# Patient Record
Sex: Female | Born: 1960
Health system: Southern US, Community
[De-identification: ages and names within clinical notes are randomized; demographics above are authoritative.]

## PROBLEM LIST (undated history)

## (undated) DIAGNOSIS — F419 Anxiety disorder, unspecified: Secondary | ICD-10-CM

## (undated) DIAGNOSIS — K589 Irritable bowel syndrome without diarrhea: Secondary | ICD-10-CM

## (undated) DIAGNOSIS — K449 Diaphragmatic hernia without obstruction or gangrene: Secondary | ICD-10-CM

## (undated) DIAGNOSIS — IMO0002 Reserved for concepts with insufficient information to code with codable children: Secondary | ICD-10-CM

## (undated) DIAGNOSIS — D126 Benign neoplasm of colon, unspecified: Secondary | ICD-10-CM

## (undated) DIAGNOSIS — T7840XA Allergy, unspecified, initial encounter: Secondary | ICD-10-CM

## (undated) DIAGNOSIS — J329 Chronic sinusitis, unspecified: Secondary | ICD-10-CM

## (undated) DIAGNOSIS — I10 Essential (primary) hypertension: Secondary | ICD-10-CM

## (undated) DIAGNOSIS — E669 Obesity, unspecified: Secondary | ICD-10-CM

## (undated) DIAGNOSIS — M1990 Unspecified osteoarthritis, unspecified site: Secondary | ICD-10-CM

## (undated) DIAGNOSIS — E785 Hyperlipidemia, unspecified: Secondary | ICD-10-CM

## (undated) DIAGNOSIS — K219 Gastro-esophageal reflux disease without esophagitis: Secondary | ICD-10-CM

## (undated) HISTORY — DX: Chronic sinusitis, unspecified: J32.9

## (undated) HISTORY — PX: POLYPECTOMY: SHX149

## (undated) HISTORY — DX: Anxiety disorder, unspecified: F41.9

## (undated) HISTORY — PX: COLONOSCOPY: SHX174

## (undated) HISTORY — DX: Gastro-esophageal reflux disease without esophagitis: K21.9

## (undated) HISTORY — DX: Essential (primary) hypertension: I10

## (undated) HISTORY — DX: Allergy, unspecified, initial encounter: T78.40XA

## (undated) HISTORY — DX: Diaphragmatic hernia without obstruction or gangrene: K44.9

## (undated) HISTORY — DX: Irritable bowel syndrome, unspecified: K58.9

## (undated) HISTORY — DX: Hyperlipidemia, unspecified: E78.5

## (undated) HISTORY — DX: Unspecified osteoarthritis, unspecified site: M19.90

## (undated) HISTORY — DX: Reserved for concepts with insufficient information to code with codable children: IMO0002

## (undated) HISTORY — DX: Benign neoplasm of colon, unspecified: D12.6

## (undated) HISTORY — DX: Obesity, unspecified: E66.9

---

## 2000-05-31 ENCOUNTER — Other Ambulatory Visit: Admission: RE | Admit: 2000-05-31 | Discharge: 2000-05-31 | Payer: Self-pay | Admitting: Obstetrics & Gynecology

## 2002-09-05 ENCOUNTER — Other Ambulatory Visit: Admission: RE | Admit: 2002-09-05 | Discharge: 2002-09-05 | Payer: Self-pay | Admitting: Obstetrics & Gynecology

## 2005-03-02 ENCOUNTER — Other Ambulatory Visit: Admission: RE | Admit: 2005-03-02 | Discharge: 2005-03-02 | Payer: Self-pay | Admitting: Family Medicine

## 2006-07-14 ENCOUNTER — Ambulatory Visit: Payer: Self-pay | Admitting: Gastroenterology

## 2006-08-29 ENCOUNTER — Other Ambulatory Visit: Admission: RE | Admit: 2006-08-29 | Discharge: 2006-08-29 | Payer: Self-pay | Admitting: Family Medicine

## 2006-09-13 ENCOUNTER — Ambulatory Visit: Payer: Self-pay | Admitting: Gastroenterology

## 2006-09-14 ENCOUNTER — Ambulatory Visit: Payer: Self-pay | Admitting: Gastroenterology

## 2006-09-14 ENCOUNTER — Encounter (INDEPENDENT_AMBULATORY_CARE_PROVIDER_SITE_OTHER): Payer: Self-pay | Admitting: *Deleted

## 2006-09-14 DIAGNOSIS — D126 Benign neoplasm of colon, unspecified: Secondary | ICD-10-CM | POA: Insufficient documentation

## 2006-10-06 ENCOUNTER — Ambulatory Visit: Payer: Self-pay | Admitting: Gastroenterology

## 2006-10-07 ENCOUNTER — Ambulatory Visit: Payer: Self-pay | Admitting: Gastroenterology

## 2006-10-07 DIAGNOSIS — K219 Gastro-esophageal reflux disease without esophagitis: Secondary | ICD-10-CM

## 2006-10-07 DIAGNOSIS — K449 Diaphragmatic hernia without obstruction or gangrene: Secondary | ICD-10-CM | POA: Insufficient documentation

## 2007-07-05 ENCOUNTER — Ambulatory Visit: Payer: Self-pay | Admitting: Cardiology

## 2007-07-30 ENCOUNTER — Emergency Department (HOSPITAL_COMMUNITY): Admission: EM | Admit: 2007-07-30 | Discharge: 2007-07-31 | Payer: Self-pay | Admitting: Emergency Medicine

## 2007-08-01 ENCOUNTER — Ambulatory Visit: Payer: Self-pay | Admitting: Gastroenterology

## 2007-08-24 ENCOUNTER — Ambulatory Visit: Payer: Self-pay | Admitting: Gastroenterology

## 2007-09-04 ENCOUNTER — Ambulatory Visit (HOSPITAL_COMMUNITY): Admission: RE | Admit: 2007-09-04 | Discharge: 2007-09-04 | Payer: Self-pay | Admitting: Gastroenterology

## 2007-09-05 ENCOUNTER — Ambulatory Visit: Payer: Self-pay | Admitting: Gastroenterology

## 2007-09-06 ENCOUNTER — Ambulatory Visit (HOSPITAL_COMMUNITY): Admission: RE | Admit: 2007-09-06 | Discharge: 2007-09-06 | Payer: Self-pay | Admitting: Gastroenterology

## 2007-10-05 HISTORY — PX: CHOLECYSTECTOMY: SHX55

## 2007-10-23 DIAGNOSIS — R0789 Other chest pain: Secondary | ICD-10-CM | POA: Insufficient documentation

## 2007-10-23 DIAGNOSIS — K589 Irritable bowel syndrome without diarrhea: Secondary | ICD-10-CM

## 2007-10-23 DIAGNOSIS — R1011 Right upper quadrant pain: Secondary | ICD-10-CM

## 2007-10-23 DIAGNOSIS — I10 Essential (primary) hypertension: Secondary | ICD-10-CM

## 2007-10-23 DIAGNOSIS — E669 Obesity, unspecified: Secondary | ICD-10-CM | POA: Insufficient documentation

## 2007-10-23 DIAGNOSIS — E1159 Type 2 diabetes mellitus with other circulatory complications: Secondary | ICD-10-CM

## 2007-10-23 DIAGNOSIS — E785 Hyperlipidemia, unspecified: Secondary | ICD-10-CM | POA: Insufficient documentation

## 2010-02-25 ENCOUNTER — Ambulatory Visit: Admission: RE | Admit: 2010-02-25 | Discharge: 2010-02-25 | Payer: Self-pay | Admitting: Family Medicine

## 2010-02-25 ENCOUNTER — Ambulatory Visit: Payer: Self-pay | Admitting: Vascular Surgery

## 2010-06-29 ENCOUNTER — Ambulatory Visit: Admission: RE | Admit: 2010-06-29 | Discharge: 2010-06-29 | Payer: Self-pay | Admitting: Family Medicine

## 2010-06-29 ENCOUNTER — Ambulatory Visit: Payer: Self-pay | Admitting: Vascular Surgery

## 2010-10-25 ENCOUNTER — Encounter: Payer: Self-pay | Admitting: Gastroenterology

## 2010-10-25 ENCOUNTER — Encounter: Payer: Self-pay | Admitting: Family Medicine

## 2010-12-07 ENCOUNTER — Other Ambulatory Visit: Payer: Self-pay | Admitting: Dermatology

## 2011-02-16 NOTE — Assessment & Plan Note (Signed)
[facility]                            CARDIOLOGY OFFICE NOTE   NAME:[name], [name]                          MRN:          [id number]  DATE:07/05/2007                            DOB:          12-27-1960    PRIMARY CARE PHYSICIAN:  Ernestina Penna, M.D.   REASON FOR PRESENTATION:  Evaluate patient with chest discomfort and  abnormal Cardiolite.   HISTORY OF PRESENT ILLNESS:  The patient presents for evaluation of the  above. She has had some chest discomfort since September. She has been  under a lot of stress. Her husband recently had bypass surgery. She says  she ate a lot of spicy foods on that day and developed some discomfort  under her right breast. She has had this intermittently before, but very  mild. This was somewhat severe. It went away spontaneously. She has had  it very mildly since then. She belches and it goes away. She is not  particularly active though she started walking with her husband in his  recovery from surgery. She also vacuums the house. She is not able to  bring on any of these symptoms with activities. She does not have any  substernal pressure or radiation to her jaw or arms. This discomfort she  gets under her right breast is more burning discomfort. There is no  associated diaphoresis or other symptoms. She did an exercise treadmill  test. She exercised for 7 minutes and 25 seconds. She achieved the  target heart rate and maximum heart rate of 180. She had an appropriate,  but slightly accelerated blood pressure response. There was some minimal  ST segment depression at peak exercise. However, this was not sustained  in concurrent leads. There was significant artifact. For the most part  this looked like an upsloping depression. I do not read this as a  positive result. The most prominent was in V6 and in AVF. It was not in  contiguous leads. There were no symptoms associated with this.   PAST MEDICAL HISTORY:  1.  Hypertension x3 years.  2. Wisdom teeth extracted.   ALLERGIES:  None.   CURRENT MEDICATIONS:  1. Altace 5 mg daily.  2. Aspirin 81 mg daily.  3. Aciphex 20 mg b.i.d.  4. Lovaza.   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  She is married. She has one 41 -year-old son. She smoked briefly, but  quit in 1985.   FAMILY HISTORY:  Is remarkable for her father having myocardial  infarction at age 68. Her mother had problems with diabetes and died at  age 37 of kidney failure.   REVIEW OF SYSTEMS:  As stated in the HPI and positive for reflux,  rhinitis. Negative for other systems.   PHYSICAL EXAMINATION:  The patient is anxious, but in no distress.  Weight 220 pounds. Body Mass Index is 35. Blood pressure 138/94. Heart  rate 97 and regular.  HEENT: Eyelids unremarkable. Pupils equal, round, and reactive to light.  Fundi not visualized. Oral mucosa is unremarkable.  NECK: No jugular  venous distention, waveform within normal limits.  Carotid upstroke brisk and symmetrical. No bruits, no thyromegaly.  LYMPHATICS: No cervical, axillary or inguinal adenopathy.  LUNGS: Clear to auscultation bilaterally.  BACK: No costovertebral angle tenderness.  CHEST: Unremarkable.  HEART: PMI not displaced or sustained. S1, S2 within normal limits. No  S3. No S4. No clicks, rub or murmurs.  ABDOMEN: Obese, positive bowel sounds, normal in frequency and pitch. No  bruits. No rebounds. No guarding. No midline pulsatile mass. No  hepatomegaly, splenomegaly.  SKIN: No rashes, no nodules.  EXTREMITIES: 2+ pulses throughout. No cyanosis or clubbing. No edema.  NEURO: Oriented to person, place and time. Cranial nerves II-XII grossly  intact. Motor grossly intact throughout.   EKG: Sinus rhythm. Rate 97. Axis within normal limits. Intervals within  normal limits. Poor anterior R-wave progression. No acute ST-T wave  changes.   ASSESSMENT/PLAN:  1. Chest discomfort. The  patient's chest discomfort is very atypical.      Her stress does not meet criteria for evidence of high-grade      obstructive coronary artery disease. She is in intermediate risk      for future cardiovascular events because of her accelerated heart      rate response and poor exercise tolerance. However, I do not see      any indication for further cardiovascular testing. She should      continue with aggressive primary risk reduction.  2. Hypertension. Blood pressure is controlled by medications as listed      though there was an accelerated blood pressure response with      exercise. Weight loss and therapeutic lifestyle changes should be a      strong component of her blood pressure regimen.  3. Dyslipidemia. She is having this followed by Dr.  Christell Constant.  4. Obesity. She understands the need to lose weight with diet and      exercise and I applaud her efforts and success to date.  5. Followup will be back in this clinic as needed.     Rollene Rotunda, MD, [facility]  Electronically Signed    JH/MedQ  DD: 07/05/2007  DT: 07/05/2007  Job #: [id number]   cc:   Ernestina Penna, M.D.

## 2011-02-16 NOTE — Assessment & Plan Note (Signed)
[facility]                         GASTROENTEROLOGY OFFICE NOTE   NAME:[name], [name]                          MRN:          [id number]  DATE:08/24/2007                            DOB:          [birth year]    [name] is completely asymptomatic on twice a day PPI therapy and liquid  Carafate.  She now says that her father has achalasia and I have decided  to go ahead and check a barium swallow on her to exclude underlying  esophageal motility disorder for the difficulty we have had in treating  her recurrent chest pain and reflux symptoms.  I have asked her to  continue on her current regimen till this can be completed.  I would  have set her up for manometry because she was somewhat combative during  the endoscopy and I doubt she would be able to tolerate this procedure.  I will see her back for followup once her barium swallow/upper GI series  has been completed.     [name], MD, [name], [name]  Electronically Signed    DRP/MedQ  DD: 08/24/2007  DT: 08/24/2007  Job #: [id number]

## 2011-02-16 NOTE — Assessment & Plan Note (Signed)
[name]                         GASTROENTEROLOGY OFFICE NOTE   [name], [name]                          MRN:          [id number]  DATE:08/01/2007                            DOB:          May 14, 1961    [name] has continued to have atypical chest pain and has had several  emergency room visits.  She has had thorough workup including CT  scanning, CCK HIDA scanning, ultrasound, and cardiac workup per Dr. Angelina Sheriff.  All of these have been normal.   Describes episodes of full sensation in subxiphoid area with burning in  her chest with associated spasm.  No real precipitating or alleviating  elements to her pain.  She has voluntarily lost about 25 pounds in  weight over the last year.  She denies any specific hepatobiliary or  lower GI complaints.  1. She did have rather severe constipation and was taking p.r.n.      Senna.  Previous treatments with Zelnorm were not effective.  2. She takes Altace 10 mg a day for hypertension.  3. Aciphex 20 mg twice a day.  4. Aspirin 81 mg a day.  5. Magnesium 750 mg a day.   The patient specifically denies dysphagia or nocturnal awakening.  Review of recent lab data shows normal CBC and metabolic profile  including thyroid function tests.   PHYSICAL EXAMINATION:  GENERAL:  She is a healthy-appearing white female  in no distress, appearing her stated age.  VITAL SIGNS:  She weighs 221 pounds.  Blood pressure 128/84.  Pulse was  64 and regular.  HEENT:  I could not appreciate stigmata of chronic liver disease.  CHEST:  Entirely clear.  HEART:  Regular rhythm without murmurs, rubs, or gallops.  ABDOMEN:  There was no hepatosplenomegaly, abdominal masses, or  tenderness. Bowel sounds were normal.  EXTREMITIES:  Extremities were unremarkable.  NEUROLOGIC:  Mental status was clear.   ASSESSMENT:  [name] has very atypical chest pain, but I suspect she does  have an esophageal source for her pain.  She most  likely is having acid  reflux with a bilious component.  Other consideration would be primary  esophageal spasm.   RECOMMENDATIONS:  I have decided to continue [name] on a very strict  anti-reflux regimen along with twice-a-day Aciphex.  We will add  Carafate suspension 1 tablespoon (1 gram) 20 minutes after meals and at  bedtime with p.r.n. sublingual Levsin 0.125 mg use.   Will see her back in a few weeks.  If she is still symptomatic, will  proceed with esophageal monometry at 24-hour pH testing off of therapy  for diagnostic purposes.     [name], [name], [name], [name]  Electronically Signed    DRP/MedQ  DD: 08/01/2007  DT: 08/01/2007  Job #: [id number]   cc:   [name], M.D.  [name], [name], [name]

## 2011-02-16 NOTE — Assessment & Plan Note (Signed)
[facility]                         GASTROENTEROLOGY OFFICE NOTE   NAME:[name], [name]                          MRN:          [id number]  DATE:09/05/2007                            DOB:          10/02/1961    The patient has had recurrent right upper quadrant pain with nausea,  refractory to all medical therapy.  I did an upper GI series for a  family history of achalasia and this was essentially normal except for a  small hiatal hernia.   PHYSICAL EXAMINATION:  VITAL SIGNS:  Today all normal.  ABDOMEN:  Tenderness over her right costochondral margin area without  definite organomegaly, other masses, or tenderness.   ASSESSMENT:  I think the patient most likely has musculoskeletal right  upper quadrant pain and chronic Tietze's syndrome.  Evidence of GI  pathology has been minimal.   RECOMMENDATIONS:  1. Continue current medications.  2. Stat CCK-Hida scan repeat.  3. Tylox one every 6-8 hours as needed for pain.  4. Zofran 4 mg every 12 hours as needed for nausea.  5. Follow-up depending on clinical course and results.     [name], MD, [name], [name]  Electronically Signed    DRP/MedQ  DD: 09/05/2007  DT: 09/05/2007  Job #: [id number]   cc:   Ernestina Penna, M.D.  Rollene Rotunda, MD, [facility]

## 2011-02-19 NOTE — Letter (Signed)
October 06, 2006    Ernestina Penna, M.D.  [address] [place]  [place] [address]   RE:  [name], SHERIDAN  MRN:  [id number]  /  DOB:  [birth year]   Dear Roe Coombs:   Bjorn Loser continues to have some vague discomfort which now is located in  the epigastric area radiating to her back. It does not occur every day.  Is really not associated with any specific precipitating or alleviating  elements.  She has had a rather thorough workup including an ultrasound  and CCK, HIDA scanning, both of which were normal. Her colonoscopy  showed an adenomatous polyp that was excised, but otherwise was  unremarkable except for a long and redundant and very tortuous colon.  It has been my feeling that she has a floppy cecum and this has  probably accounted for her abdominal pain.  I have given her a script to  take to the emergency room for a flat and upright KUB, should she  develop acute abdominal pain.  She currently is taking Senokot daily,  has had improvement in her bowel movements.   Physical examination is entirely unremarkable except for some tenderness  in the epigastric area, and I have scheduled her for endoscopy to  complete her workup.  We have decided to try a repeat trial of Amitiza  24 mcg twice a day with meals in the interim.   I think we will be able to get Talaysha back on an even keel.  Hopefully a  lot of her problems will resolve with normal bowel movement patterns in  the future.  Thank you for allowing me to see this most interesting and  pleasant patient.    Sincerely,      [name], MD, [name], [name]  Electronically Signed    DRP/MedQ  DD: 10/06/2006  DT: 10/06/2006  Job #: [id number]

## 2011-02-19 NOTE — Assessment & Plan Note (Signed)
[facility]                           GASTROENTEROLOGY OFFICE NOTE   NAME:Schupp, Johnasia                          MRN:          [id number]  DATE:07/14/2006                            DOB:          1961-07-20    HISTORY:  Mrs. Cai is a 50 year old white female referred for evaluation of  right upper quadrant pain which has been a problem over the last several  months.   She apparently has had right upper quadrant pain radiating to her right  shoulder into her back with a negative ultrasound and CT scan of her abdomen  and these were reviewed today.  She has chronic functional constipation, had  previous colonoscopy in November 2004 which showed a very long, redundant,  tortuous colon and with a very difficult exam.  Recent KUB and CAT scans  confirmed a floppy cecum with her cecum at times being in her left lower  quadrant area.  She does have mild obesity but has had normal liver function  tests and ultrasound showed no evidence of cholelithiasis but does confirm  mild fatty infiltration of her liver.  She also has now a little bit of  musculoskeletal pain and her right back pain seems to be associated with  typing at her work.  She has had no real reflux symptoms, dysphagia, or  specific hepatobiliary complaints such as clay-colored stools, dark urine,  icterus, fever or chills, anorexia or weight loss.   Only medications at this time are Altace 5 mg a day, Senokot daily, p.r.n.  trimethoprim.  SHE DENIES ALLERGIES EXCEPT TO MYCIN DRUGS.  She does have  essential hypertension.  Family history is noncontributory except for a half  sister with leukemia.  There are no known gastrointestinal problems.   PHYSICAL EXAMINATION:  GENERAL:  She is a healthy-appearing white female  appearing her stated age.  VITAL SIGNS:  She weighs 232 pounds, her blood pressure is 130/66.  Pulse  was 76 and regular.  CHEST:  Her chest was clear and I could not appreciate  murmurs, gallops or  rubs.  She appeared to be in a regular rhythm.  ABDOMEN:  I could not appreciate hepatosplenomegaly, abdominal masses or  significant tenderness.  There was no CVA tenderness.  Bowel sounds were  normal.   ASSESSMENT:  1. Right upper quadrant pain probably from a combination of sources      including fatty infiltration of the liver, musculoskeletal components,      and gas pattern associated with her constipation predominant irritable      bowel syndrome.  2. Chronic irritable bowel syndrome with previous good response to      Zelnorm.  She said she tried Amitiza for a week without improvement.  3. Mobile cecum with a floppy-cecum syndrome.  I suspect she may be a      candidate for cecal volvulus at some point in the future.  Her bowels      are doing well now daily, on daily Senokot.  4. Well-controlled essential hypertension.   RECOMMENDATIONS:  1. Continue Senokot and liberal p.o. fluids.  2. Try Amitiza 24 mcg twice a day for [redacted] weeks along with Senna to see how      she does symptomatically.  3. Consider CCK-HIDA scan of her gallbladder depending on clinical course.  4. Other medications as per Dr. Christell Constant.  5. Followup colonoscopy in 2 years' time or sooner should her problems      worsen.       [name], MD, [facility], [place]      DRP/MedQ  DD:  07/14/2006  DT:  07/15/2006  Job #:  [id number]   cc:   Ernestina Penna, M.D.

## 2011-02-19 NOTE — Assessment & Plan Note (Signed)
[facility]                         GASTROENTEROLOGY OFFICE NOTE   NAME:[name], [name]                          MRN:          [id number]  DATE:09/13/2006                            DOB:          [birth year]    [name] had an episode of rather acute lower abdominal pain this past  weekend, mostly right lower quadrant, with nausea but no emesis or  change in her bowel habits.  She saw Dr. Rudi Heap, had an elevated  white count of 16,500 with a slight left shift, and was treated with  Cipro 500 mg three times a day for 10 days, and is feeling better  symptomatically.  She had CCK HIDA scan today at [facility] that  was normal.  She still had some vague right lower quadrant discomfort,  but is generally feeling better.  She is having no nausea and vomiting  or change in bowel habits, or any other genitourinary or other  gastrointestinal symptoms.   I previously saw [name] in October of this year, and at that time she  gave a history of having had a floppy-cecum that was seen on CT scan  of the abdomen that was done on May 24, 2006.  At that time she was  having right flank pain and hematuria, and the CT scan was entirely  normal except for a redundant cecum reported in her report.  The CT scan  also showed no inflammatory changes in the pelvis or abdomen, or bowel  wall thickening or any pelvic process.   I had previously seen this patient in September of 2004 with right upper  quadrant pain, and at that time she had a negative ultrasound of the  upper abdomen, and also a negative colonoscopy exam.  She had a very  long and redundant and tortuous colon report at that time, and a small 4  mm polyp that was excised.  When I saw her in October of this year she  was markedly constipated.  She tried Amitiza without improvement, but  she did get good relief with Senokot.  She recently discontinued Senokot  because she felt that it was not  working.   I cannot get any definite upper GI or hepatobiliary complaints at this  time.  She denies any cardiopulmonary complaints.  Her appetite is good  and her weight is stable.  She has not really eaten solid foods in the  last several days.   MEDICATIONS:  Her only medications at this time are:  1. Altace 5 mg a day.  2. Cipro.   ALLERGIES:  She in the past has had REACTION TO MYCIN DRUGS.   She denies recent abdominal trauma, gynecologic problems, melena or  hematochezia.  She does have essential hypertension.   PHYSICAL EXAMINATION:  Shows her to be a healthy white female in no  acute distress, appearing her stated age.  She weighs 229 pounds, blood pressure 120/80 and pulse was 76 and  regular.  She was afebrile at this time.  CHEST:  Clear.  CARDIAC:  Exam was unremarkable.  She was in a  regular rhythm.  I could not appreciate hepatosplenomegaly or significant tenderness at  this time, although she had some subjective tenderness over her entire  colonic area.  Bowel sounds were normal.  PERIPHERAL EXTREMITIES:  Unremarkable.  MENTAL STATUS:  Clear.  RECTAL:  Exam was deferred at this time.   ASSESSMENT:  It is certainly possible that [name] has had intermittent  cecal volvulus from lack of mesenteric attachment of her cecum in the  right lower quadrant area.  Other considerations would be recurrent  ischemic bowel difficulties.  It has really been over 3 years since we  evaluated her colon.  She definitely has chronic functional  constipation.   RECOMMENDATIONS:  1. Colonoscopy tomorrow with GoLYTELY colon prep tonight.  2. Check further screening laboratory parameters.  3. Consider surgical referral for their opinion and perhaps repair of      her problem.  4. Continue other medications as per Dr. Christell Constant.     [name]. Jarold Motto, MD, [name], [name]  Electronically Signed    DRP/MedQ  DD: 09/13/2006  DT: 09/14/2006  Job #: [id number]   cc:   Ernestina Penna,  M.D.

## 2011-02-19 NOTE — Letter (Signed)
September 07, 2007    Lorne Skeens. Hoxworth, M.D.  [address], [address]  [place], [place] [address]   RE:  [name], [name]  MRN:  [id number]  /  DOB:  [birth year]   Dear Romeo Apple:   I would appreciate it if you could see Heylee Monterrosa for consideration of  laparoscopic cholecystectomy.   She is an extremely nice, middle-aged white female who has had rather  severe, recurrent, right upper quadrant pain of unexplained etiology.  I  have been seeing her for several years and it certainly sounds like she  has been having recurrent episodes of biliary colic.  She has had a  negative ultrasound and recent CCK-HIDA scan on September 06, 2007.  It  showed 53% ejection fraction, normal greater than 50.  This is certainly  a borderline result to say the least.  Because of her continued  complaints with a negative, otherwise, GI workup, I really think we  should consider laparoscopic cholecystectomy in her [place].  She has been  treated for acid reflux with PPI therapy and Carafate.  She does have  chronic irritable bowel syndrome which seems to be doing well at this  time.  Her primary care doctor is Dr. Rudi Heap in [place].    Sincerely,      [name], MD, [name], [name]  Electronically Signed    DRP/MedQ  DD: 09/08/2007  DT: 09/08/2007  Job #: [id number]   CC:    Ernestina Penna, M.D.

## 2011-03-24 ENCOUNTER — Other Ambulatory Visit: Payer: Self-pay | Admitting: Family Medicine

## 2011-03-24 ENCOUNTER — Ambulatory Visit (HOSPITAL_COMMUNITY)
Admission: RE | Admit: 2011-03-24 | Discharge: 2011-03-24 | Disposition: A | Discharge status: Home / Self Care | Payer: 59 | Source: Ambulatory Visit | Attending: Family Medicine | Admitting: Family Medicine

## 2011-03-24 DIAGNOSIS — R1011 Right upper quadrant pain: Secondary | ICD-10-CM | POA: Insufficient documentation

## 2011-03-24 DIAGNOSIS — K7689 Other specified diseases of liver: Secondary | ICD-10-CM | POA: Insufficient documentation

## 2011-03-24 DIAGNOSIS — N289 Disorder of kidney and ureter, unspecified: Secondary | ICD-10-CM | POA: Insufficient documentation

## 2011-03-24 DIAGNOSIS — Z9089 Acquired absence of other organs: Secondary | ICD-10-CM | POA: Insufficient documentation

## 2011-03-24 MED ORDER — IOHEXOL 300 MG/ML  SOLN
100.0000 mL | Freq: Once | INTRAMUSCULAR | Status: AC | PRN
  Administered 2011-03-24: 100 mL via INTRAVENOUS

## 2011-07-14 LAB — COMPREHENSIVE METABOLIC PANEL
ALT: 14
AST: 15
Alkaline Phosphatase: 41
CO2: 25
Chloride: 105
Creatinine, Ser: 0.79
GFR calc Af Amer: >60
GFR calc non Af Amer: >60
Potassium: 3.4 — ABNORMAL LOW
Total Bilirubin: 0.7

## 2011-12-02 ENCOUNTER — Encounter: Payer: Self-pay | Admitting: Gastroenterology

## 2012-05-12 ENCOUNTER — Ambulatory Visit (AMBULATORY_SURGERY_CENTER): Payer: 59 | Admitting: *Deleted

## 2012-05-12 VITALS — Ht 67.5 in | Wt 240.0 lb

## 2012-05-12 DIAGNOSIS — Z1211 Encounter for screening for malignant neoplasm of colon: Secondary | ICD-10-CM

## 2012-05-12 MED ORDER — MOVIPREP 100 G PO SOLR: ORAL | Status: DC

## 2012-05-26 ENCOUNTER — Ambulatory Visit (AMBULATORY_SURGERY_CENTER): Payer: 59 | Admitting: Gastroenterology

## 2012-05-26 ENCOUNTER — Encounter: Payer: Self-pay | Admitting: Gastroenterology

## 2012-05-26 VITALS — BP 146/112 | HR 96 | Temp 99.0°F | Resp 20 | Ht 67.0 in | Wt 240.0 lb

## 2012-05-26 DIAGNOSIS — Z1211 Encounter for screening for malignant neoplasm of colon: Secondary | ICD-10-CM

## 2012-05-26 DIAGNOSIS — D126 Benign neoplasm of colon, unspecified: Secondary | ICD-10-CM

## 2012-05-26 DIAGNOSIS — R1011 Right upper quadrant pain: Secondary | ICD-10-CM

## 2012-05-26 DIAGNOSIS — K5730 Diverticulosis of large intestine without perforation or abscess without bleeding: Secondary | ICD-10-CM

## 2012-05-26 MED ORDER — SODIUM CHLORIDE 0.9 % IV SOLN: 500.0000 mL | INTRAVENOUS | Status: DC

## 2012-05-26 NOTE — Patient Instructions (Addendum)
YOU HAD AN ENDOSCOPIC PROCEDURE TODAY AT THE [facility]: Refer to the procedure report that was given to you for any specific questions about what was found during the examination.  If the procedure report does not answer your questions, please call your gastroenterologist to clarify.  If you requested that your care partner not be given the details of your procedure findings, then the procedure report has been included in a sealed envelope for you to review at your convenience later.  YOU SHOULD EXPECT: Some feelings of bloating in the abdomen. Passage of more gas than usual.  Walking can help get rid of the air that was put into your GI tract during the procedure and reduce the bloating. If you had a lower endoscopy (such as a colonoscopy or flexible sigmoidoscopy) you may notice spotting of blood in your stool or on the toilet paper. If you underwent a bowel prep for your procedure, then you may not have a normal bowel movement for a few days.  DIET: Your first meal following the procedure should be a light meal and then it is ok to progress to your normal diet.  A half-sandwich or bowl of soup is an example of a good first meal.  Heavy or fried foods are harder to digest and may make you feel nauseous or bloated.  Likewise meals heavy in dairy and vegetables can cause extra gas to form and this can also increase the bloating.  Drink plenty of fluids but you should avoid alcoholic beverages for 24 hours.  ACTIVITY: Your care partner should take you home directly after the procedure.  You should plan to take it easy, moving slowly for the rest of the day.  You can resume normal activity the day after the procedure however you should NOT DRIVE or use heavy machinery for 24 hours (because of the sedation medicines used during the test).    SYMPTOMS TO REPORT IMMEDIATELY: A gastroenterologist can be reached at any hour.  During normal business hours, 8:30 AM to 5:00 PM Monday through Friday,  call [phone].  After hours and on weekends, please call the GI answering service at [phone] who will take a message and have the physician on call contact you.   Following lower endoscopy (colonoscopy or flexible sigmoidoscopy):  Excessive amounts of blood in the stool  Significant tenderness or worsening of abdominal pains  Swelling of the abdomen that is new, acute  Fever of 100F or higher    FOLLOW UP: If any biopsies were taken you will be contacted by phone or by letter within the next 1-3 weeks.  Call your gastroenterologist if you have not heard about the biopsies in 3 weeks.  Our staff will call the home number listed on your records the next business day following your procedure to check on you and address any questions or concerns that you may have at that time regarding the information given to you following your procedure. This is a courtesy call and so if there is no answer at the home number and we have not heard from you through the emergency physician on call, we will assume that you have returned to your regular daily activities without incident.  SIGNATURES/CONFIDENTIALITY: You and/or your care partner have signed paperwork which will be entered into your electronic medical record.  These signatures attest to the fact that that the information above on your After Visit Summary has been reviewed and is understood.  Full responsibility of the confidentiality   of this discharge information lies with you and/or your care-partner.     

## 2012-05-26 NOTE — Op Note (Signed)
[facility] [address] [place], [address]   COLONOSCOPY PROCEDURE REPORT  PATIENT: Badger, Saharah  MR#: [id number] BIRTHDATE: [birth year] , 51  yrs. old GENDER: Female ENDOSCOPIST: Mardella Layman, MD, [facility] REFERRED BY: PROCEDURE DATE:  05/26/2012 PROCEDURE:   Colonoscopy, surveillance and Colonoscopy with snare polypectomy ASA CLASS:   Class II INDICATIONS:abdominal pain in the lower right quadrant and follow up of colonic polyps. MEDICATIONS: Propofol (Diprivan) and Propofol (Diprivan) 600 mg IV   DESCRIPTION OF PROCEDURE:   After the risks and benefits and of the procedure were explained, informed consent was obtained.  A digital rectal exam revealed no abnormalities of the rectum.    The LB [id number]  endoscope was introduced through the anus and advanced to the cecum, which was identified by both the appendix and ileocecal valve .  The quality of the prep was excellent, using MoviPrep .  The instrument was then slowly withdrawn as the colon was fully examined.     COLON FINDINGS: The colon was redundant.   Moderate diverticulosis was noted in the descending colon and sigmoid colon.   A smooth sessile polyp ranging between 5-7mm in size with a mucous cap was found in the ascending colon.  A polypectomy was performed using snare cautery.  The resection was complete and the polyp tissue was completely retrieved.   A smooth and firm flat polyp ranging between 3-63mm in size was found in the sigmoid colon.  Polypectomy was performed using hot snare.  All resections were complete and all polyp tissue was completely retrieved.            The scope was then withdrawn from the patient and the procedure completed.  COMPLICATIONS: There were no complications. ENDOSCOPIC IMPRESSION: 1.   The colon was redundant 2.   Moderate diverticulosis was noted in the descending colon and sigmoid colon 3.   Sessile polyp ranging between 5-43mm in size was found  in the ascending colon; polypectomy was performed using snare cautery 4.   Flat polyp ranging between 3-57mm in size was found in the sigmoid colon; Polypectomy was performed using hot snare  RECOMMENDATIONS: 1.  Repeat colonoscopy in 5 years if polyp adenomatous; otherwise 10 years 2.  High fiber diet   REPEAT EXAM:  XB:JYNWGN Christell Constant, MD  _______________________________ eSigned:  Mardella Layman, MD, [facility] 05/26/2012 2:58 PM     PATIENT NAME:  Runyan, Avigail MR#: [id number]

## 2012-05-26 NOTE — Progress Notes (Signed)
Patient did not have preoperative order for IV antibiotic SSI prophylaxis. (G8918)  Patient did not experience any of the following events: a burn prior to discharge; a fall within the facility; wrong site/side/patient/procedure/implant event; or a hospital transfer or hospital admission upon discharge from the facility. (G8907)  

## 2012-05-29 ENCOUNTER — Telehealth: Payer: Self-pay | Admitting: *Deleted

## 2012-05-29 NOTE — Telephone Encounter (Signed)
  Follow up Call-  Call back number 05/26/2012  Post procedure Call Back phone  # [phone]  Permission to leave phone message Yes     [facility]

## 2012-06-02 ENCOUNTER — Encounter: Payer: Self-pay | Admitting: Gastroenterology

## 2012-12-02 ENCOUNTER — Encounter (HOSPITAL_COMMUNITY): Payer: Self-pay | Admitting: *Deleted

## 2012-12-02 ENCOUNTER — Emergency Department (HOSPITAL_COMMUNITY)
Admission: EM | Admit: 2012-12-02 | Discharge: 2012-12-03 | Disposition: A | Discharge status: Home / Self Care | Payer: 59 | Attending: Emergency Medicine | Admitting: Emergency Medicine

## 2012-12-02 DIAGNOSIS — R350 Frequency of micturition: Secondary | ICD-10-CM | POA: Insufficient documentation

## 2012-12-02 DIAGNOSIS — Z79899 Other long term (current) drug therapy: Secondary | ICD-10-CM | POA: Insufficient documentation

## 2012-12-02 DIAGNOSIS — R3915 Urgency of urination: Secondary | ICD-10-CM | POA: Insufficient documentation

## 2012-12-02 DIAGNOSIS — I10 Essential (primary) hypertension: Secondary | ICD-10-CM | POA: Insufficient documentation

## 2012-12-02 DIAGNOSIS — Z872 Personal history of diseases of the skin and subcutaneous tissue: Secondary | ICD-10-CM | POA: Insufficient documentation

## 2012-12-02 DIAGNOSIS — N12 Tubulo-interstitial nephritis, not specified as acute or chronic: Secondary | ICD-10-CM

## 2012-12-02 DIAGNOSIS — R300 Dysuria: Secondary | ICD-10-CM | POA: Insufficient documentation

## 2012-12-02 DIAGNOSIS — E119 Type 2 diabetes mellitus without complications: Secondary | ICD-10-CM | POA: Insufficient documentation

## 2012-12-02 DIAGNOSIS — Z7982 Long term (current) use of aspirin: Secondary | ICD-10-CM | POA: Insufficient documentation

## 2012-12-02 NOTE — ED Notes (Signed)
Patient is alert and oriented x3.  She is complaining of flank pain that started at 5pm today that is radiating to her right groin area. Currently she rates her pain 8 of 10 with the maximum area of pain in the right groin area.   She states that she does have a history of pyelonephritis.

## 2012-12-03 LAB — CBC WITH DIFFERENTIAL/PLATELET
Basophils Absolute: 0.1 10*3/uL (ref 0.0–0.1)
Eosinophils Relative: 2 % (ref 0–5)
Lymphocytes Relative: 17 % (ref 12–46)
Lymphs Abs: 2.5 10*3/uL (ref 0.7–4.0)
Neutro Abs: 10.8 10*3/uL — ABNORMAL HIGH (ref 1.7–7.7)
Neutrophils Relative %: 75 % (ref 43–77)
Platelets: 305 10*3/uL (ref 150–400)
RBC: 4.41 MIL/uL (ref 3.87–5.11)
RDW: 13.6 % (ref 11.5–15.5)
WBC: 14.5 10*3/uL — ABNORMAL HIGH (ref 4.0–10.5)

## 2012-12-03 LAB — COMPREHENSIVE METABOLIC PANEL
AST: 13 U/L (ref 0–37)
Albumin: 3.9 g/dL (ref 3.5–5.2)
BUN: 7 mg/dL (ref 6–23)
Chloride: 96 mEq/L (ref 96–112)
Creatinine, Ser: 0.62 mg/dL (ref 0.50–1.10)
Potassium: 3.5 mEq/L (ref 3.5–5.1)
Total Protein: 7.5 g/dL (ref 6.0–8.3)

## 2012-12-03 LAB — URINE MICROSCOPIC-ADD ON

## 2012-12-03 LAB — URINALYSIS, ROUTINE W REFLEX MICROSCOPIC
Nitrite: NEGATIVE
Protein, ur: 30 mg/dL — AB
Specific Gravity, Urine: 1.004 — ABNORMAL LOW (ref 1.005–1.030)
Urobilinogen, UA: 0.2 mg/dL (ref 0.0–1.0)

## 2012-12-03 MED ORDER — DEXTROSE 5 % IV SOLN
1.0000 g | INTRAVENOUS | Status: DC
  Administered 2012-12-03: 1 g via INTRAVENOUS
  Filled 2012-12-03: qty 10

## 2012-12-03 MED ORDER — FENTANYL CITRATE 0.05 MG/ML IJ SOLN
50.0000 ug | Freq: Once | INTRAMUSCULAR | Status: AC
  Administered 2012-12-03: 50 ug via INTRAVENOUS
  Filled 2012-12-03: qty 2

## 2012-12-03 MED ORDER — SODIUM CHLORIDE 0.9 % IV BOLUS (SEPSIS)
1000.0000 mL | Freq: Once | INTRAVENOUS | Status: AC
  Administered 2012-12-03: 1000 mL via INTRAVENOUS

## 2012-12-03 MED ORDER — HYDROCODONE-ACETAMINOPHEN 5-325 MG PO TABS: 1.0000 | ORAL_TABLET | ORAL | Status: DC | PRN

## 2012-12-03 MED ORDER — CEFIXIME 400 MG PO TABS: 400.0000 mg | ORAL_TABLET | Freq: Every day | ORAL | Status: DC

## 2012-12-03 MED ORDER — MORPHINE SULFATE 4 MG/ML IJ SOLN
4.0000 mg | Freq: Once | INTRAMUSCULAR | Status: AC
  Administered 2012-12-03: 4 mg via INTRAVENOUS
  Filled 2012-12-03: qty 1

## 2012-12-03 MED ORDER — ONDANSETRON HCL 4 MG/2ML IJ SOLN
4.0000 mg | Freq: Once | INTRAMUSCULAR | Status: AC
Start: 2012-12-03 — End: 2012-12-03
  Administered 2012-12-03: 4 mg via INTRAVENOUS
  Filled 2012-12-03: qty 2

## 2012-12-03 NOTE — ED Provider Notes (Signed)
Medical screening examination/treatment/procedure(s) were performed by non-physician practitioner and as supervising physician I was immediately available for consultation/collaboration. Devoria Albe, MD, FACEP   Ward Givens, MD 12/03/12 [phone]

## 2012-12-03 NOTE — ED Provider Notes (Signed)
History     CSN: [id number]  Arrival date & time 12/02/12  2307   First MD Initiated Contact with Patient 12/03/12 0106      Chief Complaint  Patient presents with  . Flank Pain    right side   HPI  History provided by the patient. Patient is a 52 year old female with history of hypertension, diabetes who presents with complaints of suprapubic and right-sided abdominal and back pain. Symptoms have been associated with some dysuria, urinary frequency and urgency. They began 2 days ago and have progressively worsened. Patient also reports past history of pyelonephritis and symptoms are similar. She denies any associated fever but reports some occasional subjective chills. Denies any nausea vomiting. No other aggravating or alleviating factors. No other associated symptoms.    Past Medical History  Diagnosis Date  . Allergy     seasonal  . Diabetes mellitus     type ii  . GERD (gastroesophageal reflux disease)   . Hypertension   . Ulcer     Past Surgical History  Procedure Laterality Date  . Cholecystectomy  2009    Family History  Problem Relation Age of Onset  . Diabetes Mother   . Kidney disease Mother   . Colitis Father   . Colon polyps Father   . Heart disease Father   . Inflammatory bowel disease Father   . Irritable bowel syndrome Father   . Crohn's disease Sister   . Colitis Sister   . Colon polyps Sister   . Inflammatory bowel disease Sister   . Irritable bowel syndrome Sister   . Diabetes Maternal Aunt   . Diabetes Paternal Aunt   . Diabetes Maternal Grandmother   . Diabetes Paternal Grandmother   . Heart disease Paternal Grandmother   . Colitis Paternal Grandfather     History  Substance Use Topics  . Smoking status: Never Smoker   . Smokeless tobacco: Never Used  . Alcohol Use: Yes     Comment: 1-2 glasses of wine a month    OB History   Grav Para Term Preterm Abortions TAB SAB Ect Mult Living                  Review of Systems   Constitutional: Positive for chills. Negative for fever.  Respiratory: Negative for cough.   Cardiovascular: Negative for chest pain.  Gastrointestinal: Positive for nausea and abdominal pain. Negative for vomiting, diarrhea and constipation.  Genitourinary: Positive for dysuria, urgency, frequency and flank pain. Negative for hematuria.  Musculoskeletal: Positive for back pain.  All other systems reviewed and are negative.    Allergies  Erythromycin; Codeine; and Sulfa antibiotics  Home Medications   Current Outpatient Rx  Name  Route  Sig  Dispense  Refill  . aspirin EC 81 MG tablet   Oral   Take 81 mg by mouth daily.         . furosemide (LASIX) 40 MG tablet   Oral   Take 40 mg by mouth daily.         . metFORMIN (GLUCOPHAGE) 500 MG tablet   Oral   Take 500 mg by mouth 2 (two) times daily with a meal.         . omeprazole (PRILOSEC) 20 MG capsule   Oral   Take 20 mg by mouth daily.         . ramipril (ALTACE) 10 MG capsule   Oral   Take 20 mg by mouth daily.  BP 133/73  Pulse 101  Temp(Src) 97.9 F (36.6 C) (Oral)  Resp 18  SpO2 100%  LMP 10/04/2012  Physical Exam  Nursing note and vitals reviewed. Constitutional: She is oriented to person, place, and time. She appears well-developed and well-nourished. No distress.  HENT:  Head: Normocephalic.  Cardiovascular: Normal rate and regular rhythm.   Pulmonary/Chest: Effort normal and breath sounds normal. No respiratory distress. She has no wheezes.  Abdominal: Soft. There is tenderness in the suprapubic area. There is CVA tenderness. There is no rebound, no tenderness at McBurney's point and negative Murphy's sign.  Musculoskeletal: Normal range of motion.  Neurological: She is alert and oriented to person, place, and time.  Skin: Skin is warm and dry. No rash noted.  Psychiatric: She has a normal mood and affect. Her behavior is normal.    ED Course  Procedures   Results for orders  placed during the hospital encounter of 12/02/12  URINALYSIS, ROUTINE W REFLEX MICROSCOPIC      Result Value Range   Color, Urine YELLOW  YELLOW   APPearance CLEAR  CLEAR   Specific Gravity, Urine 1.004 (*) 1.005 - 1.030   pH 7.0  5.0 - 8.0   Glucose, UA NEGATIVE  NEGATIVE mg/dL   Hgb urine dipstick TRACE (*) NEGATIVE   Bilirubin Urine NEGATIVE  NEGATIVE   Ketones, ur NEGATIVE  NEGATIVE mg/dL   Protein, ur 30 (*) NEGATIVE mg/dL   Urobilinogen, UA 0.2  0.0 - 1.0 mg/dL   Nitrite NEGATIVE  NEGATIVE   Leukocytes, UA LARGE (*) NEGATIVE  CBC WITH DIFFERENTIAL      Result Value Range   WBC 14.5 (*) 4.0 - 10.5 K/uL   RBC 4.41  3.87 - 5.11 MIL/uL   Hemoglobin 12.0  12.0 - 15.0 g/dL   HCT 16.1  09.6 - 04.5 %   MCV 82.8  78.0 - 100.0 fL   MCH 27.2  26.0 - 34.0 pg   MCHC 32.9  30.0 - 36.0 g/dL   RDW 40.9  81.1 - 91.4 %   Platelets 305  150 - 400 K/uL   Neutrophils Relative 75  43 - 77 %   Neutro Abs 10.8 (*) 1.7 - 7.7 K/uL   Lymphocytes Relative 17  12 - 46 %   Lymphs Abs 2.5  0.7 - 4.0 K/uL   Monocytes Relative 6  3 - 12 %   Monocytes Absolute 0.9  0.1 - 1.0 K/uL   Eosinophils Relative 2  0 - 5 %   Eosinophils Absolute 0.2  0.0 - 0.7 K/uL   Basophils Relative 0  0 - 1 %   Basophils Absolute 0.1  0.0 - 0.1 K/uL  COMPREHENSIVE METABOLIC PANEL      Result Value Range   Sodium 135  135 - 145 mEq/L   Potassium 3.5  3.5 - 5.1 mEq/L   Chloride 96  96 - 112 mEq/L   CO2 28  19 - 32 mEq/L   Glucose, Bld 130 (*) 70 - 99 mg/dL   BUN 7  6 - 23 mg/dL   Creatinine, Ser 7.82  0.50 - 1.10 mg/dL   Calcium 9.3  8.4 - 95.6 mg/dL   Total Protein 7.5  6.0 - 8.3 g/dL   Albumin 3.9  3.5 - 5.2 g/dL   AST 13  0 - 37 U/L   ALT 16  0 - 35 U/L   Alkaline Phosphatase 61  39 - 117 U/L   Total Bilirubin 0.3  0.3 - 1.2 mg/dL   GFR calc non Af Amer >90  >90 mL/min   GFR calc Af Amer >90  >90 mL/min  URINE MICROSCOPIC-ADD ON      Result Value Range   Squamous Epithelial / LPF FEW (*) RARE   WBC, UA 7-10   <3 WBC/hpf   RBC / HPF 0-2  <3 RBC/hpf       1. Pyelonephritis       MDM  Patient seen and evaluated. Patient does not appear in any acute distress or significant discomforts at this time. She does report improvement after receiving fentanyl earlier.  No significant RBCs on UA. Discussed with patient options for imaging to evaluate for potential kidney stone. At this time we'll plan to treat for infection with close followup.    Angus Seller, [place] 12/03/12 [phone]

## 2013-01-22 ENCOUNTER — Other Ambulatory Visit (INDEPENDENT_AMBULATORY_CARE_PROVIDER_SITE_OTHER): Payer: 59

## 2013-01-22 ENCOUNTER — Other Ambulatory Visit: Payer: Self-pay | Admitting: *Deleted

## 2013-01-22 DIAGNOSIS — N390 Urinary tract infection, site not specified: Secondary | ICD-10-CM

## 2013-01-22 LAB — POCT URINALYSIS DIPSTICK
Bilirubin, UA: NEGATIVE
Glucose, UA: NEGATIVE
Ketones, UA: NEGATIVE
Leukocytes, UA: NEGATIVE
Protein, UA: NEGATIVE
Spec Grav, UA: 1.015

## 2013-01-22 LAB — POCT UA - MICROSCOPIC ONLY

## 2013-01-22 NOTE — Addendum Note (Signed)
Addended by: Monica Becton on: 01/22/2013 06:26 PM   Modules accepted: Orders

## 2013-01-23 NOTE — Progress Notes (Signed)
Pt notified of results

## 2013-02-07 ENCOUNTER — Telehealth: Payer: Self-pay | Admitting: *Deleted

## 2013-02-07 MED ORDER — FUROSEMIDE 40 MG PO TABS: 40.0000 mg | ORAL_TABLET | Freq: Every day | ORAL | Status: DC

## 2013-02-07 MED ORDER — RAMIPRIL 10 MG PO CAPS: 20.0000 mg | ORAL_CAPSULE | Freq: Every day | ORAL | Status: DC

## 2013-02-13 NOTE — Telephone Encounter (Signed)
done

## 2013-05-02 ENCOUNTER — Other Ambulatory Visit: Payer: Self-pay | Admitting: Family Medicine

## 2013-05-03 ENCOUNTER — Other Ambulatory Visit: Payer: Self-pay | Admitting: Nurse Practitioner

## 2013-05-03 MED ORDER — ALPRAZOLAM 0.5 MG PO TABS: 0.5000 mg | ORAL_TABLET | Freq: Every evening | ORAL | Status: DC | PRN

## 2013-05-03 MED ORDER — RAMIPRIL 10 MG PO CAPS: 20.0000 mg | ORAL_CAPSULE | Freq: Every day | ORAL | Status: DC

## 2013-05-03 MED ORDER — FUROSEMIDE 40 MG PO TABS: 40.0000 mg | ORAL_TABLET | Freq: Every day | ORAL | Status: DC

## 2013-07-03 ENCOUNTER — Other Ambulatory Visit: Payer: Self-pay | Admitting: Nurse Practitioner

## 2013-07-03 ENCOUNTER — Other Ambulatory Visit (INDEPENDENT_AMBULATORY_CARE_PROVIDER_SITE_OTHER): Payer: 59

## 2013-07-03 DIAGNOSIS — N390 Urinary tract infection, site not specified: Secondary | ICD-10-CM

## 2013-07-03 DIAGNOSIS — R300 Dysuria: Secondary | ICD-10-CM

## 2013-07-03 LAB — POCT URINALYSIS DIPSTICK
Bilirubin, UA: NEGATIVE
Glucose, UA: NEGATIVE
Ketones, UA: NEGATIVE
Nitrite, UA: NEGATIVE

## 2013-07-03 LAB — POCT UA - MICROSCOPIC ONLY
Bacteria, U Microscopic: NEGATIVE
Yeast, UA: NEGATIVE

## 2013-07-03 MED ORDER — LEVOFLOXACIN 500 MG PO TABS: 500.0000 mg | ORAL_TABLET | Freq: Every day | ORAL | Status: DC

## 2013-07-03 NOTE — Progress Notes (Signed)
Pt came in for labs only 

## 2013-07-30 ENCOUNTER — Other Ambulatory Visit: Payer: Self-pay | Admitting: Nurse Practitioner

## 2013-07-31 NOTE — Telephone Encounter (Signed)
i dont see an A1C in epic

## 2013-07-31 NOTE — Telephone Encounter (Signed)
NTBS for HgbA1c

## 2013-08-24 ENCOUNTER — Other Ambulatory Visit: Payer: Self-pay | Admitting: Nurse Practitioner

## 2013-08-24 ENCOUNTER — Other Ambulatory Visit (INDEPENDENT_AMBULATORY_CARE_PROVIDER_SITE_OTHER): Payer: 59

## 2013-08-24 DIAGNOSIS — E119 Type 2 diabetes mellitus without complications: Secondary | ICD-10-CM

## 2013-08-24 DIAGNOSIS — Z0000 Encounter for general adult medical examination without abnormal findings: Secondary | ICD-10-CM

## 2013-08-24 LAB — POCT URINALYSIS DIPSTICK
Bilirubin, UA: NEGATIVE
Ketones, UA: NEGATIVE
Leukocytes, UA: NEGATIVE
Spec Grav, UA: 1.01
pH, UA: 7

## 2013-08-24 LAB — POCT CBC
MCHC: 32.9 g/dL (ref 31.8–35.4)
MPV: 9.8 fL (ref 0–99.8)
POC Granulocyte: 6.3 (ref 2–6.9)
POC LYMPH PERCENT: 24.7 %L (ref 10–50)
RDW, POC: 14.2 %
WBC: 8.9 10*3/uL (ref 4.6–10.2)

## 2013-08-24 LAB — POCT UA - MICROSCOPIC ONLY: WBC, Ur, HPF, POC: NEGATIVE

## 2013-08-24 NOTE — Progress Notes (Unsigned)
Patient came in for labs only.

## 2013-08-28 ENCOUNTER — Ambulatory Visit (INDEPENDENT_AMBULATORY_CARE_PROVIDER_SITE_OTHER): Payer: 59 | Admitting: Nurse Practitioner

## 2013-08-28 ENCOUNTER — Encounter: Payer: Self-pay | Admitting: Nurse Practitioner

## 2013-08-28 VITALS — BP 138/77 | HR 84 | Temp 97.6°F | Ht 67.0 in | Wt 242.0 lb

## 2013-08-28 DIAGNOSIS — Z0000 Encounter for general adult medical examination without abnormal findings: Secondary | ICD-10-CM

## 2013-08-28 DIAGNOSIS — Z01419 Encounter for gynecological examination (general) (routine) without abnormal findings: Secondary | ICD-10-CM

## 2013-08-28 LAB — THYROID PANEL WITH TSH
Free Thyroxine Index: 2.2 (ref 1.2–4.9)
T3 Uptake Ratio: 27 % (ref 24–39)

## 2013-08-28 LAB — CMP14+EGFR
Albumin: 4.4 g/dL (ref 3.5–5.5)
Alkaline Phosphatase: 52 IU/L (ref 39–117)
BUN/Creatinine Ratio: 13 (ref 9–23)
BUN: 9 mg/dL (ref 6–24)
CO2: 26 mmol/L (ref 18–29)
Chloride: 98 mmol/L (ref 97–108)
Glucose: 127 mg/dL — ABNORMAL HIGH (ref 65–99)
Total Protein: 6.6 g/dL (ref 6.0–8.5)

## 2013-08-28 LAB — NMR, LIPOPROFILE
Cholesterol: 181 mg/dL (ref <200)
LDL Particle Number: 1722 nmol/L — ABNORMAL HIGH (ref <1000)
LDLC SERPL CALC-MCNC: 102 mg/dL — ABNORMAL HIGH (ref <100)
LP-IR Score: 81 — ABNORMAL HIGH (ref <=45)

## 2013-08-28 NOTE — Patient Instructions (Signed)

## 2013-08-28 NOTE — Progress Notes (Signed)
Subjective:    Patient ID: [name], female    DOB: January 18, 1961, 52 y.o.   MRN: [id number]  HPI  Patient here today for complete physical exam and PAP- she is doing quite well- noi complaints today. Patient Active Problem List   Diagnosis Date Noted  . DYSLIPIDEMIA 10/23/2007  . OBESITY 10/23/2007  . HYPERTENSION 10/23/2007  . CONSTIPATION 10/23/2007  . CHEST DISCOMFORT 10/23/2007  . RUQ PAIN 10/23/2007  . ESOPHAGITIS, REFLUX 10/07/2006  . HIATAL HERNIA 10/07/2006  . POLYP, COLON 09/14/2006   Outpatient Encounter Prescriptions as of 08/28/2013  Medication Sig  . ALPRAZolam (XANAX) 0.5 MG tablet Take 1 tablet (0.5 mg total) by mouth at bedtime as needed for sleep.  Marland Kitchen aspirin EC 81 MG tablet Take 81 mg by mouth daily.  . furosemide (LASIX) 40 MG tablet Take 1 tablet (40 mg total) by mouth daily.  . metFORMIN (GLUCOPHAGE) 500 MG tablet TAKE 1 TABLET BY MOUTH TWICE DAILY  . omeprazole (PRILOSEC) 20 MG capsule Take 20 mg by mouth daily.  . ramipril (ALTACE) 10 MG capsule Take 2 capsules (20 mg total) by mouth daily.  . [DISCONTINUED] cefixime (SUPRAX) 400 MG tablet Take 1 tablet (400 mg total) by mouth daily.  . [DISCONTINUED] HYDROcodone-acetaminophen (NORCO) 5-325 MG per tablet Take 1-2 tablets by mouth every 4 (four) hours as needed for pain.  . [DISCONTINUED] levofloxacin (LEVAQUIN) 500 MG tablet Take 1 tablet (500 mg total) by mouth daily.       Review of Systems  Constitutional: Negative.   HENT: Negative.   Eyes: Negative.   Respiratory: Negative.   Cardiovascular: Negative.   Gastrointestinal: Negative.   Endocrine: Negative.   Genitourinary: Negative.   Musculoskeletal: Negative.   Allergic/Immunologic: Negative.   Neurological: Negative.   Hematological: Negative.   Psychiatric/Behavioral: Negative.   All other systems reviewed and are negative.       Objective:   Physical Exam  Constitutional: She is oriented to person, place, and time. She appears  well-developed and well-nourished.  HENT:  Head: Normocephalic.  Right Ear: Hearing, tympanic membrane, external ear and ear canal normal.  Left Ear: Hearing, tympanic membrane, external ear and ear canal normal.  Nose: Nose normal.  Mouth/Throat: Uvula is midline and oropharynx is clear and moist.  Eyes: Conjunctivae and EOM are normal. Pupils are equal, round, and reactive to light.  Neck: Normal range of motion and full passive range of motion without pain. Neck supple. No JVD present. Carotid bruit is not present. No mass and no thyromegaly present.  Cardiovascular: Normal rate, normal heart sounds and intact distal pulses.   No murmur heard. Pulmonary/Chest: Effort normal and breath sounds normal. Right breast exhibits no inverted nipple, no mass, no nipple discharge, no skin change and no tenderness. Left breast exhibits no inverted nipple, no mass, no nipple discharge, no skin change and no tenderness.  Abdominal: Soft. Bowel sounds are normal. She exhibits no mass. There is no tenderness.  Genitourinary: Vagina normal and uterus normal. No breast swelling, tenderness, discharge or bleeding.  bimanual exam-No adnexal masses or tenderness. Cervical os is parous and pink- no discharge  Musculoskeletal: Normal range of motion.  Lymphadenopathy:    She has no cervical adenopathy.  Neurological: She is alert and oriented to person, place, and time.  Skin: Skin is warm and dry.  Psychiatric: She has a normal mood and affect. Her behavior is normal. Judgment and thought content normal.    BP 138/77  Pulse 84  Temp(Src) 97.6  F (36.4 C) (Oral)  Ht 5\' 7"  (1.702 m)  Wt 242 lb (109.77 kg)  BMI 37.89 kg/m2  LMP 08/07/2013       Assessment & Plan:   1. Annual physical exam   2. Encounter for routine gynecological examination     PAP ordered Continue all meds Labs pending Diet and exercise encouraged Health maintenance reviewed Follow up in 3 months  [name]-Margaret Daphine Deutscher,  FNP

## 2013-09-06 LAB — PAP IG W/ RFLX HPV ASCU

## 2013-09-07 ENCOUNTER — Ambulatory Visit (INDEPENDENT_AMBULATORY_CARE_PROVIDER_SITE_OTHER): Payer: 59 | Admitting: Physician Assistant

## 2013-09-07 ENCOUNTER — Encounter: Payer: Self-pay | Admitting: Physician Assistant

## 2013-09-07 ENCOUNTER — Encounter (INDEPENDENT_AMBULATORY_CARE_PROVIDER_SITE_OTHER): Payer: Self-pay

## 2013-09-07 VITALS — BP 119/69 | HR 88 | Temp 97.4°F | Wt 240.2 lb

## 2013-09-07 DIAGNOSIS — L738 Other specified follicular disorders: Secondary | ICD-10-CM

## 2013-09-07 DIAGNOSIS — L821 Other seborrheic keratosis: Secondary | ICD-10-CM

## 2013-09-07 NOTE — Progress Notes (Signed)
   Subjective:    Patient ID: [name], female    DOB: [birth year], 52 y.o.   MRN: [id number]  HPI 52 y/o female presents for lesion on back that occasionally itches. No change in size or appearance that she's noticed.    Review of Systems  Constitutional: Negative.   Skin: Negative.        Objective:   Physical Exam  Constitutional: She is oriented to person, place, and time. She appears well-developed and well-nourished. No distress.  Neurological: She is alert and oriented to person, place, and time.  Skin: Skin is warm. No rash noted. She is not diaphoretic. There is erythema. No pallor.  Patient has numerous, 100+ nevi that are benign appearing at this time. Lesion of concern is flesh colored, approximately 4mm raised papule that appears to be a seborrheic keratosis. Patient also has raised, umbilicated lesions of sebaceous hyperplasia and milia on face.           Assessment & Plan:  1. Seborrheic Keratosis: To the naked eye, patient has what appears to be a seborrheic keratosis on posterior trunk. I will reexamine under dermatoscope at a future visit to confirm. Patient deferred treatment with cryo at this time.  2. Sebaceous hyperplasia and milia of face:No treatment required at this time. Benign 3. Multiple nevi to monitor: Patient has multiple benign appearing nevi, 100+, on waist up exam. However, there were 2 atypical appearing nevi of concern. These will be reexaminied under dermatoscope in a future visit.   Patient will reschedule for 1 week.

## 2013-09-21 ENCOUNTER — Ambulatory Visit (INDEPENDENT_AMBULATORY_CARE_PROVIDER_SITE_OTHER): Payer: 59 | Admitting: Physician Assistant

## 2013-09-21 ENCOUNTER — Encounter: Payer: Self-pay | Admitting: Physician Assistant

## 2013-09-21 VITALS — BP 119/68 | HR 81 | Temp 97.4°F | Wt 238.4 lb

## 2013-09-21 DIAGNOSIS — D239 Other benign neoplasm of skin, unspecified: Secondary | ICD-10-CM

## 2013-09-21 DIAGNOSIS — D229 Melanocytic nevi, unspecified: Secondary | ICD-10-CM

## 2013-09-21 NOTE — Progress Notes (Signed)
   Subjective:    Patient ID: [name], female    DOB: [birth year], 52 y.o.   MRN: [id number]  HPI 52 y/o female presents for recheck of pigmented lesion on back    Review of Systems  Skin:       Several moles on back and BUE  All other systems reviewed and are negative.       Objective:   Physical Exam  Skin:  Atypical appearing nevi on center back and new nevi on dorsal surface of L hand          Assessment & Plan:  1. Atypical nevi: Advised patient to seek dermatological care for biopsy of atypical nevi on back and new atypical appearing nevi on dorsal surface of L hand.

## 2013-10-05 ENCOUNTER — Other Ambulatory Visit: Payer: Self-pay | Admitting: General Practice

## 2013-10-05 DIAGNOSIS — Z20828 Contact with and (suspected) exposure to other viral communicable diseases: Secondary | ICD-10-CM

## 2013-10-05 MED ORDER — OSELTAMIVIR PHOSPHATE 75 MG PO CAPS: 75.0000 mg | ORAL_CAPSULE | Freq: Every day | ORAL | Status: DC

## 2013-10-08 DIAGNOSIS — J029 Acute pharyngitis, unspecified: Secondary | ICD-10-CM

## 2013-10-08 DIAGNOSIS — R05 Cough: Secondary | ICD-10-CM

## 2013-10-08 DIAGNOSIS — R059 Cough, unspecified: Secondary | ICD-10-CM

## 2013-11-02 ENCOUNTER — Other Ambulatory Visit: Payer: Self-pay | Admitting: Nurse Practitioner

## 2013-11-10 ENCOUNTER — Encounter: Payer: Self-pay | Admitting: Dietician

## 2013-11-10 ENCOUNTER — Encounter: Payer: 59 | Attending: Family Medicine | Admitting: Dietician

## 2013-11-10 VITALS — Wt 243.1 lb

## 2013-11-10 DIAGNOSIS — E119 Type 2 diabetes mellitus without complications: Secondary | ICD-10-CM | POA: Insufficient documentation

## 2013-11-10 DIAGNOSIS — E669 Obesity, unspecified: Secondary | ICD-10-CM

## 2013-11-10 NOTE — Patient Instructions (Signed)
Patient to set specific, measurable, attainable, realistic, and time-oriented goals.  

## 2013-11-10 NOTE — Progress Notes (Signed)
Weight Management Class:  Appt start time: 1000   End time:  1130.  Patient was seen on 11/10/2013 for Weight Management Education at [facility].   Weight today: 243.1 lbs   The following the learning objectives were met by the patient during this course:  Identify healthy eating/lifestyle behaviors for weight loss  Be familiar with the different food groups and appropriate serving sizes  Learn healthy meal planning  State the appropriate amount of weight to lose weekly  Identify 1-2 goals to begin to lose weight   Follow-Up Plan: Patient to set specific, measurable, attainable, realistic, and time-oriented goals for weight loss.

## 2013-12-27 ENCOUNTER — Other Ambulatory Visit (INDEPENDENT_AMBULATORY_CARE_PROVIDER_SITE_OTHER): Payer: 59

## 2013-12-27 ENCOUNTER — Other Ambulatory Visit: Payer: Self-pay | Admitting: Nurse Practitioner

## 2013-12-27 DIAGNOSIS — N898 Other specified noninflammatory disorders of vagina: Secondary | ICD-10-CM

## 2013-12-27 DIAGNOSIS — R300 Dysuria: Secondary | ICD-10-CM

## 2013-12-27 LAB — POCT URINALYSIS DIPSTICK
Bilirubin, UA: NEGATIVE
GLUCOSE UA: NEGATIVE
KETONES UA: NEGATIVE
Leukocytes, UA: NEGATIVE
Nitrite, UA: NEGATIVE
Protein, UA: NEGATIVE
RBC UA: NEGATIVE
SPEC GRAV UA: 1.02
Urobilinogen, UA: NEGATIVE
pH, UA: 6

## 2013-12-27 LAB — POCT UA - MICROSCOPIC ONLY
Casts, Ur, LPF, POC: NEGATIVE
Crystals, Ur, HPF, POC: NEGATIVE
MUCUS UA: NEGATIVE
RBC, URINE, MICROSCOPIC: NEGATIVE
Yeast, UA: NEGATIVE

## 2013-12-27 LAB — POCT WET PREP (WET MOUNT)

## 2013-12-27 MED ORDER — FLUCONAZOLE 150 MG PO TABS: 150.0000 mg | ORAL_TABLET | Freq: Once | ORAL | Status: DC

## 2014-01-07 ENCOUNTER — Other Ambulatory Visit (INDEPENDENT_AMBULATORY_CARE_PROVIDER_SITE_OTHER): Payer: 59 | Admitting: *Deleted

## 2014-01-07 DIAGNOSIS — R05 Cough: Secondary | ICD-10-CM

## 2014-01-07 DIAGNOSIS — R059 Cough, unspecified: Secondary | ICD-10-CM

## 2014-01-07 MED ORDER — METHYLPREDNISOLONE ACETATE 80 MG/ML IJ SUSP
80.0000 mg | Freq: Once | INTRAMUSCULAR | Status: AC
  Administered 2014-01-07: 80 mg via INTRAMUSCULAR

## 2014-01-10 ENCOUNTER — Encounter: Payer: Self-pay | Admitting: Nurse Practitioner

## 2014-01-10 ENCOUNTER — Ambulatory Visit (INDEPENDENT_AMBULATORY_CARE_PROVIDER_SITE_OTHER): Payer: 59 | Admitting: Nurse Practitioner

## 2014-01-10 VITALS — BP 128/65 | HR 80 | Temp 97.7°F | Ht 67.0 in | Wt 235.6 lb

## 2014-01-10 DIAGNOSIS — J208 Acute bronchitis due to other specified organisms: Secondary | ICD-10-CM

## 2014-01-10 DIAGNOSIS — J209 Acute bronchitis, unspecified: Secondary | ICD-10-CM

## 2014-01-10 MED ORDER — BENZONATATE 200 MG PO CAPS: 200.0000 mg | ORAL_CAPSULE | Freq: Three times a day (TID) | ORAL | Status: DC | PRN

## 2014-01-10 NOTE — Progress Notes (Signed)
   Subjective:    Patient ID: [name], female    DOB: 1961/05/10, 53 y.o.   MRN: [id number]  HPI Patient presents today complaining of cough and congestion that began about 5 days ago. Symptoms include sore throat, rhinnorhea, intermittent headache, productive cough, chills, SOB with exertion, and wheezing. Treatment tried includes 80 mg of Depomedrol, Tylenol, and anti-tussive. Denies fever.   Review of Systems  Constitutional: Positive for chills and fatigue. Negative for fever.  HENT: Positive for congestion and rhinorrhea. Negative for sinus pressure.   Respiratory: Positive for cough. Negative for shortness of breath.   Cardiovascular: Negative for chest pain.  Neurological: Positive for headaches.  All other systems reviewed and are negative.      Objective:   Physical Exam  Constitutional: She is oriented to person, place, and time. She appears well-developed and well-nourished.  HENT:  Head: Normocephalic.  Right Ear: External ear normal.  Left Ear: External ear normal.  Mouth/Throat: Oropharynx is clear and moist.  Neck: Normal range of motion. Neck supple.  Cardiovascular: Normal rate, regular rhythm and normal heart sounds.   Pulmonary/Chest: Effort normal and breath sounds normal. She has no wheezes.  Lymphadenopathy:    She has no cervical adenopathy.  Neurological: She is alert and oriented to person, place, and time.  Skin: Skin is warm and dry.  Psychiatric: She has a normal mood and affect. Her behavior is normal. Judgment and thought content normal.   BP 128/65  Pulse 80  Temp(Src) 97.7 F (36.5 C) (Oral)  Ht 5\' 7"  (1.702 m)  Wt 235 lb 9.6 oz (106.867 kg)  BMI 36.89 kg/m2        Assessment & Plan:   1. Viral bronchitis    Meds ordered this encounter  Medications  . benzonatate (TESSALON) 200 MG capsule    Sig: Take 1 capsule (200 mg total) by mouth 3 (three) times daily as needed for cough.    Dispense:  20 capsule    Refill:  0    Order  Specific Question:  Supervising Provider    Answer:  Chipper Herb [[id number]]   1. Take meds as prescribed 2. Use a cool mist humidifier especially during the winter months and when heat has been humid. 3. Use saline nose sprays frequently 4. Saline irrigations of the nose can be very helpful if done frequently.  * 4X daily for 1 week*  * Use of a nettie pot can be helpful with this. Follow directions with this* 5. Drink plenty of fluids 6. Keep thermostat turn down low 7.For any cough or congestion  Use plain Mucinex- regular strength or max strength is fine   * Children- consult with Pharmacist for dosing 8. For fever or aces or pains- take tylenol or ibuprofen appropriate for age and weight.  * for fevers greater than 101 orally you may alternate ibuprofen and tylenol every  3 hours.   [name]-Margaret Hassell Done, FNP

## 2014-01-10 NOTE — Patient Instructions (Signed)

## 2014-01-11 ENCOUNTER — Other Ambulatory Visit: Payer: Self-pay | Admitting: Nurse Practitioner

## 2014-01-11 MED ORDER — LEVOFLOXACIN 500 MG PO TABS: 500.0000 mg | ORAL_TABLET | Freq: Every day | ORAL | Status: DC

## 2014-04-16 ENCOUNTER — Other Ambulatory Visit: Payer: Self-pay | Admitting: Nurse Practitioner

## 2014-04-16 NOTE — Telephone Encounter (Signed)
Patient NTBS for follow up and lab work  

## 2014-04-16 NOTE — Telephone Encounter (Signed)
Last ov 4/15. Last AIC 5.9 on 11/14.

## 2014-04-17 ENCOUNTER — Other Ambulatory Visit: Payer: Self-pay

## 2014-04-18 LAB — CYTOLOGY - PAP

## 2014-04-18 NOTE — Telephone Encounter (Signed)
Pt aware needs appt. 

## 2014-04-24 ENCOUNTER — Other Ambulatory Visit: Payer: Self-pay | Admitting: *Deleted

## 2014-04-24 ENCOUNTER — Other Ambulatory Visit: Payer: Self-pay | Admitting: Nurse Practitioner

## 2014-04-24 ENCOUNTER — Other Ambulatory Visit (INDEPENDENT_AMBULATORY_CARE_PROVIDER_SITE_OTHER): Payer: 59

## 2014-04-24 DIAGNOSIS — R300 Dysuria: Secondary | ICD-10-CM

## 2014-04-24 DIAGNOSIS — N390 Urinary tract infection, site not specified: Secondary | ICD-10-CM

## 2014-04-24 LAB — POCT URINALYSIS DIPSTICK
BILIRUBIN UA: NEGATIVE
GLUCOSE UA: NEGATIVE
Ketones, UA: NEGATIVE
NITRITE UA: NEGATIVE
Protein, UA: NEGATIVE
Spec Grav, UA: <=1.005
UROBILINOGEN UA: NEGATIVE
pH, UA: 6.5

## 2014-04-24 LAB — POCT UA - MICROSCOPIC ONLY
Casts, Ur, LPF, POC: NEGATIVE
Crystals, Ur, HPF, POC: NEGATIVE
Mucus, UA: NEGATIVE
Yeast, UA: NEGATIVE

## 2014-04-24 MED ORDER — LEVOFLOXACIN 500 MG PO TABS: 500.0000 mg | ORAL_TABLET | Freq: Every day | ORAL | Status: DC

## 2014-04-29 ENCOUNTER — Ambulatory Visit (INDEPENDENT_AMBULATORY_CARE_PROVIDER_SITE_OTHER): Payer: 59 | Admitting: Nurse Practitioner

## 2014-04-29 ENCOUNTER — Encounter: Payer: Self-pay | Admitting: Nurse Practitioner

## 2014-04-29 VITALS — BP 142/79 | HR 72 | Temp 97.1°F | Ht 67.0 in | Wt 244.6 lb

## 2014-04-29 DIAGNOSIS — K2100 Gastro-esophageal reflux disease with esophagitis, without bleeding: Secondary | ICD-10-CM

## 2014-04-29 DIAGNOSIS — E119 Type 2 diabetes mellitus without complications: Secondary | ICD-10-CM | POA: Insufficient documentation

## 2014-04-29 DIAGNOSIS — M545 Low back pain, unspecified: Secondary | ICD-10-CM

## 2014-04-29 DIAGNOSIS — E785 Hyperlipidemia, unspecified: Secondary | ICD-10-CM

## 2014-04-29 DIAGNOSIS — I10 Essential (primary) hypertension: Secondary | ICD-10-CM

## 2014-04-29 DIAGNOSIS — K5900 Constipation, unspecified: Secondary | ICD-10-CM

## 2014-04-29 DIAGNOSIS — N390 Urinary tract infection, site not specified: Secondary | ICD-10-CM

## 2014-04-29 DIAGNOSIS — K449 Diaphragmatic hernia without obstruction or gangrene: Secondary | ICD-10-CM

## 2014-04-29 DIAGNOSIS — E669 Obesity, unspecified: Secondary | ICD-10-CM

## 2014-04-29 LAB — POCT URINALYSIS DIPSTICK
Bilirubin, UA: NEGATIVE
GLUCOSE UA: NEGATIVE
KETONES UA: NEGATIVE
Leukocytes, UA: NEGATIVE
Nitrite, UA: NEGATIVE
PROTEIN UA: NEGATIVE
Spec Grav, UA: <=1.005
Urobilinogen, UA: NEGATIVE
pH, UA: 5

## 2014-04-29 LAB — POCT UA - MICROSCOPIC ONLY
Casts, Ur, LPF, POC: NEGATIVE
Crystals, Ur, HPF, POC: NEGATIVE
Mucus, UA: NEGATIVE
WBC, Ur, HPF, POC: NEGATIVE
Yeast, UA: NEGATIVE

## 2014-04-29 LAB — POCT GLYCOSYLATED HEMOGLOBIN (HGB A1C): Hemoglobin A1C: 6

## 2014-04-29 LAB — POCT UA - MICROALBUMIN: Microalbumin Ur, POC: NEGATIVE mg/L

## 2014-04-29 MED ORDER — CEFTRIAXONE SODIUM 1 G IJ SOLR
500.0000 mg | INTRAMUSCULAR | Status: AC
  Administered 2014-04-29: 500 mg via INTRAMUSCULAR

## 2014-04-29 NOTE — Patient Instructions (Signed)

## 2014-04-29 NOTE — Progress Notes (Signed)
Subjective:    Patient ID: [name], female    DOB: 04/27/1961, 53 y.o.   MRN: [id number]  Patient here today for follow up of chronic medical problems. Was diagnosed with a UTI last week- was given levaquin. Still feels like she has symptoms.   Back Pain This is a new problem. The current episode started in the past 7 days. The problem occurs intermittently. The problem is unchanged. The pain is present in the lumbar spine. The quality of the pain is described as aching and stabbing. The pain does not radiate. The pain is at a severity of 4/10. The pain is mild. The pain is worse during the day. Exacerbated by: needing to void. Stiffness is present all day. Pertinent negatives include no headaches or weakness. Risk factors include obesity. She has tried nothing for the symptoms. The treatment provided no relief.  Hyperlipidemia This is a chronic problem. The current episode started more than 1 year ago. The problem is uncontrolled. Recent lipid tests were reviewed and are high. Exacerbating diseases include diabetes and obesity. She has no history of hypothyroidism. Pertinent negatives include no focal sensory loss or shortness of breath. She is currently on no antihyperlipidemic treatment. The current treatment provides mild improvement of lipids. Compliance problems include adherence to diet and adherence to exercise.  Risk factors for coronary artery disease include dyslipidemia, family history, diabetes mellitus, hypertension and obesity.  Hypertension This is a chronic problem. The current episode started more than 1 year ago. The problem is unchanged. The problem is controlled. Pertinent negatives include no anxiety, headaches, palpitations, peripheral edema or shortness of breath. There are no associated agents to hypertension. Risk factors for coronary artery disease include dyslipidemia, diabetes mellitus, family history, obesity and post-menopausal state. Past treatments include diuretics  and ACE inhibitors. The current treatment provides significant improvement. Compliance problems include diet and exercise.   Diabetes She presents for her follow-up diabetic visit. She has type 2 diabetes mellitus. The initial diagnosis of diabetes was made 1 year ago. Her disease course has been stable. Pertinent negatives for hypoglycemia include no headaches. Pertinent negatives for diabetes include no fatigue, no polydipsia, no polyphagia, no polyuria and no weakness. There are no hypoglycemic complications. Symptoms are stable. There are no diabetic complications. Risk factors for coronary artery disease include diabetes mellitus, dyslipidemia, family history, hypertension and obesity. Current diabetic treatment includes oral agent (monotherapy) and diet. She is compliant with treatment most of the time. Her weight is stable. She is following a generally healthy diet. When asked about meal planning, she reported none. She has not had a previous visit with a dietician. She participates in exercise weekly. Her breakfast blood glucose is taken between 7-8 am. Her breakfast blood glucose range is generally 130-140 mg/dl. Her highest blood glucose is 180-200 mg/dl. Her overall blood glucose range is 130-140 mg/dl. An ACE inhibitor/angiotensin II receptor blocker is being taken. She does not see a podiatrist.Eye exam is current.  GERD/ Hiatal hernia Omeprazole working to keep symptoms under control GAD Xanax 0.5 on prn basis- hasn't taken in over a year.  Review of Systems  Constitutional: Negative for fatigue.  Respiratory: Negative for shortness of breath.   Cardiovascular: Negative for palpitations.  Endocrine: Negative for polydipsia, polyphagia and polyuria.  Musculoskeletal: Positive for back pain.  Neurological: Negative for weakness and headaches.  All other systems reviewed and are negative.      Objective:   Physical Exam  Constitutional: She is oriented to person, place,  and time. She  appears well-developed and well-nourished.  HENT:  Nose: Nose normal.  Mouth/Throat: Oropharynx is clear and moist.  Eyes: EOM are normal.  Neck: Trachea normal, normal range of motion and full passive range of motion without pain. Neck supple. No JVD present. Carotid bruit is not present. No thyromegaly present.  Cardiovascular: Normal rate, regular rhythm, normal heart sounds and intact distal pulses.  Exam reveals no gallop and no friction rub.   No murmur heard. Pulmonary/Chest: Effort normal and breath sounds normal.  Abdominal: Soft. Bowel sounds are normal. She exhibits no distension and no mass. There is no tenderness.  Genitourinary:  Left CVA tenderness  Musculoskeletal: Normal range of motion.  Lymphadenopathy:    She has no cervical adenopathy.  Neurological: She is alert and oriented to person, place, and time. She has normal reflexes.  Skin: Skin is warm and dry.  Psychiatric: She has a normal mood and affect. Her behavior is normal. Judgment and thought content normal.   BP 142/79  Pulse 72  Temp(Src) 97.1 F (36.2 C) (Oral)  Ht _0  (1.702 m)  Wt 244 lb 9.6 oz (110.95 kg)  BMI 38.30 kg/m2  Results for orders placed in visit on 04/24/14  POCT URINALYSIS DIPSTICK      Result Value Ref Range   Color, UA yellow     Clarity, UA clear     Glucose, UA neg     Bilirubin, UA neg     Ketones, UA neg     Spec Grav, UA <=1.005     Blood, UA large     pH, UA 6.5     Protein, UA neg     Urobilinogen, UA negative     Nitrite, UA neg     Leukocytes, UA large (3+)    POCT UA - MICROSCOPIC ONLY      Result Value Ref Range   WBC, Ur, HPF, POC 10-20     RBC, urine, microscopic 10-20     Bacteria, U Microscopic few     Mucus, UA negative     Epithelial cells, urine per micros occ     Crystals, Ur, HPF, POC negative     Casts, Ur, LPF, POC negative     Yeast, UA negative            Assessment & Plan:   1. Low back pain without sciatica, unspecified back pain  laterality   2. HYPERTENSION   3. Hyperlipidemia with target LDL less than 100   4. OBESITY   5. Type II or unspecified type diabetes mellitus without mention of complication, not stated as uncontrolled   6. ESOPHAGITIS, REFLUX   7. HIATAL HERNIA   8. CONSTIPATION   9. Complicated UTI (urinary tract infection)    Orders Placed This Encounter  Procedures  . CMP14+EGFR  . NMR, lipoprofile  . POCT urinalysis dipstick  . POCT UA - Microscopic Only  . POCT UA - Microalbumin  . POCT glycosylated hemoglobin (Hb A1C)   Meds ordered this encounter  Medications  . cefTRIAXone (ROCEPHIN) injection 500 mg    Sig:     Order Specific Question:  Antibiotic Indication:    Answer:  UTI   Force fluids Labs pending Health maintenance reviewed Diet and exercise encouraged Continue all meds Follow up  In 3 months and prn  [name]-Margaret Hassell Done, FNP

## 2014-04-30 ENCOUNTER — Telehealth: Payer: Self-pay | Admitting: Family Medicine

## 2014-04-30 LAB — NMR, LIPOPROFILE
Cholesterol: 209 mg/dL — ABNORMAL HIGH (ref 100–199)
HDL Cholesterol by NMR: 49 mg/dL (ref >39)
HDL Particle Number: 31.1 umol/L (ref >=30.5)
LDL Particle Number: 1271 nmol/L — ABNORMAL HIGH (ref <1000)
LDL Size: 21 nm (ref >20.5)
LDLC SERPL CALC-MCNC: 130 mg/dL — ABNORMAL HIGH (ref 0–99)
LP-IR Score: 64 — ABNORMAL HIGH (ref <=45)
SMALL LDL PARTICLE NUMBER: 464 nmol/L (ref <=527)
TRIGLYCERIDES BY NMR: 150 mg/dL — AB (ref 0–149)

## 2014-04-30 LAB — CMP14+EGFR
A/G RATIO: 1.9 (ref 1.1–2.5)
ALT: 16 IU/L (ref 0–32)
AST: 15 IU/L (ref 0–40)
Albumin: 4.4 g/dL (ref 3.5–5.5)
Alkaline Phosphatase: 53 IU/L (ref 39–117)
BUN/Creatinine Ratio: 12 (ref 9–23)
BUN: 8 mg/dL (ref 6–24)
CO2: 29 mmol/L (ref 18–29)
Calcium: 9.8 mg/dL (ref 8.7–10.2)
Chloride: 97 mmol/L (ref 97–108)
Creatinine, Ser: 0.66 mg/dL (ref 0.57–1.00)
GFR calc Af Amer: 117 mL/min/{1.73_m2} (ref >59)
GFR, EST NON AFRICAN AMERICAN: 102 mL/min/{1.73_m2} (ref >59)
Globulin, Total: 2.3 g/dL (ref 1.5–4.5)
Glucose: 94 mg/dL (ref 65–99)
Potassium: 4 mmol/L (ref 3.5–5.2)
Sodium: 140 mmol/L (ref 134–144)
Total Bilirubin: 0.2 mg/dL (ref 0.0–1.2)
Total Protein: 6.7 g/dL (ref 6.0–8.5)

## 2014-04-30 NOTE — Telephone Encounter (Signed)
Message copied by Waverly Ferrari on Tue Apr 30, 2014  8:47 AM ------      Message from: Chevis Pretty      Created: Tue Apr 30, 2014  8:28 AM       hgba1c excellent      Urine clear      microalbumin negative      LDL particle numbers have really improved- LD are worse-       Continue current meds- low fat diet and exercise and recheck in 3 months       ------

## 2014-05-31 ENCOUNTER — Other Ambulatory Visit: Payer: Self-pay | Admitting: Nurse Practitioner

## 2014-05-31 MED ORDER — GLUCOSE BLOOD VI STRP: ORAL_STRIP | Status: DC

## 2014-06-13 ENCOUNTER — Encounter: Payer: Self-pay | Admitting: Gastroenterology

## 2014-07-29 ENCOUNTER — Other Ambulatory Visit: Payer: Self-pay | Admitting: Nurse Practitioner

## 2014-07-29 ENCOUNTER — Other Ambulatory Visit: Payer: Self-pay | Admitting: *Deleted

## 2014-07-29 MED ORDER — OMEPRAZOLE 20 MG PO CPDR: 20.0000 mg | DELAYED_RELEASE_CAPSULE | Freq: Every day | ORAL | Status: DC

## 2014-08-02 ENCOUNTER — Other Ambulatory Visit (INDEPENDENT_AMBULATORY_CARE_PROVIDER_SITE_OTHER): Payer: 59

## 2014-08-02 ENCOUNTER — Other Ambulatory Visit: Payer: Self-pay | Admitting: Nurse Practitioner

## 2014-08-02 DIAGNOSIS — R3915 Urgency of urination: Secondary | ICD-10-CM

## 2014-08-02 LAB — POCT UA - MICROSCOPIC ONLY
Casts, Ur, LPF, POC: NEGATIVE
Crystals, Ur, HPF, POC: NEGATIVE
Mucus, UA: NEGATIVE
YEAST UA: NEGATIVE

## 2014-08-02 LAB — POCT URINALYSIS DIPSTICK
BILIRUBIN UA: NEGATIVE
Glucose, UA: NEGATIVE
Ketones, UA: NEGATIVE
Leukocytes, UA: NEGATIVE
NITRITE UA: NEGATIVE
PH UA: 7
Protein, UA: NEGATIVE
Spec Grav, UA: <=1.005
Urobilinogen, UA: NEGATIVE

## 2014-08-02 NOTE — Progress Notes (Signed)
Lab only 

## 2014-08-06 ENCOUNTER — Telehealth: Payer: Self-pay | Admitting: *Deleted

## 2014-08-06 NOTE — Telephone Encounter (Signed)
-----   Message from Chevis Pretty, Cucumber sent at 08/02/2014  3:20 PM EDT ----- Urine clear

## 2014-08-06 NOTE — Telephone Encounter (Signed)
Aware. 

## 2014-09-04 ENCOUNTER — Ambulatory Visit (INDEPENDENT_AMBULATORY_CARE_PROVIDER_SITE_OTHER): Payer: 59 | Admitting: Family Medicine

## 2014-09-04 ENCOUNTER — Encounter: Payer: Self-pay | Admitting: Family Medicine

## 2014-09-04 VITALS — BP 154/80 | HR 75 | Temp 97.9°F | Ht 67.0 in | Wt 232.4 lb

## 2014-09-04 DIAGNOSIS — M25511 Pain in right shoulder: Secondary | ICD-10-CM

## 2014-09-04 NOTE — Progress Notes (Signed)
   Subjective:    Patient ID: [name], female    DOB: Apr 11, 1961, 53 y.o.   MRN: [id number]  HPI 53 year old female with onset last week of pain in her upper back medial to the right scapula. She felt onset of this pain when she was reaching to her extreme left side. There is been no further injury. There is no neck pain. There is no radicular pain in her right arm. She is taking ibuprofen 600 mg 3 times a day and using massage with minimal relief    Review of Systems  Constitutional: Negative.   HENT: Negative.   Eyes: Negative.   Respiratory: Negative.   Cardiovascular: Negative.   Gastrointestinal: Negative.   Endocrine: Negative.   Genitourinary: Negative.   Musculoskeletal: Positive for myalgias.  Hematological: Negative.   Psychiatric/Behavioral: Negative.        Objective:   Physical Exam  Musculoskeletal:  Exam confined to area of maximal tenderness. There is a trigger point medial to the right scapula. I can feel no definite lump such as tight muscle and in my experience this typically is related to strain or sprain of tendon with resultant inflammation in the area. There may be a muscle component as well.  The point of maximal tenderness was palpated and identified with patient's help in this area was injected with 1 mL of Marcaine 1 mL of Aristocort    BP 154/80 mmHg  Pulse 75  Temp(Src) 97.9 F (36.6 C) (Oral)  Ht 5\' 7"  (1.702 m)  Wt 232 lb 6.4 oz (105.416 kg)  BMI 36.39 kg/m2      Assessment & Plan:  1. Pain in joint, shoulder region, right Injected as noted above. Continue with ibuprofen 6 800 mg 3 times a day. Massage with tennis ball or similar object. May apply heat or ice or combination depending on response  Wardell Honour MD

## 2014-09-13 ENCOUNTER — Encounter: Payer: 59 | Admitting: Nurse Practitioner

## 2014-09-16 ENCOUNTER — Other Ambulatory Visit: Payer: Self-pay | Admitting: *Deleted

## 2014-09-16 ENCOUNTER — Other Ambulatory Visit (INDEPENDENT_AMBULATORY_CARE_PROVIDER_SITE_OTHER): Payer: 59

## 2014-09-16 DIAGNOSIS — R300 Dysuria: Secondary | ICD-10-CM

## 2014-09-16 LAB — POCT URINALYSIS DIPSTICK
Bilirubin, UA: NEGATIVE
Blood, UA: NEGATIVE
GLUCOSE UA: NEGATIVE
Ketones, UA: NEGATIVE
Leukocytes, UA: NEGATIVE
NITRITE UA: NEGATIVE
Protein, UA: NEGATIVE
Spec Grav, UA: 1.01
UROBILINOGEN UA: NEGATIVE
pH, UA: 7

## 2014-09-16 LAB — POCT UA - MICROSCOPIC ONLY
Bacteria, U Microscopic: NEGATIVE
CASTS, UR, LPF, POC: NEGATIVE
Crystals, Ur, HPF, POC: NEGATIVE
Mucus, UA: NEGATIVE
YEAST UA: NEGATIVE

## 2014-09-16 NOTE — Progress Notes (Signed)
Lab only 

## 2014-11-28 ENCOUNTER — Ambulatory Visit (INDEPENDENT_AMBULATORY_CARE_PROVIDER_SITE_OTHER): Payer: 59

## 2014-11-28 ENCOUNTER — Other Ambulatory Visit: Payer: Self-pay | Admitting: Orthopedic Surgery

## 2014-11-28 ENCOUNTER — Other Ambulatory Visit: Payer: 59

## 2014-11-28 DIAGNOSIS — R52 Pain, unspecified: Secondary | ICD-10-CM

## 2014-11-28 DIAGNOSIS — M25562 Pain in left knee: Secondary | ICD-10-CM

## 2014-12-03 ENCOUNTER — Telehealth: Payer: Self-pay | Admitting: *Deleted

## 2014-12-03 MED ORDER — MELOXICAM 15 MG PO TABS: 15.0000 mg | ORAL_TABLET | Freq: Every day | ORAL | Status: DC

## 2014-12-03 NOTE — Telephone Encounter (Signed)
Patient would like a rx for mobic. Please advise

## 2015-01-09 ENCOUNTER — Other Ambulatory Visit: Payer: Self-pay | Admitting: *Deleted

## 2015-01-09 ENCOUNTER — Other Ambulatory Visit (INDEPENDENT_AMBULATORY_CARE_PROVIDER_SITE_OTHER): Payer: 59

## 2015-01-09 DIAGNOSIS — R300 Dysuria: Secondary | ICD-10-CM

## 2015-01-09 LAB — POCT UA - MICROSCOPIC ONLY
CASTS, UR, LPF, POC: NEGATIVE
CRYSTALS, UR, HPF, POC: NEGATIVE
Mucus, UA: NEGATIVE
YEAST UA: NEGATIVE

## 2015-01-09 LAB — POCT URINALYSIS DIPSTICK
BILIRUBIN UA: NEGATIVE
Glucose, UA: NEGATIVE
KETONES UA: NEGATIVE
Nitrite, UA: NEGATIVE
Protein, UA: NEGATIVE
Urobilinogen, UA: NEGATIVE
pH, UA: 7

## 2015-01-09 NOTE — Progress Notes (Signed)
Lab only 

## 2015-01-13 ENCOUNTER — Ambulatory Visit: Payer: 59 | Admitting: Nurse Practitioner

## 2015-01-16 ENCOUNTER — Encounter (INDEPENDENT_AMBULATORY_CARE_PROVIDER_SITE_OTHER): Payer: Self-pay

## 2015-01-16 ENCOUNTER — Ambulatory Visit (INDEPENDENT_AMBULATORY_CARE_PROVIDER_SITE_OTHER): Payer: 59 | Admitting: Physician Assistant

## 2015-01-16 ENCOUNTER — Encounter: Payer: Self-pay | Admitting: Physician Assistant

## 2015-01-16 VITALS — BP 146/80 | HR 105 | Temp 96.8°F | Ht 67.0 in | Wt 244.0 lb

## 2015-01-16 DIAGNOSIS — D239 Other benign neoplasm of skin, unspecified: Secondary | ICD-10-CM

## 2015-01-16 DIAGNOSIS — L821 Other seborrheic keratosis: Secondary | ICD-10-CM | POA: Diagnosis not present

## 2015-01-16 DIAGNOSIS — D229 Melanocytic nevi, unspecified: Secondary | ICD-10-CM

## 2015-01-16 NOTE — Progress Notes (Signed)
   Subjective:    Patient ID: [name], female    DOB: [birth year], 54 y.o.   MRN: [id number]  HPI 54 y/o female presents for new appearing mole on left hand and wart on right arm   Review of Systems  Skin:       New mole on left hand Wart on right arm Skin tag on right cheek        Objective:   Physical Exam  Skin:  Atypical appearing nevi on dorsal surface of left hand, possible blue nevus Hyperkeratotic lesion on posterior right arm and right temple          Assessment & Plan:  1. Atypical nevi on left hand biopsied to r/o dysplasia. Hyfrecation applied. Await path and notify patient accordingly   2. Hyperkeratotic lesion on posterior right arm removed via wider shave.   3. Hyperkeratotic skin tag on right cheek/temple area tx'd with cryosurgery x 1

## 2015-01-21 LAB — PATHOLOGY

## 2015-01-22 ENCOUNTER — Other Ambulatory Visit: Payer: Self-pay | Admitting: Family Medicine

## 2015-01-22 ENCOUNTER — Encounter: Payer: Self-pay | Admitting: Nurse Practitioner

## 2015-01-22 ENCOUNTER — Ambulatory Visit (INDEPENDENT_AMBULATORY_CARE_PROVIDER_SITE_OTHER): Payer: 59 | Admitting: Nurse Practitioner

## 2015-01-22 VITALS — BP 121/73 | HR 96 | Temp 98.6°F | Ht 67.0 in | Wt 244.0 lb

## 2015-01-22 DIAGNOSIS — L989 Disorder of the skin and subcutaneous tissue, unspecified: Secondary | ICD-10-CM

## 2015-01-22 DIAGNOSIS — Q828 Other specified congenital malformations of skin: Secondary | ICD-10-CM

## 2015-01-22 NOTE — Progress Notes (Addendum)
   Subjective:    Patient ID: [name], female    DOB: [birth year], 54 y.o.   MRN: [id number]  HPI Patient in today for removal of multiple skin tags. Axilla bil, mammory fold and pubic area    Review of Systems  All other systems reviewed and are negative.      Objective:   Physical Exam  Constitutional: She appears well-developed and well-nourished.  Cardiovascular: Normal rate and normal heart sounds.   Pulmonary/Chest: Effort normal and breath sounds normal.  Skin:  Multiple skin tags of varying sizes bil axillary, bil mammory fold and pubic area    Procedure: Betadine to clean- snip with stevens scissors- monsel's for cautery      Assessment & Plan:   1. Accessory skin tags    Watch for signs of infection RTO prn  [name]-Margaret Hassell Done, FNP

## 2015-02-28 ENCOUNTER — Other Ambulatory Visit: Payer: 59

## 2015-02-28 ENCOUNTER — Telehealth: Payer: Self-pay | Admitting: *Deleted

## 2015-02-28 ENCOUNTER — Other Ambulatory Visit (INDEPENDENT_AMBULATORY_CARE_PROVIDER_SITE_OTHER): Payer: 59 | Admitting: *Deleted

## 2015-02-28 DIAGNOSIS — R300 Dysuria: Secondary | ICD-10-CM

## 2015-02-28 LAB — POCT URINALYSIS DIPSTICK
Bilirubin, UA: NEGATIVE
Glucose, UA: NEGATIVE
Ketones, UA: NEGATIVE
Nitrite, UA: NEGATIVE
PH UA: 6.5
Protein, UA: NEGATIVE
Spec Grav, UA: <=1.005
UROBILINOGEN UA: NEGATIVE

## 2015-02-28 LAB — POCT UA - MICROSCOPIC ONLY
BACTERIA, U MICROSCOPIC: NEGATIVE
CASTS, UR, LPF, POC: NEGATIVE
CRYSTALS, UR, HPF, POC: NEGATIVE
Mucus, UA: NEGATIVE
YEAST UA: NEGATIVE

## 2015-02-28 NOTE — Progress Notes (Signed)
Lab only 

## 2015-02-28 NOTE — Telephone Encounter (Signed)
FYI- Pt feels like she has a UTI. She has had these in the past. Currently complains of dysuria and pressure for a few days.  She had Bactrim at home and started taking that yesterday and has been taking cranberry juice.  U/A done today which showed 5-10 WBC and 5-10 RBC. Culture ordered since she has started an antibiotic already to see if it is the appropriate medication. She advised that Levaquin usually works well for her.

## 2015-03-02 LAB — URINE CULTURE

## 2015-03-03 ENCOUNTER — Other Ambulatory Visit: Payer: Self-pay | Admitting: Family

## 2015-03-03 MED ORDER — NITROFURANTOIN MONOHYD MACRO 100 MG PO CAPS: 100.0000 mg | ORAL_CAPSULE | Freq: Two times a day (BID) | ORAL | Status: DC

## 2015-03-05 MED ORDER — AMOXICILLIN-POT CLAVULANATE 875-125 MG PO TABS: 1.0000 | ORAL_TABLET | Freq: Two times a day (BID) | ORAL | Status: DC

## 2015-03-05 MED ORDER — FLUCONAZOLE 150 MG PO TABS: 150.0000 mg | ORAL_TABLET | Freq: Once | ORAL | Status: DC

## 2015-03-05 NOTE — Telephone Encounter (Signed)
Levaquin doesn't usually work for her. Would you consider Augmentin which it is also sensitive to? She will also need Diflucan.

## 2015-03-17 ENCOUNTER — Encounter: Payer: Self-pay | Admitting: Internal Medicine

## 2015-04-02 ENCOUNTER — Encounter: Payer: Self-pay | Admitting: Physician Assistant

## 2015-04-02 ENCOUNTER — Ambulatory Visit (INDEPENDENT_AMBULATORY_CARE_PROVIDER_SITE_OTHER): Payer: 59 | Admitting: Physician Assistant

## 2015-04-02 VITALS — BP 129/76 | HR 101 | Temp 97.3°F | Ht 67.0 in | Wt 239.8 lb

## 2015-04-02 DIAGNOSIS — Z0000 Encounter for general adult medical examination without abnormal findings: Secondary | ICD-10-CM

## 2015-04-02 DIAGNOSIS — K2100 Gastro-esophageal reflux disease with esophagitis, without bleeding: Secondary | ICD-10-CM

## 2015-04-02 DIAGNOSIS — E785 Hyperlipidemia, unspecified: Secondary | ICD-10-CM

## 2015-04-02 DIAGNOSIS — R739 Hyperglycemia, unspecified: Secondary | ICD-10-CM

## 2015-04-02 NOTE — Patient Instructions (Signed)

## 2015-04-02 NOTE — Progress Notes (Signed)
Subjective:     Patient ID: [name], female   DOB: [birth year], 54 y.o.   MRN: [id number]  HPI Pt here for PE She has had recent pap and mammogram done No real concerns complaints  Review of Systems  Constitutional: Negative.   HENT: Negative.   Eyes: Negative.   Respiratory: Negative.   Cardiovascular: Negative.   Gastrointestinal: Negative.   Endocrine: Negative.   Genitourinary: Negative.   Musculoskeletal: Negative.   Neurological: Negative.   Psychiatric/Behavioral: Negative.        Objective:   Physical Exam  Constitutional: She is oriented to person, place, and time. She appears well-developed and well-nourished.  HENT:  Head: Normocephalic and atraumatic.  Right Ear: External ear normal.  Left Ear: External ear normal.  Mouth/Throat: Oropharynx is clear and moist. No oropharyngeal exudate.  Eyes: Conjunctivae and EOM are normal. Pupils are equal, round, and reactive to light.  Neck: Normal range of motion. Neck supple. No JVD present. No thyromegaly present.  Cardiovascular: Normal rate, regular rhythm, normal heart sounds and intact distal pulses.   Pulmonary/Chest: Effort normal and breath sounds normal.  Abdominal: Soft. Bowel sounds are normal. She exhibits no distension and no mass. There is no tenderness. There is no rebound and no guarding.  Musculoskeletal: Normal range of motion. She exhibits no edema.  Crepitus to the R knee with ROM  Lymphadenopathy:    She has no cervical adenopathy.  Neurological: She is alert and oriented to person, place, and time. She has normal reflexes.  Skin: Skin is warm and dry.  Psychiatric: She has a normal mood and affect. Her behavior is normal. Judgment and thought content normal.  Nursing note and vitals reviewed.      Assessment:     1. Physical exam   2. ESOPHAGITIS, REFLUX   3. Hyperlipidemia with target LDL less than 100   4. Elevated blood sugar        Plan:     Pt not fasting so pt will have labs  done tomm She is up to date on eye and dental exams Pt having f/u with GI and colonoscopy later this year Will inform of lab results Continue with diet and exercise F/U pending labs

## 2015-04-17 ENCOUNTER — Other Ambulatory Visit (INDEPENDENT_AMBULATORY_CARE_PROVIDER_SITE_OTHER): Payer: 59

## 2015-04-17 DIAGNOSIS — R739 Hyperglycemia, unspecified: Secondary | ICD-10-CM

## 2015-04-17 DIAGNOSIS — E785 Hyperlipidemia, unspecified: Secondary | ICD-10-CM | POA: Diagnosis not present

## 2015-04-17 DIAGNOSIS — R7309 Other abnormal glucose: Secondary | ICD-10-CM | POA: Diagnosis not present

## 2015-04-17 LAB — POCT GLYCOSYLATED HEMOGLOBIN (HGB A1C): Hemoglobin A1C: 6.7

## 2015-04-18 LAB — LIPID PANEL
CHOL/HDL RATIO: 5 ratio — AB (ref 0.0–4.4)
CHOLESTEROL TOTAL: 208 mg/dL — AB (ref 100–199)
HDL: 42 mg/dL (ref >39)
LDL CALC: 117 mg/dL — AB (ref 0–99)
Triglycerides: 244 mg/dL — ABNORMAL HIGH (ref 0–149)
VLDL Cholesterol Cal: 49 mg/dL — ABNORMAL HIGH (ref 5–40)

## 2015-04-18 LAB — CMP14+EGFR
A/G RATIO: 1.5 (ref 1.1–2.5)
ALT: 20 IU/L (ref 0–32)
AST: 15 IU/L (ref 0–40)
Albumin: 4.3 g/dL (ref 3.5–5.5)
Alkaline Phosphatase: 54 IU/L (ref 39–117)
BILIRUBIN TOTAL: 0.3 mg/dL (ref 0.0–1.2)
BUN / CREAT RATIO: 12 (ref 9–23)
BUN: 9 mg/dL (ref 6–24)
CHLORIDE: 97 mmol/L (ref 97–108)
CO2: 28 mmol/L (ref 18–29)
Calcium: 9.8 mg/dL (ref 8.7–10.2)
Creatinine, Ser: 0.73 mg/dL (ref 0.57–1.00)
GFR calc non Af Amer: 94 mL/min/{1.73_m2} (ref >59)
GFR, EST AFRICAN AMERICAN: 109 mL/min/{1.73_m2} (ref >59)
Globulin, Total: 2.8 g/dL (ref 1.5–4.5)
Glucose: 139 mg/dL — ABNORMAL HIGH (ref 65–99)
Potassium: 4.3 mmol/L (ref 3.5–5.2)
Sodium: 141 mmol/L (ref 134–144)
TOTAL PROTEIN: 7.1 g/dL (ref 6.0–8.5)

## 2015-04-23 ENCOUNTER — Encounter: Payer: Self-pay | Admitting: Physician Assistant

## 2015-04-23 ENCOUNTER — Other Ambulatory Visit: Payer: Self-pay | Admitting: *Deleted

## 2015-04-23 ENCOUNTER — Ambulatory Visit (INDEPENDENT_AMBULATORY_CARE_PROVIDER_SITE_OTHER): Payer: 59 | Admitting: Physician Assistant

## 2015-04-23 VITALS — BP 138/76 | HR 89 | Temp 97.6°F | Ht 67.0 in | Wt 242.4 lb

## 2015-04-23 DIAGNOSIS — R040 Epistaxis: Secondary | ICD-10-CM | POA: Diagnosis not present

## 2015-04-23 MED ORDER — METFORMIN HCL 500 MG PO TABS: 500.0000 mg | ORAL_TABLET | Freq: Two times a day (BID) | ORAL | Status: DC

## 2015-04-23 MED ORDER — RAMIPRIL 10 MG PO CAPS: ORAL_CAPSULE | ORAL | Status: DC

## 2015-04-23 MED ORDER — FUROSEMIDE 40 MG PO TABS: 40.0000 mg | ORAL_TABLET | Freq: Every day | ORAL | Status: DC

## 2015-04-23 NOTE — Patient Instructions (Signed)

## 2015-04-23 NOTE — Progress Notes (Signed)
Subjective:     Patient ID: [name], female   DOB: Dec 10, 1960, 54 y.o.   MRN: [id number]  HPI Pt with intermit nosebleeds over the last week These are quickly controlled with pressure No pain Sl sinus fullness  Review of Systems  Constitutional: Negative.   HENT: Positive for congestion, nosebleeds, postnasal drip and sinus pressure. Negative for rhinorrhea.        Objective:   Physical Exam  Nursing note and vitals reviewed. Both nares patent No active bleeding seen Septum with erythema but no lesions Turb erythem/ edem     Assessment:     epistaxis    Plan:     Saline nasal spray OTC antihist Hold blowing nose F/U prn

## 2015-05-09 ENCOUNTER — Encounter: Payer: Self-pay | Admitting: *Deleted

## 2015-05-22 ENCOUNTER — Ambulatory Visit: Payer: 59 | Admitting: Internal Medicine

## 2015-05-23 ENCOUNTER — Other Ambulatory Visit: Payer: Self-pay | Admitting: Nurse Practitioner

## 2015-05-23 MED ORDER — CIPROFLOXACIN-DEXAMETHASONE 0.3-0.1 % OT SUSP: 4.0000 [drp] | Freq: Two times a day (BID) | OTIC | Status: DC

## 2015-05-27 DIAGNOSIS — D126 Benign neoplasm of colon, unspecified: Secondary | ICD-10-CM

## 2015-05-27 HISTORY — DX: Benign neoplasm of colon, unspecified: D12.6

## 2015-06-10 ENCOUNTER — Other Ambulatory Visit (INDEPENDENT_AMBULATORY_CARE_PROVIDER_SITE_OTHER): Payer: 59

## 2015-06-10 ENCOUNTER — Other Ambulatory Visit: Payer: Self-pay | Admitting: *Deleted

## 2015-06-10 DIAGNOSIS — R300 Dysuria: Secondary | ICD-10-CM | POA: Diagnosis not present

## 2015-06-10 LAB — POCT URINALYSIS DIPSTICK
Bilirubin, UA: NEGATIVE
Glucose, UA: NEGATIVE
Ketones, UA: NEGATIVE
Leukocytes, UA: NEGATIVE
NITRITE UA: NEGATIVE
PROTEIN UA: NEGATIVE
RBC UA: NEGATIVE
SPEC GRAV UA: 1.01
UROBILINOGEN UA: NEGATIVE
pH, UA: 5

## 2015-06-10 LAB — POCT UA - MICROSCOPIC ONLY
Bacteria, U Microscopic: NEGATIVE
Casts, Ur, LPF, POC: NEGATIVE
Crystals, Ur, HPF, POC: NEGATIVE
Mucus, UA: NEGATIVE
RBC, urine, microscopic: NEGATIVE
WBC, UR, HPF, POC: NEGATIVE
YEAST UA: NEGATIVE

## 2015-06-10 NOTE — Progress Notes (Signed)
Lab only 

## 2015-06-12 ENCOUNTER — Other Ambulatory Visit (INDEPENDENT_AMBULATORY_CARE_PROVIDER_SITE_OTHER): Payer: 59 | Admitting: Nurse Practitioner

## 2015-06-12 ENCOUNTER — Other Ambulatory Visit: Payer: 59

## 2015-06-12 DIAGNOSIS — R300 Dysuria: Secondary | ICD-10-CM | POA: Diagnosis not present

## 2015-06-12 DIAGNOSIS — R399 Unspecified symptoms and signs involving the genitourinary system: Secondary | ICD-10-CM

## 2015-06-12 LAB — POCT UA - MICROSCOPIC ONLY
CASTS, UR, LPF, POC: NEGATIVE
CRYSTALS, UR, HPF, POC: NEGATIVE
Mucus, UA: NEGATIVE
Yeast, UA: NEGATIVE

## 2015-06-12 LAB — POCT URINALYSIS DIPSTICK
BILIRUBIN UA: NEGATIVE
Glucose, UA: NEGATIVE
KETONES UA: NEGATIVE
NITRITE UA: NEGATIVE
PH UA: 5
Protein, UA: NEGATIVE
RBC UA: NEGATIVE
Spec Grav, UA: <=1.005
Urobilinogen, UA: NEGATIVE

## 2015-07-29 ENCOUNTER — Ambulatory Visit: Payer: 59 | Admitting: Internal Medicine

## 2015-08-04 ENCOUNTER — Ambulatory Visit (INDEPENDENT_AMBULATORY_CARE_PROVIDER_SITE_OTHER): Payer: 59 | Admitting: Family Medicine

## 2015-08-04 ENCOUNTER — Encounter: Payer: Self-pay | Admitting: Family Medicine

## 2015-08-04 VITALS — BP 129/83 | HR 89 | Temp 98.1°F | Ht 67.0 in | Wt 242.6 lb

## 2015-08-04 DIAGNOSIS — M2550 Pain in unspecified joint: Secondary | ICD-10-CM

## 2015-08-04 DIAGNOSIS — G4730 Sleep apnea, unspecified: Secondary | ICD-10-CM

## 2015-08-04 DIAGNOSIS — F458 Other somatoform disorders: Secondary | ICD-10-CM

## 2015-08-04 DIAGNOSIS — R0989 Other specified symptoms and signs involving the circulatory and respiratory systems: Secondary | ICD-10-CM

## 2015-08-04 NOTE — Progress Notes (Signed)
BP 129/83 mmHg  Pulse 89  Temp(Src) 98.1 F (36.7 C) (Oral)  Ht 5\' 7"  (1.702 m)  Wt 242 lb 9.6 oz (110.043 kg)  BMI 37.99 kg/m2   Subjective:    Patient ID: [name], female    DOB: November 04, 1960, 54 y.o.   MRN: [id number]  HPI: [name] is a 54 y.o. female presenting on 08/04/2015 for Bilateral feet and leg pain and Dysphagia   HPI Joint and muscle pain Patient has been having pain and achy sensation in bilateral lower extremities and knees legs and ankles calves and feet. This is been increasing over the past few months. Patient comes in today because her husband encouraged her to come in and get checked out. The pain is especially worse at night that she feels like she has to get up and move around. She has noticed that it is better on the days when she works out.  Difficulty swallowing Patient noticed that when she has a dry her mouth that sometimes she feels like her saliva gets stuck in the back of her throat and comes back up. She has not necessarily noticed any difficulty swallowing foods or liquids specifically, just more happens with the dry mouth and saliva in the back of her mouth. She does have a history of GERD and has been using some medications occasionally for that.  Sleep issues Patient admits to snoring at night and possibly having some apneic symptoms of awakening gasping for air. Her husband told her this on multiple occasions.  Relevant past medical, surgical, family and social history reviewed and updated as indicated. Interim medical history since our last visit reviewed. Allergies and medications reviewed and updated.  Review of Systems  Constitutional: Negative for fever and chills.  HENT: Negative for congestion, ear discharge and ear pain.   Eyes: Negative for redness and visual disturbance.  Respiratory: Negative for chest tightness and shortness of breath.   Cardiovascular: Negative for chest pain and leg swelling.  Gastrointestinal: Positive  for nausea (heartburn and saliva getting stuck in throat). Negative for vomiting.  Genitourinary: Negative for dysuria and difficulty urinating.  Musculoskeletal: Positive for myalgias. Negative for back pain and gait problem.  Skin: Negative for rash.  Neurological: Negative for light-headedness and headaches.  Psychiatric/Behavioral: Positive for sleep disturbance. Negative for behavioral problems and agitation.  All other systems reviewed and are negative.   Per HPI unless specifically indicated above     Medication List       This list is accurate as of: 08/04/15  2:14 PM.  Always use your most recent med list.               ALPRAZolam 0.5 MG tablet  Commonly known as:  XANAX  Take 1 tablet (0.5 mg total) by mouth at bedtime as needed for sleep.     aspirin EC 81 MG tablet  Take 81 mg by mouth daily.     ciprofloxacin-dexamethasone otic suspension  Commonly known as:  CIPRODEX  Place 4 drops into both ears 2 (two) times daily.     fluconazole 150 MG tablet  Commonly known as:  DIFLUCAN  Take 1 tablet (150 mg total) by mouth once.     furosemide 40 MG tablet  Commonly known as:  LASIX  Take 1 tablet (40 mg total) by mouth daily.     glucose blood test strip  Commonly known as:  ONETOUCH VERIO  Check fasting blood sugar 1X a day and prn  Dx 250.01     metFORMIN 500 MG tablet  Commonly known as:  GLUCOPHAGE  Take 1 tablet (500 mg total) by mouth 2 (two) times daily.     omeprazole 20 MG capsule  Commonly known as:  PRILOSEC  Take 1 capsule (20 mg total) by mouth daily.     ramipril 10 MG capsule  Commonly known as:  ALTACE  TAKE 2 CAPSULES BY MOUTH ONCE DAILY.           Objective:    BP 129/83 mmHg  Pulse 89  Temp(Src) 98.1 F (36.7 C) (Oral)  Ht 5\' 7"  (1.702 m)  Wt 242 lb 9.6 oz (110.043 kg)  BMI 37.99 kg/m2  Wt Readings from Last 3 Encounters:  08/04/15 242 lb 9.6 oz (110.043 kg)  04/23/15 242 lb 6.4 oz (109.952 kg)  04/02/15 239 lb 12.8  oz (108.773 kg)    Physical Exam  Constitutional: She is oriented to person, place, and time. She appears well-developed and well-nourished. No distress.  HENT:  Nose: Nose normal.  Mouth/Throat: Uvula is midline, oropharynx is clear and moist and mucous membranes are normal.  Mallampati of 3  Eyes: Conjunctivae and EOM are normal. Pupils are equal, round, and reactive to light.  Cardiovascular: Normal rate, regular rhythm, normal heart sounds and intact distal pulses.   No murmur heard. Pulmonary/Chest: Effort normal and breath sounds normal. No respiratory distress. She has no wheezes.  Abdominal: Soft. Bowel sounds are normal. She exhibits no distension. There is no tenderness. There is no rebound.  Musculoskeletal: Normal range of motion. She exhibits tenderness (bilateral lower extremity tenderness to touch over her muscles and under arches of feet especially in the calf as well.). She exhibits no edema.  Neurological: She is alert and oriented to person, place, and time. Coordination normal.  Skin: Skin is warm and dry. No rash noted. She is not diaphoretic.  Psychiatric: She has a normal mood and affect. Her behavior is normal.  Nursing note and vitals reviewed.   Results for orders placed or performed in visit on 06/12/15  POCT urinalysis dipstick  Result Value Ref Range   Color, UA gold    Clarity, UA clear    Glucose, UA neg    Bilirubin, UA neg    Ketones, UA neg    Spec Grav, UA <=1.005    Blood, UA neg    pH, UA 5.0    Protein, UA neg    Urobilinogen, UA negative    Nitrite, UA neg    Leukocytes, UA small (1+) (A) Negative  POCT UA - Microscopic Only  Result Value Ref Range   WBC, Ur, HPF, POC 1-3    RBC, urine, microscopic occ    Bacteria, U Microscopic occ    Mucus, UA neg    Epithelial cells, urine per micros few    Crystals, Ur, HPF, POC neg    Casts, Ur, LPF, POC neg    Yeast, UA neg       Assessment & Plan:       Problem List Items Addressed This  Visit    None    Visit Diagnoses    Joint pain    -  Primary    Diffuse joint pain in legs and feet and occasionally in both, worse in the morning and after prolonged periods of resting, improves throughout the day    Relevant Orders    Rocky mtn spotted fvr abs pnl(IgG+IgM)    Lyme Ab/Western Blot  Reflex    Vit D  25 hydroxy (rtn osteoporosis monitoring)    Vitamin B12    TSH    Rheumatoid factor    Sedimentation rate    C-reactive protein    Globus sensation        Has diagnosis of GERD and hiatal hernia, already has an appointment to see GI in December and will discuss this with them at that time    Observed sleep apnea        Relevant Orders    Ambulatory referral to Sleep Studies        Follow up plan: No Follow-up on file.  Caryl Pina, MD [facility] 08/04/2015, 2:14 PM

## 2015-08-06 LAB — TSH: TSH: 1.77 u[IU]/mL (ref 0.450–4.500)

## 2015-08-06 LAB — C-REACTIVE PROTEIN: CRP: 5.5 mg/L — AB (ref 0.0–4.9)

## 2015-08-06 LAB — VITAMIN B12: Vitamin B-12: 280 pg/mL (ref 211–946)

## 2015-08-06 LAB — LYME AB/WESTERN BLOT REFLEX: LYME DISEASE AB, QUANT, IGM: <0.8 index (ref 0.00–0.79)

## 2015-08-06 LAB — ROCKY MTN SPOTTED FVR ABS PNL(IGG+IGM)
RMSF IGG: NEGATIVE
RMSF IGM: 0.73 {index} (ref 0.00–0.89)

## 2015-08-06 LAB — SEDIMENTATION RATE: Sed Rate: 2 mm/hr (ref 0–40)

## 2015-08-06 LAB — RHEUMATOID FACTOR: Rhuematoid fact SerPl-aCnc: <10 IU/mL (ref 0.0–13.9)

## 2015-08-06 LAB — VITAMIN D 25 HYDROXY (VIT D DEFICIENCY, FRACTURES): Vit D, 25-Hydroxy: 30 ng/mL (ref 30.0–100.0)

## 2015-08-11 ENCOUNTER — Ambulatory Visit (INDEPENDENT_AMBULATORY_CARE_PROVIDER_SITE_OTHER): Payer: 59 | Admitting: Family Medicine

## 2015-08-11 ENCOUNTER — Encounter: Payer: Self-pay | Admitting: Family Medicine

## 2015-08-11 VITALS — BP 132/78 | HR 91 | Temp 97.1°F | Ht 67.0 in | Wt 242.6 lb

## 2015-08-11 DIAGNOSIS — K2100 Gastro-esophageal reflux disease with esophagitis, without bleeding: Secondary | ICD-10-CM

## 2015-08-11 MED ORDER — RANITIDINE HCL 150 MG PO TABS: 150.0000 mg | ORAL_TABLET | Freq: Two times a day (BID) | ORAL | Status: DC

## 2015-08-11 NOTE — Assessment & Plan Note (Signed)
Globus sensation in her throat, recommend to get EGD, will add Zantac to her omeprazole and give GI cocktail

## 2015-08-11 NOTE — Progress Notes (Signed)
BP 132/78 mmHg  Pulse 91  Temp(Src) 97.1 F (36.2 C) (Oral)  Ht 5\' 7"  (1.702 m)  Wt 242 lb 9.6 oz (110.043 kg)  BMI 37.99 kg/m2   Subjective:    Patient ID: [name], female    DOB: 1961/07/04, 54 y.o.   MRN: [id number]  HPI: [name] is a 54 y.o. female presenting on 08/11/2015 for Throat issues   HPI Throat issues She has been having a feeling of what she describes as swelling or a globus sensation in her throat. She feels like something is stuck in her throat. She has been having acid come up into her mouth over the night and experiencing increased heartburn. She is currently taking omeprazole 20 mg. She denies any fevers or chills or sinus pressure or nasal congestion.  Relevant past medical, surgical, family and social history reviewed and updated as indicated. Interim medical history since our last visit reviewed. Allergies and medications reviewed and updated.  Review of Systems  Constitutional: Negative for fever and chills.  HENT: Negative for congestion, ear discharge and ear pain.   Eyes: Negative for redness and visual disturbance.  Respiratory: Negative for chest tightness and shortness of breath.   Cardiovascular: Negative for chest pain and leg swelling.  Gastrointestinal: Positive for nausea (heartburn) and abdominal pain.  Genitourinary: Negative for dysuria and difficulty urinating.  Musculoskeletal: Negative for back pain and gait problem.  Skin: Negative for rash.  Neurological: Negative for dizziness, light-headedness and headaches.  Psychiatric/Behavioral: Negative for behavioral problems and agitation.  All other systems reviewed and are negative.   Per HPI unless specifically indicated above     Medication List       This list is accurate as of: 08/11/15 10:59 AM.  Always use your most recent med list.               ALPRAZolam 0.5 MG tablet  Commonly known as:  XANAX  Take 1 tablet (0.5 mg total) by mouth at bedtime as needed for  sleep.     aspirin EC 81 MG tablet  Take 81 mg by mouth daily.     ciprofloxacin-dexamethasone otic suspension  Commonly known as:  CIPRODEX  Place 4 drops into both ears 2 (two) times daily.     fluconazole 150 MG tablet  Commonly known as:  DIFLUCAN  Take 1 tablet (150 mg total) by mouth once.     furosemide 40 MG tablet  Commonly known as:  LASIX  Take 1 tablet (40 mg total) by mouth daily.     glucose blood test strip  Commonly known as:  ONETOUCH VERIO  Check fasting blood sugar 1X a day and prn    Dx 250.01     metFORMIN 500 MG tablet  Commonly known as:  GLUCOPHAGE  Take 1 tablet (500 mg total) by mouth 2 (two) times daily.     omeprazole 20 MG capsule  Commonly known as:  PRILOSEC  Take 1 capsule (20 mg total) by mouth daily.     ramipril 10 MG capsule  Commonly known as:  ALTACE  TAKE 2 CAPSULES BY MOUTH ONCE DAILY.     ranitidine 150 MG tablet  Commonly known as:  ZANTAC  Take 1 tablet (150 mg total) by mouth 2 (two) times daily.           Objective:    BP 132/78 mmHg  Pulse 91  Temp(Src) 97.1 F (36.2 C) (Oral)  Ht 5\' 7"  (1.702  m)  Wt 242 lb 9.6 oz (110.043 kg)  BMI 37.99 kg/m2  Wt Readings from Last 3 Encounters:  08/11/15 242 lb 9.6 oz (110.043 kg)  08/04/15 242 lb 9.6 oz (110.043 kg)  04/23/15 242 lb 6.4 oz (109.952 kg)    Physical Exam  Constitutional: She is oriented to person, place, and time. She appears well-developed and well-nourished. No distress.  HENT:  Right Ear: Tympanic membrane, external ear and ear canal normal.  Left Ear: Tympanic membrane, external ear and ear canal normal.  Nose: Nose normal. Right sinus exhibits no maxillary sinus tenderness and no frontal sinus tenderness. Left sinus exhibits no maxillary sinus tenderness and no frontal sinus tenderness.  Mouth/Throat: Uvula is midline, oropharynx is clear and moist and mucous membranes are normal. No oropharyngeal exudate, posterior oropharyngeal edema, posterior  oropharyngeal erythema or tonsillar abscesses (Tonsillar crypts).  Eyes: Conjunctivae and EOM are normal. Pupils are equal, round, and reactive to light.  Cardiovascular: Normal rate, regular rhythm, normal heart sounds and intact distal pulses.   No murmur heard. Pulmonary/Chest: Effort normal and breath sounds normal. No respiratory distress. She has no wheezes.  Abdominal: Soft. Bowel sounds are normal. She exhibits no distension. There is tenderness (mild epigastric).  Musculoskeletal: Normal range of motion. She exhibits no edema or tenderness.  Neurological: She is alert and oriented to person, place, and time. Coordination normal.  Skin: Skin is warm and dry. No rash noted. She is not diaphoretic.  Psychiatric: She has a normal mood and affect. Her behavior is normal.  Nursing note and vitals reviewed.   Results for orders placed or performed in visit on 08/04/15  Rocky mtn spotted fvr abs pnl(IgG+IgM)  Result Value Ref Range   RMSF IgG Negative Negative   RMSF IgM 0.73 0.00 - 0.89 index  Lyme Ab/Western Blot Reflex  Result Value Ref Range   Lyme IgG/IgM Ab <0.91 0.00 - 0.90 ISR   LYME DISEASE AB, QUANT, IGM <0.80 0.00 - 0.79 index  Vit D  25 hydroxy (rtn osteoporosis monitoring)  Result Value Ref Range   Vit D, 25-Hydroxy 30.0 30.0 - 100.0 ng/mL  Vitamin B12  Result Value Ref Range   Vitamin B-12 280 211 - 946 pg/mL  TSH  Result Value Ref Range   TSH 1.770 0.450 - 4.500 uIU/mL  Rheumatoid factor  Result Value Ref Range   Rhuematoid fact SerPl-aCnc <10.0 0.0 - 13.9 IU/mL  Sedimentation rate  Result Value Ref Range   Sed Rate 2 0 - 40 mm/hr  C-reactive protein  Result Value Ref Range   CRP 5.5 (H) 0.0 - 4.9 mg/L      Assessment & Plan:   Problem List Items Addressed This Visit      Digestive   GERD (gastroesophageal reflux disease) - Primary    Globus sensation in her throat, recommend to get EGD, will add Zantac to her omeprazole and give GI cocktail       Relevant Medications   ranitidine (ZANTAC) 150 MG tablet   Other Relevant Orders   GI COCKTAIL UP TO 45 CC       Follow up plan: Return if symptoms worsen or fail to improve.  Caryl Pina, MD [facility] 08/11/2015, 10:59 AM

## 2015-08-13 ENCOUNTER — Telehealth: Payer: Self-pay

## 2015-08-13 DIAGNOSIS — J358 Other chronic diseases of tonsils and adenoids: Secondary | ICD-10-CM

## 2015-08-13 NOTE — Telephone Encounter (Signed)
Patient aware referral has been placed.

## 2015-08-13 NOTE — Telephone Encounter (Signed)
Given that is fine, put it under the diagnosis of tonsils stones

## 2015-08-13 NOTE — Telephone Encounter (Signed)
Patient would like to have a referral to ENT along with the GI you had previously spoken about.

## 2015-08-15 ENCOUNTER — Other Ambulatory Visit: Payer: Self-pay | Admitting: Otolaryngology

## 2015-08-15 ENCOUNTER — Ambulatory Visit: Payer: 59 | Admitting: Gastroenterology

## 2015-08-15 DIAGNOSIS — R1310 Dysphagia, unspecified: Secondary | ICD-10-CM

## 2015-08-18 ENCOUNTER — Encounter: Payer: Self-pay | Admitting: *Deleted

## 2015-08-19 ENCOUNTER — Other Ambulatory Visit (INDEPENDENT_AMBULATORY_CARE_PROVIDER_SITE_OTHER): Payer: 59

## 2015-08-19 DIAGNOSIS — R350 Frequency of micturition: Secondary | ICD-10-CM

## 2015-08-19 LAB — POCT URINALYSIS DIPSTICK
BILIRUBIN UA: NEGATIVE
GLUCOSE UA: NEGATIVE
KETONES UA: NEGATIVE
LEUKOCYTES UA: NEGATIVE
NITRITE UA: NEGATIVE
PH UA: 6
Protein, UA: NEGATIVE
RBC UA: NEGATIVE
Spec Grav, UA: 1.01
UROBILINOGEN UA: NEGATIVE

## 2015-08-19 LAB — POCT UA - MICROSCOPIC ONLY
BACTERIA, U MICROSCOPIC: NEGATIVE
CASTS, UR, LPF, POC: NEGATIVE
CRYSTALS, UR, HPF, POC: NEGATIVE
MUCUS UA: NEGATIVE
RBC, URINE, MICROSCOPIC: NEGATIVE
WBC, Ur, HPF, POC: NEGATIVE
YEAST UA: NEGATIVE

## 2015-08-20 ENCOUNTER — Other Ambulatory Visit (INDEPENDENT_AMBULATORY_CARE_PROVIDER_SITE_OTHER): Payer: 59

## 2015-08-20 ENCOUNTER — Other Ambulatory Visit: Payer: Self-pay | Admitting: *Deleted

## 2015-08-20 DIAGNOSIS — N898 Other specified noninflammatory disorders of vagina: Secondary | ICD-10-CM

## 2015-08-20 LAB — POCT WET PREP WITH KOH
Clue Cells Wet Prep HPF POC: NEGATIVE
TRICHOMONAS UA: NEGATIVE
YEAST WET PREP PER HPF POC: NEGATIVE

## 2015-08-21 ENCOUNTER — Ambulatory Visit (HOSPITAL_COMMUNITY)
Admission: RE | Admit: 2015-08-21 | Discharge: 2015-08-21 | Disposition: A | Discharge status: Home / Self Care | Payer: 59 | Source: Ambulatory Visit | Attending: Otolaryngology | Admitting: Otolaryngology

## 2015-08-21 DIAGNOSIS — K224 Dyskinesia of esophagus: Secondary | ICD-10-CM | POA: Insufficient documentation

## 2015-08-21 DIAGNOSIS — R1310 Dysphagia, unspecified: Secondary | ICD-10-CM | POA: Insufficient documentation

## 2015-08-22 ENCOUNTER — Encounter: Payer: Self-pay | Admitting: Gastroenterology

## 2015-08-22 ENCOUNTER — Ambulatory Visit (INDEPENDENT_AMBULATORY_CARE_PROVIDER_SITE_OTHER): Payer: 59 | Admitting: Gastroenterology

## 2015-08-22 VITALS — BP 112/76 | HR 80 | Ht 67.0 in | Wt 240.2 lb

## 2015-08-22 DIAGNOSIS — K2100 Gastro-esophageal reflux disease with esophagitis, without bleeding: Secondary | ICD-10-CM

## 2015-08-22 DIAGNOSIS — F458 Other somatoform disorders: Secondary | ICD-10-CM

## 2015-08-22 DIAGNOSIS — R0989 Other specified symptoms and signs involving the circulatory and respiratory systems: Secondary | ICD-10-CM

## 2015-08-22 NOTE — Patient Instructions (Signed)
Continue Zantac. We have given you anti-reflux measures.   Follow up as needed.

## 2015-09-06 ENCOUNTER — Encounter: Payer: Self-pay | Admitting: Gastroenterology

## 2015-09-06 DIAGNOSIS — R0989 Other specified symptoms and signs involving the circulatory and respiratory systems: Secondary | ICD-10-CM | POA: Insufficient documentation

## 2015-09-06 DIAGNOSIS — K2100 Gastro-esophageal reflux disease with esophagitis, without bleeding: Secondary | ICD-10-CM | POA: Insufficient documentation

## 2015-09-06 NOTE — Progress Notes (Addendum)
08/22/2015 [name] [id number] June 27, 1961   HISTORY OF PRESENT ILLNESS:  This is a 54 year old female who is previous known to Dr. Sharlett Iles.  Going to follow with Dr. Hilarie Fredrickson in Dr. Buel Ream absence.  Her last colonoscopy was in 05/2012 at which time she was found to have a redundant colon, moderate diverticulosis, and two polyps that were hyperplastic and one with part tubular adenoma; recall entered for 5 years.    She had an EGD in 10/2006 where she had reflux esophagitis and a hiatal hernia.  She presents to our office today at the request of her PCP, Dr. Warrick Parisian, for evaluation regarding recent issues with sensation that there is something in her throat/esophagus and other reflux related issues.  Says that she had 3 instances where she woke up and was choking on acid.  Also was having what sounds like globus sensation.  She usually takes omeprazole 20 mg daily.  Zantac 150 mg BID was added to her regimen and she says that she's had great improvement in her symptoms.  Really not having any further symptoms at all.  She has also been trying to follow reflux dietary measures as well since she is a big soda drinker; she has not been drinking soda at all and is drinking mostly water.  She had an esophagram just yesterday, 11/17 that showed mild esophageal dysmotility, but was otherwise negative.  Past Medical History  Diagnosis Date  . Allergy     seasonal  . Diabetes mellitus     type ii  . GERD (gastroesophageal reflux disease)   . Hypertension   . Ulcer   . Obesity   . Hiatal hernia   . Tubular adenoma of colon 05/27/2015  . Anxiety   . Irritable bowel syndrome    Past Surgical History  Procedure Laterality Date  . Cholecystectomy  2009    reports that she has never smoked. She has never used smokeless tobacco. She reports that she drinks alcohol. She reports that she does not use illicit drugs. family history includes Colitis in her father, paternal grandfather, and  sister; Colon polyps in her father and sister; Crohn's disease in her sister; Diabetes in her maternal aunt, maternal grandmother, mother, paternal aunt, and paternal grandmother; Heart disease in her father and paternal grandmother; Irritable bowel syndrome in her father; Kidney disease in her mother; Other in an other family member. Allergies  Allergen Reactions  . Erythromycin Other (See Comments)    Abdominal pain  . Codeine Other (See Comments)    Hyper   . Sulfa Antibiotics Hives    Outpatient Encounter Prescriptions as of 08/22/2015  Medication Sig  . ALPRAZolam (XANAX) 0.5 MG tablet Take 1 tablet (0.5 mg total) by mouth at bedtime as needed for sleep.  Marland Kitchen aspirin EC 81 MG tablet Take 81 mg by mouth daily.  . furosemide (LASIX) 40 MG tablet Take 1 tablet (40 mg total) by mouth daily.  Marland Kitchen glucose blood (ONETOUCH VERIO) test strip Check fasting blood sugar 1X a day and prn    Dx 250.01  . metFORMIN (GLUCOPHAGE) 500 MG tablet Take 1 tablet (500 mg total) by mouth 2 (two) times daily.  Marland Kitchen omeprazole (PRILOSEC) 20 MG capsule Take 1 capsule (20 mg total) by mouth daily.  . ramipril (ALTACE) 10 MG capsule TAKE 2 CAPSULES BY MOUTH ONCE DAILY.  . ranitidine (ZANTAC) 150 MG tablet Take 1 tablet (150 mg total) by mouth 2 (two) times daily.  . [  DISCONTINUED] ciprofloxacin-dexamethasone (CIPRODEX) otic suspension Place 4 drops into both ears 2 (two) times daily.  . [DISCONTINUED] fluconazole (DIFLUCAN) 150 MG tablet Take 1 tablet (150 mg total) by mouth once.   No facility-administered encounter medications on file as of 08/22/2015.     REVIEW OF SYSTEMS  : All other systems reviewed and negative except where noted in the History of Present Illness.   PHYSICAL EXAM: BP 112/76 mmHg  Pulse 80  Ht 5\' 7"  (1.702 m)  Wt 240 lb 3.2 oz (108.954 kg)  BMI 37.61 kg/m2 General: Well developed female in no acute distress Head: Normocephalic and atraumatic Eyes:  Sclerae anicteric, conjunctiva  pink. Ears: Normal auditory acuity Lungs: Clear throughout to auscultation Heart: Regular rate and rhythm Abdomen: Soft, non-distended. Normal bowel sounds.  Non-tender. Musculoskeletal: Symmetrical with no gross deformities  Skin: No lesions on visible extremities Extremities: No edema  Neurological: Alert oriented x 4, grossly non-focal Psychological:  Alert and cooperative. Normal mood and affect  ASSESSMENT AND PLAN: -Globus sensation with intermittent nausea and vomiting related to reflux.  Negative esophagram other than esophageal dysmotility.  Symptoms much improved with addition of Zantac 150 mg BID to her usual omeprazole 20 mg daily as well as reflux dietary measures.  Will continue these for now.  Do not think that there is any need to perform EGD with negative esophagram.  Discussed anti-reflux measures again and she will continue to try to follow those.  Follow-up prn for return of or worsening symptoms.  CC:  Hassell Done, [name]-Margaret, *   Addendum: Reviewed and agree with management. Jerene Bears, MD

## 2015-09-09 ENCOUNTER — Other Ambulatory Visit: Payer: Self-pay

## 2015-09-09 DIAGNOSIS — K2100 Gastro-esophageal reflux disease with esophagitis, without bleeding: Secondary | ICD-10-CM

## 2015-09-09 MED ORDER — RANITIDINE HCL 150 MG PO TABS: 150.0000 mg | ORAL_TABLET | Freq: Two times a day (BID) | ORAL | Status: DC

## 2015-10-01 ENCOUNTER — Ambulatory Visit: Payer: 59 | Admitting: Internal Medicine

## 2015-10-23 ENCOUNTER — Other Ambulatory Visit: Payer: Self-pay | Admitting: *Deleted

## 2015-10-23 MED ORDER — FUROSEMIDE 40 MG PO TABS: 40.0000 mg | ORAL_TABLET | Freq: Every day | ORAL | Status: DC

## 2015-10-23 MED ORDER — RAMIPRIL 10 MG PO CAPS: ORAL_CAPSULE | ORAL | Status: DC

## 2015-10-23 MED ORDER — METFORMIN HCL 500 MG PO TABS: 500.0000 mg | ORAL_TABLET | Freq: Two times a day (BID) | ORAL | Status: DC

## 2015-10-23 MED FILL — RAMIPRIL 10 MG CAPSULE: 10 | 90 days supply | Qty: 180 | Fill #0

## 2015-10-23 MED FILL — FUROSEMIDE 40 MG TABLET: 40 | 90 days supply | Qty: 90 | Fill #0

## 2015-10-23 MED FILL — metFORMIN HCL 500 MG TABS: 500 | 90 days supply | Qty: 180 | Fill #0

## 2015-10-29 ENCOUNTER — Other Ambulatory Visit: Payer: Self-pay

## 2015-10-29 DIAGNOSIS — E119 Type 2 diabetes mellitus without complications: Secondary | ICD-10-CM

## 2015-10-29 DIAGNOSIS — I10 Essential (primary) hypertension: Secondary | ICD-10-CM

## 2015-10-29 DIAGNOSIS — E785 Hyperlipidemia, unspecified: Secondary | ICD-10-CM

## 2015-11-03 ENCOUNTER — Encounter: Payer: Self-pay | Admitting: Pediatrics

## 2015-11-03 ENCOUNTER — Ambulatory Visit (INDEPENDENT_AMBULATORY_CARE_PROVIDER_SITE_OTHER): Payer: 59 | Admitting: Pediatrics

## 2015-11-03 VITALS — BP 145/92 | HR 106 | Temp 97.7°F | Ht 67.0 in | Wt 239.2 lb

## 2015-11-03 DIAGNOSIS — R6889 Other general symptoms and signs: Secondary | ICD-10-CM | POA: Diagnosis not present

## 2015-11-03 DIAGNOSIS — J029 Acute pharyngitis, unspecified: Secondary | ICD-10-CM | POA: Diagnosis not present

## 2015-11-03 LAB — POCT INFLUENZA A/B
Influenza A, POC: NEGATIVE
Influenza B, POC: NEGATIVE

## 2015-11-03 LAB — POCT RAPID STREP A (OFFICE): Rapid Strep A Screen: NEGATIVE

## 2015-11-03 NOTE — Addendum Note (Signed)
Addended by: Eustaquio Maize on: 11/03/2015 08:38 AM   Modules accepted: Miquel Dunn

## 2015-11-03 NOTE — Patient Instructions (Signed)
Netipot with distilled water 2-3 times a day to clear out sinuses Or Normal saline nasal spray Flonase steroid nasal spray Ibuprofen 600-800mg  three times a day Lots of fluids

## 2015-11-03 NOTE — Addendum Note (Signed)
Addended by: Selmer Dominion on: 11/03/2015 11:49 AM   Modules accepted: Orders

## 2015-11-03 NOTE — Progress Notes (Signed)
Subjective:    Patient ID: [name], female    DOB: Apr 19, 1961, 55 y.o.   MRN: [id number]  CC: Sore Throat; Generalized Body Aches; Chest congestion; and Fever   HPI: [name] is a 55 y.o. female presenting for Sore Throat; Generalized Body Aches; Chest congestion; and Fever  Two days ago started having sore throat Hurt all over Subjective fevers, chills, feeling hot and cold Was freezing cold all day yesterday Coughing some Lots of nasal discharge    Depression screen [facility] 2/9 11/03/2015 08/11/2015 08/04/2015 04/02/2015 04/29/2014  Decreased Interest 0 0 0 0 0  Down, Depressed, Hopeless 0 0 0 0 0  PHQ - 2 Score 0 0 0 0 0     Relevant past medical, surgical, family and social history reviewed and updated as indicated. Interim medical history since our last visit reviewed. Allergies and medications reviewed and updated.    ROS: Per HPI unless specifically indicated above  History  Smoking status  . Never Smoker   Smokeless tobacco  . Never Used    Comment: AS A Teenager    Past Medical History Patient Active Problem List   Diagnosis Date Noted  . Globus sensation 09/06/2015  . Gastroesophageal reflux disease with esophagitis 09/06/2015  . Type II or unspecified type diabetes mellitus without mention of complication, not stated as uncontrolled 04/29/2014  . Hyperlipidemia with target LDL less than 100 10/23/2007  . OBESITY 10/23/2007  . HYPERTENSION 10/23/2007  . IBS (irritable bowel syndrome) 10/23/2007  . GERD (gastroesophageal reflux disease) 10/07/2006  . HIATAL HERNIA 10/07/2006  . POLYP, COLON 09/14/2006    Current Outpatient Prescriptions  Medication Sig Dispense Refill  . ALPRAZolam (XANAX) 0.5 MG tablet Take 1 tablet (0.5 mg total) by mouth at bedtime as needed for sleep. 60 tablet 0  . aspirin EC 81 MG tablet Take 81 mg by mouth daily.    . furosemide (LASIX) 40 MG tablet Take 1 tablet (40 mg total) by mouth daily. 90 tablet 1  . glucose  blood (ONETOUCH VERIO) test strip Check fasting blood sugar 1X a day and prn    Dx 250.01 300 each 1  . metFORMIN (GLUCOPHAGE) 500 MG tablet Take 1 tablet (500 mg total) by mouth 2 (two) times daily. Do not send to [facility] 180 tablet 1  . omeprazole (PRILOSEC) 20 MG capsule Take 1 capsule (20 mg total) by mouth daily. 90 capsule 1  . ramipril (ALTACE) 10 MG capsule TAKE 2 CAPSULES BY MOUTH ONCE DAILY. 180 capsule 1  . ranitidine (ZANTAC) 150 MG tablet Take 1 tablet (150 mg total) by mouth 2 (two) times daily. 180 tablet 1   No current facility-administered medications for this visit.       Objective:    BP 145/92 mmHg  Pulse 106  Temp(Src) 97.7 F (36.5 C) (Oral)  Ht 5\' 7"  (1.702 m)  Wt 239 lb 3.2 oz (108.5 kg)  BMI 37.46 kg/m2  Wt Readings from Last 3 Encounters:  11/03/15 239 lb 3.2 oz (108.5 kg)  08/22/15 240 lb 3.2 oz (108.954 kg)  08/11/15 242 lb 9.6 oz (110.043 kg)     Gen: NAD, alert, cooperative with exam, NCAT, congested, hoarse EYES: EOMI, no scleral injection or icterus ENT:  TMs pearly gray with central erythema, retracted b/l, OP with mild erythema, no exudates on tonsils LYMPH: no cervical LAD CV: NRRR, normal S1/S2, no murmur, distal pulses 2+ b/l Resp: CTABL, no wheezes, normal WOB Ext: No  edema, warm Neuro: Alert and oriented, coordination grossly normal MSK: normal muscle bulk     Assessment & Plan:    Murrell was seen today for sore throat, generalized body aches, chest congestion and fever, due to flu-like illness and acute URI. Lung exam normal. No indications for antibiotics at this time. Will let me know if worsening. Discussed symptomatic care. Has had bad reaction to tamiflu in the past.  Diagnoses and all orders for this visit:  Flu-like symptoms -     POCT Influenza A/B  Sore throat -     POCT rapid strep A   Follow up plan: Return if symptoms worsen or fail to improve.  Assunta Found, MD [facility] 11/03/2015,  8:29 AM

## 2015-11-04 ENCOUNTER — Telehealth: Payer: Self-pay | Admitting: *Deleted

## 2015-11-04 ENCOUNTER — Other Ambulatory Visit: Payer: Self-pay | Admitting: Nurse Practitioner

## 2015-11-04 MED ORDER — AZITHROMYCIN 250 MG PO TABS: ORAL_TABLET | ORAL | Status: DC

## 2015-11-04 MED ORDER — LEVOFLOXACIN 500 MG PO TABS: 500.0000 mg | ORAL_TABLET | Freq: Every day | ORAL | Status: DC

## 2015-11-04 NOTE — Telephone Encounter (Signed)
Patient called stating that she is not feeling any better.  Seen by Dr. Evette Doffing yesterday 01/31. Patient would like an antibiotic sent to [facility]

## 2015-11-05 LAB — CULTURE, GROUP A STREP: Strep A Culture: NEGATIVE

## 2015-11-07 ENCOUNTER — Other Ambulatory Visit (INDEPENDENT_AMBULATORY_CARE_PROVIDER_SITE_OTHER): Payer: 59 | Admitting: *Deleted

## 2015-11-07 DIAGNOSIS — J069 Acute upper respiratory infection, unspecified: Secondary | ICD-10-CM | POA: Diagnosis not present

## 2015-11-07 MED ORDER — METHYLPREDNISOLONE ACETATE 80 MG/ML IJ SUSP
80.0000 mg | Freq: Once | INTRAMUSCULAR | Status: AC
  Administered 2015-11-07: 80 mg via INTRAMUSCULAR

## 2015-12-02 ENCOUNTER — Encounter: Payer: 59 | Admitting: *Deleted

## 2015-12-02 DIAGNOSIS — Z1231 Encounter for screening mammogram for malignant neoplasm of breast: Secondary | ICD-10-CM | POA: Diagnosis not present

## 2015-12-02 LAB — HM MAMMOGRAPHY

## 2015-12-22 ENCOUNTER — Encounter: Payer: Self-pay | Admitting: *Deleted

## 2016-01-12 MED FILL — raNITIdine HCL 150 MG TABS: 150 | 90 days supply | Qty: 180 | Fill #1

## 2016-01-21 ENCOUNTER — Ambulatory Visit (INDEPENDENT_AMBULATORY_CARE_PROVIDER_SITE_OTHER): Payer: 59 | Admitting: Family Medicine

## 2016-01-21 ENCOUNTER — Encounter: Payer: Self-pay | Admitting: Family Medicine

## 2016-01-21 VITALS — BP 141/77 | HR 94 | Temp 97.1°F | Ht 67.0 in | Wt 237.4 lb

## 2016-01-21 DIAGNOSIS — M62830 Muscle spasm of back: Secondary | ICD-10-CM

## 2016-01-21 DIAGNOSIS — R300 Dysuria: Secondary | ICD-10-CM

## 2016-01-21 LAB — URINALYSIS, COMPLETE
Bilirubin, UA: NEGATIVE
GLUCOSE, UA: NEGATIVE
KETONES UA: NEGATIVE
LEUKOCYTES UA: NEGATIVE
Nitrite, UA: NEGATIVE
Protein, UA: NEGATIVE
RBC, UA: NEGATIVE
SPEC GRAV UA: 1.01 (ref 1.005–1.030)
Urobilinogen, Ur: 0.2 mg/dL (ref 0.2–1.0)
pH, UA: 7 (ref 5.0–7.5)

## 2016-01-21 LAB — MICROSCOPIC EXAMINATION
Bacteria, UA: NONE SEEN
RBC, UA: NONE SEEN /hpf (ref 0–?)

## 2016-01-21 NOTE — Progress Notes (Signed)
BP 141/77 mmHg  Pulse 94  Temp(Src) 97.1 F (36.2 C) (Oral)  Ht 5\' 7"  (1.702 m)  Wt 237 lb 6.4 oz (107.684 kg)  BMI 37.17 kg/m2   Subjective:    Patient ID: [name], female    DOB: Oct 06, 1960, 55 y.o.   MRN: [id number]  HPI: [name] is a 55 y.o. female presenting on 01/21/2016 for Urinary Tract Infection   HPI Left flank pain Patient comes in today because of left flank pain that she has been having for the past couple days. She has had pyelonephritis previously it was concerned about whether this could be the start of that. She denies any fevers or chills or hematuria or urinary frequency. She denies any actual dysuria or change in color or urine. She has increased her fluid intake over the past few days to try and combat possibility of urinary tract infection. She has used Tylenol is helped some. Sometimes pain radiates around to the left side of her upper abdomen. She has been having intermittent constipation as well.  Relevant past medical, surgical, family and social history reviewed and updated as indicated. Interim medical history since our last visit reviewed. Allergies and medications reviewed and updated.  Review of Systems  Constitutional: Negative for fever and chills.  HENT: Negative for congestion, ear discharge and ear pain.   Eyes: Negative for redness and visual disturbance.  Respiratory: Negative for chest tightness and shortness of breath.   Cardiovascular: Negative for chest pain and leg swelling.  Gastrointestinal: Positive for abdominal pain.  Genitourinary: Positive for flank pain. Negative for dysuria, urgency, frequency, vaginal discharge, difficulty urinating and pelvic pain.  Musculoskeletal: Negative for back pain and gait problem.  Skin: Negative for rash.  Neurological: Negative for dizziness, light-headedness and headaches.  Psychiatric/Behavioral: Negative for behavioral problems and agitation.  All other systems reviewed and are  negative.   Per HPI unless specifically indicated above     Medication List       This list is accurate as of: 01/21/16  2:15 PM.  Always use your most recent med list.               ALPRAZolam 0.5 MG tablet  Commonly known as:  XANAX  Take 1 tablet (0.5 mg total) by mouth at bedtime as needed for sleep.     aspirin EC 81 MG tablet  Take 81 mg by mouth daily.     furosemide 40 MG tablet  Commonly known as:  LASIX  Take 1 tablet (40 mg total) by mouth daily.     glucose blood test strip  Commonly known as:  ONETOUCH VERIO  Check fasting blood sugar 1X a day and prn    Dx 250.01     metFORMIN 500 MG tablet  Commonly known as:  GLUCOPHAGE  Take 1 tablet (500 mg total) by mouth 2 (two) times daily. [facility] not [facility] to [facility]     omeprazole 20 MG capsule  Commonly known as:  PRILOSEC  Take 1 capsule (20 mg total) by mouth daily.     ramipril 10 MG capsule  Commonly known as:  ALTACE  TAKE 2 CAPSULES BY MOUTH ONCE DAILY.     ranitidine 150 MG tablet  Commonly known as:  ZANTAC  Take 1 tablet (150 mg total) by mouth 2 (two) times daily.           Objective:    BP 141/77 mmHg  Pulse 94  Temp(Src) 97.1  F (36.2 C) (Oral)  Ht 5\' 7"  (1.702 m)  Wt 237 lb 6.4 oz (107.684 kg)  BMI 37.17 kg/m2  Wt Readings from Last 3 Encounters:  01/21/16 237 lb 6.4 oz (107.684 kg)  11/03/15 239 lb 3.2 oz (108.5 kg)  08/22/15 240 lb 3.2 oz (108.954 kg)    Physical Exam  Constitutional: She is oriented to person, place, and time. She appears well-developed and well-nourished. No distress.  Eyes: Conjunctivae and EOM are normal. Pupils are equal, round, and reactive to light.  Neck: Neck supple. No thyromegaly present.  Cardiovascular: Normal rate, regular rhythm, normal heart sounds and intact distal pulses.   No murmur heard. Pulmonary/Chest: Effort normal and breath sounds normal. No respiratory distress. She has no wheezes.  Abdominal: Soft. Bowel sounds are normal. She  exhibits no distension. There is no tenderness. There is no rebound, no guarding and no CVA tenderness (Does not initially fit with CVA tenderness but is in the region of the left lower back, concern for po possible spasms ).  Musculoskeletal: Normal range of motion. She exhibits no edema or tenderness.       Lumbar back: She exhibits normal range of motion, no tenderness (No pain on exam and negative straight leg raise), no bony tenderness and no swelling.  Lymphadenopathy:    She has no cervical adenopathy.  Neurological: She is alert and oriented to person, place, and time. Coordination normal.  Skin: Skin is warm and dry. No rash noted. She is not diaphoretic.  Psychiatric: She has a normal mood and affect. Her behavior is normal.  Nursing note and vitals reviewed.     Assessment & Plan:   Problem List Items Addressed This Visit    None    Visit Diagnoses    Dysuria    -  Primary    Urine was clean with no signs of infection at all, pain is left flank and back    Relevant Orders    Urinalysis, Complete    Muscle spasm of back        Recommend using a tennis ball and if not improved then to give [place] a call back.       Follow up plan: Return in about 4 weeks (around 02/18/2016), or if symptoms worsen or fail to improve, for Diabetes recheck.  Counseling provided for all of the vaccine components Orders Placed This Encounter  Procedures  . Urinalysis, Complete    Caryl Pina, MD [facility] 01/21/2016, 2:15 PM

## 2016-01-22 ENCOUNTER — Other Ambulatory Visit: Payer: Self-pay

## 2016-01-22 ENCOUNTER — Other Ambulatory Visit: Payer: Self-pay | Admitting: Family Medicine

## 2016-01-22 ENCOUNTER — Ambulatory Visit: Payer: 59 | Admitting: Family Medicine

## 2016-01-22 ENCOUNTER — Other Ambulatory Visit: Payer: 59

## 2016-01-22 DIAGNOSIS — E785 Hyperlipidemia, unspecified: Secondary | ICD-10-CM

## 2016-01-22 DIAGNOSIS — E119 Type 2 diabetes mellitus without complications: Secondary | ICD-10-CM

## 2016-01-22 DIAGNOSIS — I10 Essential (primary) hypertension: Secondary | ICD-10-CM | POA: Diagnosis not present

## 2016-01-22 DIAGNOSIS — K219 Gastro-esophageal reflux disease without esophagitis: Secondary | ICD-10-CM

## 2016-01-22 LAB — BAYER DCA HB A1C WAIVED: HB A1C: 6.3 % (ref <7.0)

## 2016-01-23 LAB — CMP14+EGFR
ALBUMIN: 4.4 g/dL (ref 3.5–5.5)
ALK PHOS: 66 IU/L (ref 39–117)
ALT: 19 IU/L (ref 0–32)
AST: 13 IU/L (ref 0–40)
Albumin/Globulin Ratio: 1.8 (ref 1.2–2.2)
BUN / CREAT RATIO: 10 (ref 9–23)
BUN: 8 mg/dL (ref 6–24)
Bilirubin Total: 0.4 mg/dL (ref 0.0–1.2)
CHLORIDE: 96 mmol/L (ref 96–106)
CO2: 27 mmol/L (ref 18–29)
CREATININE: 0.79 mg/dL (ref 0.57–1.00)
Calcium: 9.7 mg/dL (ref 8.7–10.2)
GFR calc Af Amer: 99 mL/min/{1.73_m2} (ref >59)
GFR calc non Af Amer: 86 mL/min/{1.73_m2} (ref >59)
GLUCOSE: 121 mg/dL — AB (ref 65–99)
Globulin, Total: 2.4 g/dL (ref 1.5–4.5)
Potassium: 4.1 mmol/L (ref 3.5–5.2)
Sodium: 138 mmol/L (ref 134–144)
Total Protein: 6.8 g/dL (ref 6.0–8.5)

## 2016-01-23 LAB — THYROID PANEL WITH TSH
Free Thyroxine Index: 2.1 (ref 1.2–4.9)
T3 UPTAKE RATIO: 28 % (ref 24–39)
T4, Total: 7.6 ug/dL (ref 4.5–12.0)
TSH: 1.71 u[IU]/mL (ref 0.450–4.500)

## 2016-01-23 LAB — LIPID PANEL
CHOLESTEROL TOTAL: 202 mg/dL — AB (ref 100–199)
Chol/HDL Ratio: 4.1 ratio units (ref 0.0–4.4)
HDL: 49 mg/dL (ref >39)
LDL CALC: 127 mg/dL — AB (ref 0–99)
TRIGLYCERIDES: 128 mg/dL (ref 0–149)
VLDL CHOLESTEROL CAL: 26 mg/dL (ref 5–40)

## 2016-01-23 MED FILL — RAMIPRIL 10 MG CAPSULE: 10 | 90 days supply | Qty: 180 | Fill #1

## 2016-01-23 MED FILL — metFORMIN HCL 500 MG TABS: 500 | 90 days supply | Qty: 180 | Fill #1

## 2016-01-23 MED FILL — FUROSEMIDE 40 MG TABLET: 40 | 90 days supply | Qty: 90 | Fill #1

## 2016-01-28 ENCOUNTER — Ambulatory Visit (INDEPENDENT_AMBULATORY_CARE_PROVIDER_SITE_OTHER): Payer: 59 | Admitting: Family Medicine

## 2016-01-28 ENCOUNTER — Encounter: Payer: Self-pay | Admitting: Family Medicine

## 2016-01-28 VITALS — BP 108/70 | HR 93 | Temp 98.2°F | Ht 67.0 in | Wt 241.6 lb

## 2016-01-28 DIAGNOSIS — E1169 Type 2 diabetes mellitus with other specified complication: Secondary | ICD-10-CM | POA: Insufficient documentation

## 2016-01-28 DIAGNOSIS — E785 Hyperlipidemia, unspecified: Secondary | ICD-10-CM | POA: Diagnosis not present

## 2016-01-28 DIAGNOSIS — E119 Type 2 diabetes mellitus without complications: Secondary | ICD-10-CM | POA: Diagnosis not present

## 2016-01-28 NOTE — Progress Notes (Signed)
BP 108/70 mmHg  Pulse 93  Temp(Src) 98.2 F (36.8 C) (Oral)  Ht '5\' 7"'  (1.702 m)  Wt 241 lb 9.6 oz (109.589 kg)  BMI 37.83 kg/m2   Subjective:    Patient ID: [name], female    DOB: [birth year], 55 y.o.   MRN: [id number]  HPI: [name] is a 55 y.o. female presenting on 01/28/2016 for Diabetes   HPI Type 2 diabetes Patient is coming in for recheck of her type 2 diabetes. Patient is currently on metformin 500 and is trying to do diet and exercise. She denies any issues with her feet. She is due for ophthalmology exam and will go to get one soon. Her blood sugars have been running a lot better especially now that she has stopped using soda as much. Patient denies headaches, blurred vision, chest pains, shortness of breath, or weakness. Denies any side effects from medication and is content with current medication.   Relevant past medical, surgical, family and social history reviewed and updated as indicated. Interim medical history since our last visit reviewed. Allergies and medications reviewed and updated.  Review of Systems  Constitutional: Negative for fever and chills.  HENT: Negative for congestion, ear discharge and ear pain.   Eyes: Negative for redness and visual disturbance.  Respiratory: Negative for cough, chest tightness and shortness of breath.   Cardiovascular: Negative for chest pain, palpitations and leg swelling.  Genitourinary: Negative for dysuria and difficulty urinating.  Musculoskeletal: Negative for back pain and gait problem.  Skin: Negative for rash.  Neurological: Negative for dizziness, light-headedness and headaches.  Psychiatric/Behavioral: Negative for behavioral problems and agitation.  All other systems reviewed and are negative.   Per HPI unless specifically indicated above     Medication List       This list is accurate as of: 01/28/16  4:43 PM.  Always use your most recent med list.               ALPRAZolam 0.5 MG tablet    Commonly known as:  XANAX  Take 1 tablet (0.5 mg total) by mouth at bedtime as needed for sleep.     aspirin EC 81 MG tablet  Take 81 mg by mouth daily.     furosemide 40 MG tablet  Commonly known as:  LASIX  Take 1 tablet (40 mg total) by mouth daily.     glucose blood test strip  Commonly known as:  ONETOUCH VERIO  Check fasting blood sugar 1X a day and prn    Dx 250.01     metFORMIN 500 MG tablet  Commonly known as:  GLUCOPHAGE  Take 1 tablet (500 mg total) by mouth 2 (two) times daily. Do not send to [facility]     omeprazole 20 MG capsule  Commonly known as:  PRILOSEC  Take 1 capsule (20 mg total) by mouth daily.     ramipril 10 MG capsule  Commonly known as:  ALTACE  TAKE 2 CAPSULES BY MOUTH ONCE DAILY.     ranitidine 150 MG tablet  Commonly known as:  ZANTAC  Take 1 tablet (150 mg total) by mouth 2 (two) times daily.           Objective:    BP 108/70 mmHg  Pulse 93  Temp(Src) 98.2 F (36.8 C) (Oral)  Ht '5\' 7"'  (1.702 m)  Wt 241 lb 9.6 oz (109.589 kg)  BMI 37.83 kg/m2  Wt Readings from Last 3 Encounters:  01/28/16  241 lb 9.6 oz (109.589 kg)  01/21/16 237 lb 6.4 oz (107.684 kg)  11/03/15 239 lb 3.2 oz (108.5 kg)    Physical Exam  Constitutional: She is oriented to person, place, and time. She appears well-developed and well-nourished. No distress.  Eyes: Conjunctivae and EOM are normal. Pupils are equal, round, and reactive to light.  Neck: Neck supple. No thyromegaly present.  Cardiovascular: Normal rate, regular rhythm, normal heart sounds and intact distal pulses.   No murmur heard. Pulmonary/Chest: Effort normal and breath sounds normal. No respiratory distress. She has no wheezes.  Musculoskeletal: Normal range of motion. She exhibits no edema or tenderness.  Lymphadenopathy:    She has no cervical adenopathy.  Neurological: She is alert and oriented to person, place, and time. Coordination normal.  Skin: Skin is warm and dry. No rash noted. She  is not diaphoretic.  Psychiatric: She has a normal mood and affect. Her behavior is normal.  Nursing note and vitals reviewed.  Diabetic Foot Exam - Simple   Simple Foot Form  Diabetic Foot exam was performed with the following findings:  Yes 01/28/2016  4:19 PM  Visual Inspection  No deformities, no ulcerations, no other skin breakdown bilaterally:  Yes  Sensation Testing  Intact to touch and monofilament testing bilaterally:  Yes  Pulse Check  Posterior Tibialis and Dorsalis pulse intact bilaterally:  Yes  Comments      Results for orders placed or performed in visit on 01/22/16  CMP14+EGFR  Result Value Ref Range   Glucose 121 (H) 65 - 99 mg/dL   BUN 8 6 - 24 mg/dL   Creatinine, Ser 0.79 0.57 - 1.00 mg/dL   GFR calc non Af Amer 86 >59 mL/min/1.73   GFR calc Af Amer 99 >59 mL/min/1.73   BUN/Creatinine Ratio 10 9 - 23   Sodium 138 134 - 144 mmol/L   Potassium 4.1 3.5 - 5.2 mmol/L   Chloride 96 96 - 106 mmol/L   CO2 27 18 - 29 mmol/L   Calcium 9.7 8.7 - 10.2 mg/dL   Total Protein 6.8 6.0 - 8.5 g/dL   Albumin 4.4 3.5 - 5.5 g/dL   Globulin, Total 2.4 1.5 - 4.5 g/dL   Albumin/Globulin Ratio 1.8 1.2 - 2.2   Bilirubin Total 0.4 0.0 - 1.2 mg/dL   Alkaline Phosphatase 66 39 - 117 IU/L   AST 13 0 - 40 IU/L   ALT 19 0 - 32 IU/L  Lipid panel  Result Value Ref Range   Cholesterol, Total 202 (H) 100 - 199 mg/dL   Triglycerides 128 0 - 149 mg/dL   HDL 49 >39 mg/dL   VLDL Cholesterol Cal 26 5 - 40 mg/dL   LDL Calculated 127 (H) 0 - 99 mg/dL   Chol/HDL Ratio 4.1 0.0 - 4.4 ratio units  Bayer DCA Hb A1c Waived  Result Value Ref Range   Bayer DCA Hb A1c Waived 6.3 <7.0 %  Thyroid Panel With TSH  Result Value Ref Range   TSH 1.710 0.450 - 4.500 uIU/mL   T4, Total 7.6 4.5 - 12.0 ug/dL   T3 Uptake Ratio 28 24 - 39 %   Free Thyroxine Index 2.1 1.2 - 4.9      Assessment & Plan:   Problem List Items Addressed This Visit      Endocrine   Type 2 diabetes mellitus, controlled ([place])  - Primary   Relevant Orders   Microalbumin / creatinine urine ratio   Type 2 diabetes mellitus  with hyperlipidemia ([place])   Relevant Orders   Microalbumin / creatinine urine ratio       Follow up plan: Return in about 6 months (around 07/29/2016), or if symptoms worsen or fail to improve, for Diabetes recheck.  Counseling provided for all of the vaccine components Orders Placed This Encounter  Procedures  . Microalbumin / creatinine urine ratio    Caryl Pina, MD [facility] 01/28/2016, 4:43 PM

## 2016-01-29 ENCOUNTER — Encounter: Payer: Self-pay | Admitting: Family Medicine

## 2016-01-30 ENCOUNTER — Other Ambulatory Visit: Payer: 59

## 2016-01-30 DIAGNOSIS — E1169 Type 2 diabetes mellitus with other specified complication: Secondary | ICD-10-CM | POA: Diagnosis not present

## 2016-01-30 DIAGNOSIS — E119 Type 2 diabetes mellitus without complications: Secondary | ICD-10-CM

## 2016-01-30 DIAGNOSIS — E785 Hyperlipidemia, unspecified: Secondary | ICD-10-CM | POA: Diagnosis not present

## 2016-01-31 LAB — MICROALBUMIN / CREATININE URINE RATIO
Creatinine, Urine: 21.2 mg/dL
Microalbumin, Urine: <3 ug/mL

## 2016-02-14 ENCOUNTER — Ambulatory Visit (INDEPENDENT_AMBULATORY_CARE_PROVIDER_SITE_OTHER): Payer: 59 | Admitting: Family Medicine

## 2016-02-14 ENCOUNTER — Encounter: Payer: Self-pay | Admitting: Family Medicine

## 2016-02-14 VITALS — BP 123/78 | HR 84 | Temp 97.3°F | Ht 67.0 in | Wt 235.0 lb

## 2016-02-14 DIAGNOSIS — L0291 Cutaneous abscess, unspecified: Secondary | ICD-10-CM | POA: Diagnosis not present

## 2016-02-14 DIAGNOSIS — L0390 Cellulitis, unspecified: Secondary | ICD-10-CM

## 2016-02-14 MED ORDER — DOXYCYCLINE HYCLATE 100 MG PO TABS: 100.0000 mg | ORAL_TABLET | Freq: Two times a day (BID) | ORAL | Status: DC

## 2016-02-14 NOTE — Progress Notes (Signed)
BP 123/78 mmHg  Pulse 84  Temp(Src) 97.3 F (36.3 C) (Oral)  Ht 5\' 7"  (1.702 m)  Wt 235 lb (106.595 kg)  BMI 36.80 kg/m2   Subjective:    Patient ID: [name], female    DOB: March 06, 1961, 55 y.o.   MRN: [id number]  HPI: [name] is a 55 y.o. female presenting on 02/14/2016 for Abscess   HPI Right lower abdominal wall abscess Patient has right lower abdominal wall abscess that has been developing over the past few days. She denies any fevers or chills but has noticed that the lump and area of redness has been getting larger in the area. It is very tender and tight in the region as well. She denies any rashes anywhere else. She says her blood sugar have been in the 100-200 range. She has not had any drainage out of it just yet.  Relevant past medical, surgical, family and social history reviewed and updated as indicated. Interim medical history since our last visit reviewed. Allergies and medications reviewed and updated.  Review of Systems  Constitutional: Negative for fever and chills.  HENT: Negative for congestion, ear discharge and ear pain.   Eyes: Negative for redness and visual disturbance.  Respiratory: Negative for chest tightness and shortness of breath.   Cardiovascular: Negative for chest pain and leg swelling.  Genitourinary: Negative for dysuria and difficulty urinating.  Musculoskeletal: Negative for back pain and gait problem.  Skin: Positive for color change and rash.  Neurological: Negative for light-headedness and headaches.  Psychiatric/Behavioral: Negative for behavioral problems and agitation.  All other systems reviewed and are negative.   Per HPI unless specifically indicated above     Medication List       This list is accurate as of: 02/14/16 10:10 AM.  Always use your most recent med list.               ALPRAZolam 0.5 MG tablet  Commonly known as:  XANAX  Take 1 tablet (0.5 mg total) by mouth at bedtime as needed for sleep.     aspirin EC 81 MG tablet  Take 81 mg by mouth daily.     doxycycline 100 MG tablet  Commonly known as:  VIBRA-TABS  Take 1 tablet (100 mg total) by mouth 2 (two) times daily. 1 po bid     furosemide 40 MG tablet  Commonly known as:  LASIX  Take 1 tablet (40 mg total) by mouth daily.     glucose blood test strip  Commonly known as:  ONETOUCH VERIO  Check fasting blood sugar 1X a day and prn    Dx 250.01     metFORMIN 500 MG tablet  Commonly known as:  GLUCOPHAGE  Take 1 tablet (500 mg total) by mouth 2 (two) times daily. Do not send to [facility]     omeprazole 20 MG capsule  Commonly known as:  PRILOSEC  Take 1 capsule (20 mg total) by mouth daily.     ramipril 10 MG capsule  Commonly known as:  ALTACE  TAKE 2 CAPSULES BY MOUTH ONCE DAILY.     ranitidine 150 MG tablet  Commonly known as:  ZANTAC  Take 1 tablet (150 mg total) by mouth 2 (two) times daily.           Objective:    BP 123/78 mmHg  Pulse 84  Temp(Src) 97.3 F (36.3 C) (Oral)  Ht 5\' 7"  (1.702 m)  Wt 235 lb (106.595  kg)  BMI 36.80 kg/m2  Wt Readings from Last 3 Encounters:  02/14/16 235 lb (106.595 kg)  01/28/16 241 lb 9.6 oz (109.589 kg)  01/21/16 237 lb 6.4 oz (107.684 kg)    Physical Exam  Constitutional: She is oriented to person, place, and time. She appears well-developed and well-nourished. No distress.  Eyes: Conjunctivae and EOM are normal. Pupils are equal, round, and reactive to light.  Cardiovascular: Normal rate, regular rhythm, normal heart sounds and intact distal pulses.   No murmur heard. Pulmonary/Chest: Effort normal and breath sounds normal. No respiratory distress. She has no wheezes.  Musculoskeletal: Normal range of motion. She exhibits no edema or tenderness.  Neurological: She is alert and oriented to person, place, and time. Coordination normal.  Skin: Skin is warm and dry. Lesion (A small papules on top of an area of erythema about 2 x 3 cm in size. An area of induration  underneath is also about 2-3 cm in size on her right lower abdomen.) noted. No rash noted. She is not diaphoretic.  Psychiatric: She has a normal mood and affect. Her behavior is normal.  Nursing note and vitals reviewed.   I&D: . Incision was made on Central aspect of the open wound. Significant serosanguineous drainage was exuded.  Pressure dressing was placed over top. Bleeding was minimal and patient tolerated procedure well. Unable to express much of purulent drainage, possibly not performed in 2 abscess yet.      Assessment & Plan:   Problem List Items Addressed This Visit    None    Visit Diagnoses    Cellulitis and abscess    -  Primary    Relevant Medications    doxycycline (VIBRA-TABS) 100 MG tablet        Follow up plan: Return if symptoms worsen or fail to improve.  Counseling provided for all of the vaccine components No orders of the defined types were placed in this encounter.    Caryl Pina, MD [facility] 02/14/2016, 10:10 AM

## 2016-03-02 ENCOUNTER — Other Ambulatory Visit: Payer: Self-pay

## 2016-03-02 MED FILL — ALPRAZolam 0.5 MG TABS: 0.5 | 5 days supply | Qty: 5 | Fill #0

## 2016-03-02 NOTE — Telephone Encounter (Signed)
Patient has an upcoming vacation scheduled and will be flying.  She would like something to help her with the anxiety of flying.  Per Dr. Warrick Parisian, okay to prescribe Xanax 0.5 mg, #5, as needed 30 minutes before flying.  Prescription called in to [facility].

## 2016-03-25 ENCOUNTER — Other Ambulatory Visit: Payer: Self-pay | Admitting: Family

## 2016-03-25 ENCOUNTER — Other Ambulatory Visit: Payer: Self-pay | Admitting: *Deleted

## 2016-03-25 ENCOUNTER — Other Ambulatory Visit: Payer: 59

## 2016-03-25 DIAGNOSIS — N898 Other specified noninflammatory disorders of vagina: Secondary | ICD-10-CM

## 2016-03-25 DIAGNOSIS — B9689 Other specified bacterial agents as the cause of diseases classified elsewhere: Secondary | ICD-10-CM

## 2016-03-25 DIAGNOSIS — L298 Other pruritus: Secondary | ICD-10-CM | POA: Diagnosis not present

## 2016-03-25 DIAGNOSIS — N760 Acute vaginitis: Principal | ICD-10-CM

## 2016-03-25 LAB — WET PREP FOR TRICH, YEAST, CLUE
Clue Cell Exam: NEGATIVE
Trichomonas Exam: NEGATIVE
Yeast Exam: NEGATIVE

## 2016-03-25 MED ORDER — METRONIDAZOLE 500 MG PO TABS: 500.0000 mg | ORAL_TABLET | Freq: Two times a day (BID) | ORAL | Status: DC

## 2016-04-12 ENCOUNTER — Other Ambulatory Visit: Payer: Self-pay | Admitting: *Deleted

## 2016-04-12 DIAGNOSIS — K2100 Gastro-esophageal reflux disease with esophagitis, without bleeding: Secondary | ICD-10-CM

## 2016-04-12 MED ORDER — RANITIDINE HCL 150 MG PO TABS: 150.0000 mg | ORAL_TABLET | Freq: Two times a day (BID) | ORAL | Status: DC

## 2016-04-12 MED ORDER — FUROSEMIDE 40 MG PO TABS: 40.0000 mg | ORAL_TABLET | Freq: Every day | ORAL | Status: DC

## 2016-04-12 MED ORDER — RAMIPRIL 10 MG PO CAPS: ORAL_CAPSULE | ORAL | Status: DC

## 2016-04-12 MED ORDER — METFORMIN HCL 500 MG PO TABS: 500.0000 mg | ORAL_TABLET | Freq: Two times a day (BID) | ORAL | Status: DC

## 2016-04-13 ENCOUNTER — Other Ambulatory Visit: Payer: Self-pay | Admitting: *Deleted

## 2016-04-13 DIAGNOSIS — K2100 Gastro-esophageal reflux disease with esophagitis, without bleeding: Secondary | ICD-10-CM

## 2016-04-13 MED ORDER — RANITIDINE HCL 150 MG PO TABS: 150.0000 mg | ORAL_TABLET | Freq: Two times a day (BID) | ORAL | Status: DC

## 2016-04-13 MED ORDER — FUROSEMIDE 40 MG PO TABS: 40.0000 mg | ORAL_TABLET | Freq: Every day | ORAL | Status: DC

## 2016-04-13 MED ORDER — METFORMIN HCL 500 MG PO TABS: 500.0000 mg | ORAL_TABLET | Freq: Two times a day (BID) | ORAL | Status: DC

## 2016-04-13 MED ORDER — RAMIPRIL 10 MG PO CAPS: ORAL_CAPSULE | ORAL | Status: DC

## 2016-04-13 MED FILL — FUROSEMIDE 40 MG TABLET: 40 | 90 days supply | Qty: 90 | Fill #0

## 2016-04-13 MED FILL — metFORMIN HCL 500 MG TABS: 500 | 90 days supply | Qty: 180 | Fill #0

## 2016-04-13 MED FILL — raNITIdine HCL 150 MG TABS: 150 | 90 days supply | Qty: 180 | Fill #0

## 2016-04-13 MED FILL — RAMIPRIL 10 MG CAPSULE: 10 | 90 days supply | Qty: 180 | Fill #0

## 2016-04-21 DIAGNOSIS — Z01419 Encounter for gynecological examination (general) (routine) without abnormal findings: Secondary | ICD-10-CM | POA: Diagnosis not present

## 2016-04-21 DIAGNOSIS — Z6836 Body mass index (BMI) 36.0-36.9, adult: Secondary | ICD-10-CM | POA: Diagnosis not present

## 2016-04-28 ENCOUNTER — Ambulatory Visit (INDEPENDENT_AMBULATORY_CARE_PROVIDER_SITE_OTHER): Payer: 59

## 2016-04-28 ENCOUNTER — Other Ambulatory Visit: Payer: Self-pay | Admitting: Family Medicine

## 2016-04-28 DIAGNOSIS — Z780 Asymptomatic menopausal state: Secondary | ICD-10-CM

## 2016-05-03 ENCOUNTER — Encounter: Payer: Self-pay | Admitting: Family Medicine

## 2016-05-03 ENCOUNTER — Ambulatory Visit (INDEPENDENT_AMBULATORY_CARE_PROVIDER_SITE_OTHER): Payer: 59 | Admitting: Family Medicine

## 2016-05-03 VITALS — BP 123/69 | HR 78 | Temp 97.1°F | Ht 67.0 in | Wt 238.4 lb

## 2016-05-03 DIAGNOSIS — L918 Other hypertrophic disorders of the skin: Secondary | ICD-10-CM

## 2016-05-03 NOTE — Progress Notes (Signed)
BP 123/69 (BP Location: Left Arm, Patient Position: Sitting, Cuff Size: Large)   Pulse 78   Temp 97.1 F (36.2 C) (Oral)   Ht 5\' 7"  (1.702 m)   Wt 238 lb 6.4 oz (108.1 kg)   BMI 37.34 kg/m    Subjective:    Patient ID: [name], female    DOB: 1961-01-05, 55 y.o.   MRN: [id number]  HPI: [name] is a 55 y.o. female presenting on 05/03/2016 for Skin tag removal   HPI Irritated inflamed skin tags. Patient has 2 skin tags that are irritated and inflamed one under her left axilla near her bra line and 1 in her right anterior upper thigh near her underwear line. They have been irritated and inflamed for quite some time now off and on. She would like to have them removed.  Relevant past medical, surgical, family and social history reviewed and updated as indicated. Interim medical history since our last visit reviewed. Allergies and medications reviewed and updated.  Review of Systems  Respiratory: Negative for shortness of breath.   Cardiovascular: Negative for chest pain and leg swelling.  Skin: Negative for color change and rash.    Per HPI unless specifically indicated above     Medication List       Accurate as of 05/03/16  5:09 PM. Always use your most recent med list.          ALPRAZolam 0.5 MG tablet Commonly known as:  XANAX Take 1 tablet (0.5 mg total) by mouth at bedtime as needed for sleep.   aspirin EC 81 MG tablet Take 81 mg by mouth daily.   furosemide 40 MG tablet Commonly known as:  LASIX Take 1 tablet (40 mg total) by mouth daily.   glucose blood test strip Commonly known as:  ONETOUCH VERIO Check fasting blood sugar 1X a day and prn    Dx 250.01   metFORMIN 500 MG tablet Commonly known as:  GLUCOPHAGE Take 1 tablet (500 mg total) by mouth 2 (two) times daily. Do not send to [facility]   omeprazole 20 MG capsule Commonly known as:  PRILOSEC Take 1 capsule (20 mg total) by mouth daily.   ramipril 10 MG capsule Commonly known as:   ALTACE TAKE 2 CAPSULES BY MOUTH ONCE DAILY.   ranitidine 150 MG tablet Commonly known as:  ZANTAC Take 1 tablet (150 mg total) by mouth 2 (two) times daily.          Objective:    BP 123/69 (BP Location: Left Arm, Patient Position: Sitting, Cuff Size: Large)   Pulse 78   Temp 97.1 F (36.2 C) (Oral)   Ht 5\' 7"  (1.702 m)   Wt 238 lb 6.4 oz (108.1 kg)   BMI 37.34 kg/m   Wt Readings from Last 3 Encounters:  05/03/16 238 lb 6.4 oz (108.1 kg)  02/14/16 235 lb (106.6 kg)  01/28/16 241 lb 9.6 oz (109.6 kg)    Physical Exam  Constitutional: She is oriented to person, place, and time. She appears well-developed and well-nourished. No distress.  Eyes: Conjunctivae and EOM are normal. Pupils are equal, round, and reactive to light.  Cardiovascular: Normal rate, regular rhythm, normal heart sounds and intact distal pulses.   No murmur heard. Pulmonary/Chest: Effort normal and breath sounds normal. No respiratory distress. She has no wheezes.  Musculoskeletal: Normal range of motion. She exhibits no edema or tenderness.  Neurological: She is alert and oriented to person, place, and  time. Coordination normal.  Skin: Skin is warm and dry. Lesion (Patient has 2 skin tags one slightly larger under her left axilla near her bra line and one in her anterior upper right thigh that is smaller.) noted. No rash noted. She is not diaphoretic.  Psychiatric: She has a normal mood and affect. Her behavior is normal.  Nursing note and vitals reviewed.   Skin tag removal: Patient had 2 skin tags that were inflamed and irritated one on right anterior upper thigh and one under left axilla near brow line. Risk factors were discussed with patient. Both removed with forceps and scissors. Minimal bleeding and simple bandage was placed over top.    Assessment & Plan:   Problem List Items Addressed This Visit    None    Visit Diagnoses    Inflamed skin tag    -  Primary   1 under left axilla and right  upper anterior thigh       Follow up plan: Return if symptoms worsen or fail to improve.  Counseling provided for all of the vaccine components No orders of the defined types were placed in this encounter.   Caryl Pina, MD [facility] 05/03/2016, 5:09 PM

## 2016-05-17 ENCOUNTER — Encounter: Payer: Self-pay | Admitting: Family Medicine

## 2016-05-17 ENCOUNTER — Ambulatory Visit (INDEPENDENT_AMBULATORY_CARE_PROVIDER_SITE_OTHER): Payer: 59 | Admitting: Family Medicine

## 2016-05-17 VITALS — BP 123/69 | HR 85 | Temp 97.1°F | Ht 67.0 in | Wt 240.8 lb

## 2016-05-17 DIAGNOSIS — J309 Allergic rhinitis, unspecified: Secondary | ICD-10-CM

## 2016-05-17 DIAGNOSIS — B372 Candidiasis of skin and nail: Secondary | ICD-10-CM

## 2016-05-17 NOTE — Progress Notes (Signed)
BP 123/69 (BP Location: Left Arm, Patient Position: Sitting, Cuff Size: Large)   Pulse 85   Temp 97.1 F (36.2 C) (Oral)   Ht 5\' 7"  (1.702 m)   Wt 240 lb 12.8 oz (109.2 kg)   BMI 37.71 kg/m    Subjective:    Patient ID: [name], female    DOB: 01-24-1961, 55 y.o.   MRN: [id number]  HPI: [name] is a 55 y.o. female presenting on 05/17/2016 for Rash (underneath left arm. used husband's Nystatin cream on Saturday); Fluid in right ear (feels like she has fluid in this ear); and Sore Throat   HPI Cough and congestion and pressure near Patient has been having cough and congestion and popping and pressure in her right ear that's been going on for the past 2 or 3 days. She denies any fevers or chills or shortness of breath or wheezing. She has not had a productive cough.  Rash under left arm Patient has had a rash under her left arm and her axilla for the past couple days. She was working out a lot in the heat over this weekend and developed after this point. The rash has been irritated but not itchy. She also says she may have changed her deodorants 2 months ago but did not have this issue until this weekend.  Relevant past medical, surgical, family and social history reviewed and updated as indicated. Interim medical history since our last visit reviewed. Allergies and medications reviewed and updated.  Review of Systems  Constitutional: Negative for chills and fever.  HENT: Positive for congestion, ear pain, postnasal drip, rhinorrhea, sinus pressure, sneezing and sore throat. Negative for ear discharge.   Eyes: Negative for pain, redness and visual disturbance.  Respiratory: Positive for cough. Negative for chest tightness, shortness of breath and wheezing.   Cardiovascular: Negative for chest pain and leg swelling.  Genitourinary: Negative for difficulty urinating and dysuria.  Musculoskeletal: Negative for back pain and gait problem.  Skin: Negative for rash.    Neurological: Negative for light-headedness and headaches.  Psychiatric/Behavioral: Negative for agitation and behavioral problems.  All other systems reviewed and are negative.   Per HPI unless specifically indicated above     Medication List       Accurate as of 05/17/16 10:24 AM. Always use your most recent med list.          ALPRAZolam 0.5 MG tablet Commonly known as:  XANAX Take 1 tablet (0.5 mg total) by mouth at bedtime as needed for sleep.   aspirin EC 81 MG tablet Take 81 mg by mouth daily.   furosemide 40 MG tablet Commonly known as:  LASIX Take 1 tablet (40 mg total) by mouth daily.   glucose blood test strip Commonly known as:  ONETOUCH VERIO Check fasting blood sugar 1X a day and prn    Dx 250.01   metFORMIN 500 MG tablet Commonly known as:  GLUCOPHAGE Take 1 tablet (500 mg total) by mouth 2 (two) times daily. Do not send to [facility]   omeprazole 20 MG capsule Commonly known as:  PRILOSEC Take 1 capsule (20 mg total) by mouth daily.   ramipril 10 MG capsule Commonly known as:  ALTACE TAKE 2 CAPSULES BY MOUTH ONCE DAILY.   ranitidine 150 MG tablet Commonly known as:  ZANTAC Take 1 tablet (150 mg total) by mouth 2 (two) times daily.          Objective:    BP 123/69 (  BP Location: Left Arm, Patient Position: Sitting, Cuff Size: Large)   Pulse 85   Temp 97.1 F (36.2 C) (Oral)   Ht 5\' 7"  (1.702 m)   Wt 240 lb 12.8 oz (109.2 kg)   BMI 37.71 kg/m   Wt Readings from Last 3 Encounters:  05/17/16 240 lb 12.8 oz (109.2 kg)  05/03/16 238 lb 6.4 oz (108.1 kg)  02/14/16 235 lb (106.6 kg)    Physical Exam  Constitutional: She is oriented to person, place, and time. She appears well-developed and well-nourished. No distress.  HENT:  Right Ear: Tympanic membrane, external ear and ear canal normal.  Left Ear: Tympanic membrane, external ear and ear canal normal.  Nose: Mucosal edema and rhinorrhea present. No epistaxis. Right sinus exhibits no  maxillary sinus tenderness and no frontal sinus tenderness. Left sinus exhibits no maxillary sinus tenderness and no frontal sinus tenderness.  Mouth/Throat: Uvula is midline and mucous membranes are normal. Posterior oropharyngeal edema and posterior oropharyngeal erythema present. No oropharyngeal exudate or tonsillar abscesses.  Eyes: Conjunctivae and EOM are normal.  Cardiovascular: Normal rate, regular rhythm, normal heart sounds and intact distal pulses.   No murmur heard. Pulmonary/Chest: Effort normal and breath sounds normal. No respiratory distress. She has no wheezes.  Musculoskeletal: Normal range of motion. She exhibits no edema or tenderness.  Neurological: She is alert and oriented to person, place, and time. Coordination normal.  Skin: Skin is warm and dry. No rash noted. She is not diaphoretic.  Psychiatric: She has a normal mood and affect. Her behavior is normal.  Vitals reviewed.     Assessment & Plan:   Problem List Items Addressed This Visit    None    Visit Diagnoses    Allergic rhinitis, unspecified allergic rhinitis type    -  Primary   Use Flonase, antihistamine, nasal saline, Mucinex   Yeast dermatitis       Under left axilla, was working a lot in the heat and sweating this weekend, has nystatin/triamcinolone cream, use twice a day for 7 days       Follow up plan: Return in about 2 months (around 07/17/2016), or if symptoms worsen or fail to improve.  Counseling provided for all of the vaccine components No orders of the defined types were placed in this encounter.   Caryl Pina, MD [facility] 05/17/2016, 10:24 AM

## 2016-06-11 ENCOUNTER — Ambulatory Visit: Payer: 59 | Admitting: Family

## 2016-06-23 ENCOUNTER — Encounter: Payer: Self-pay | Admitting: Family Medicine

## 2016-06-23 ENCOUNTER — Ambulatory Visit (INDEPENDENT_AMBULATORY_CARE_PROVIDER_SITE_OTHER): Payer: 59 | Admitting: Family Medicine

## 2016-06-23 VITALS — BP 126/72 | HR 83 | Temp 97.6°F | Ht 67.0 in | Wt 239.4 lb

## 2016-06-23 DIAGNOSIS — M722 Plantar fascial fibromatosis: Secondary | ICD-10-CM

## 2016-06-23 MED ORDER — KETOROLAC TROMETHAMINE 60 MG/2ML IM SOLN
60.0000 mg | Freq: Once | INTRAMUSCULAR | Status: AC
  Administered 2016-06-23: 60 mg via INTRAMUSCULAR

## 2016-06-23 NOTE — Progress Notes (Addendum)
BP 126/72   Pulse 83   Temp 97.6 F (36.4 C) (Oral)   Ht 5\' 7"  (1.702 m)   Wt 239 lb 6 oz (108.6 kg)   BMI 37.49 kg/m    Subjective:    Patient ID: [name], female    DOB: [birth year], 55 y.o.   MRN: [id number]  HPI: [name] is a 55 y.o. female presenting on 06/23/2016 for Bilateral feet and hand pain   HPI Bilateral foot pain Patient comes in today because she has been having increasing bilateral foot pain over the past 3 days. She says it started when she is walking a lot wearing flip-flops and barefoot and started getting pains just below her heel that started to extend up and then she started developing achiness in her knees and her hips over the past day and she thinks even maybe in her hands as well. She tried to worsen sneakers yesterday but they didn't do as well and today she bought some inserts and she feels like they are doing a little bit better. She denies any fevers or chills or dizziness or nausea or vomiting. She denies any new fatigue  Relevant past medical, surgical, family and social history reviewed and updated as indicated. Interim medical history since our last visit reviewed. Allergies and medications reviewed and updated.  Review of Systems  Constitutional: Negative for chills and fever.  HENT: Negative for congestion, ear discharge and ear pain.   Eyes: Negative for redness and visual disturbance.  Respiratory: Negative for chest tightness and shortness of breath.   Cardiovascular: Negative for chest pain and leg swelling.  Genitourinary: Negative for difficulty urinating and dysuria.  Musculoskeletal: Positive for arthralgias and myalgias. Negative for back pain and gait problem.  Skin: Negative for rash.  Neurological: Negative for light-headedness and headaches.  Psychiatric/Behavioral: Negative for agitation and behavioral problems.  All other systems reviewed and are negative.   Per HPI unless specifically indicated above       Medication List       Accurate as of 06/23/16 11:23 AM. Always use your most recent med list.          ALPRAZolam 0.5 MG tablet Commonly known as:  XANAX Take 1 tablet (0.5 mg total) by mouth at bedtime as needed for sleep.   aspirin EC 81 MG tablet Take 81 mg by mouth daily.   furosemide 40 MG tablet Commonly known as:  LASIX Take 1 tablet (40 mg total) by mouth daily.   glucose blood test strip Commonly known as:  ONETOUCH VERIO Check fasting blood sugar 1X a day and prn    Dx 250.01   metFORMIN 500 MG tablet Commonly known as:  GLUCOPHAGE Take 1 tablet (500 mg total) by mouth 2 (two) times daily. Do not send to [facility]   omeprazole 20 MG capsule Commonly known as:  PRILOSEC Take 1 capsule (20 mg total) by mouth daily.   ramipril 10 MG capsule Commonly known as:  ALTACE TAKE 2 CAPSULES BY MOUTH ONCE DAILY.   ranitidine 150 MG tablet Commonly known as:  ZANTAC Take 1 tablet (150 mg total) by mouth 2 (two) times daily.          Objective:    BP 126/72   Pulse 83   Temp 97.6 F (36.4 C) (Oral)   Ht 5\' 7"  (1.702 m)   Wt 239 lb 6 oz (108.6 kg)   BMI 37.49 kg/m   Wt Readings from Last  3 Encounters:  06/23/16 239 lb 6 oz (108.6 kg)  05/17/16 240 lb 12.8 oz (109.2 kg)  05/03/16 238 lb 6.4 oz (108.1 kg)    Physical Exam  Constitutional: She is oriented to person, place, and time. She appears well-developed and well-nourished. No distress.  Eyes: Conjunctivae are normal.  Musculoskeletal: Normal range of motion. She exhibits no edema.       Right hip: She exhibits normal range of motion and no tenderness.       Left hip: She exhibits normal range of motion and no tenderness.       Right knee: She exhibits normal range of motion and no swelling. No tenderness found.       Left knee: She exhibits normal range of motion and no swelling. No tenderness found.       Right foot: There is tenderness. There is normal range of motion and no swelling.       Left  foot: There is tenderness. There is normal range of motion and no swelling.  Patient has bilateral pain on the plantar surface of her foot near her heel. No inflammation or erythema is noted  Neurological: She is alert and oriented to person, place, and time. Coordination normal.  Skin: Skin is warm and dry. No rash noted. She is not diaphoretic.  Psychiatric: She has a normal mood and affect. Her behavior is normal.  Nursing note and vitals reviewed.     Assessment & Plan:   Problem List Items Addressed This Visit    None    Visit Diagnoses    Plantar fasciitis, bilateral    -  Primary   Relevant Medications   ketorolac (TORADOL) injection 60 mg (Start on 06/23/2016 11:30 AM)       Follow up plan: Return if symptoms worsen or fail to improve.  Counseling provided for all of the vaccine components No orders of the defined types were placed in this encounter.   Caryl Pina, MD [facility] 06/23/2016, 11:23 AM

## 2016-07-06 ENCOUNTER — Other Ambulatory Visit: Payer: Self-pay | Admitting: Family Medicine

## 2016-07-06 ENCOUNTER — Other Ambulatory Visit: Payer: Self-pay | Admitting: *Deleted

## 2016-07-06 ENCOUNTER — Other Ambulatory Visit (INDEPENDENT_AMBULATORY_CARE_PROVIDER_SITE_OTHER): Payer: 59

## 2016-07-06 ENCOUNTER — Other Ambulatory Visit: Payer: 59

## 2016-07-06 DIAGNOSIS — M79671 Pain in right foot: Secondary | ICD-10-CM | POA: Diagnosis not present

## 2016-07-07 ENCOUNTER — Ambulatory Visit: Payer: 59 | Admitting: Family Medicine

## 2016-07-08 MED FILL — raNITIdine HCL 150 MG TABS: 150 | 90 days supply | Qty: 180 | Fill #1

## 2016-07-08 MED FILL — RAMIPRIL 10 MG CAPSULE: 10 | 90 days supply | Qty: 180 | Fill #1

## 2016-07-08 MED FILL — metFORMIN HCL 500 MG TABS: 500 | 90 days supply | Qty: 180 | Fill #1

## 2016-07-08 MED FILL — FUROSEMIDE 40 MG TABLET: 40 | 90 days supply | Qty: 90 | Fill #1

## 2016-07-14 ENCOUNTER — Ambulatory Visit: Payer: 59 | Admitting: Family Medicine

## 2016-09-01 ENCOUNTER — Ambulatory Visit (INDEPENDENT_AMBULATORY_CARE_PROVIDER_SITE_OTHER): Payer: 59 | Admitting: Family Medicine

## 2016-09-01 ENCOUNTER — Other Ambulatory Visit: Payer: 59 | Admitting: Family Medicine

## 2016-09-01 ENCOUNTER — Encounter: Payer: Self-pay | Admitting: Family Medicine

## 2016-09-01 VITALS — BP 133/74 | HR 89 | Ht 67.0 in | Wt 242.4 lb

## 2016-09-01 DIAGNOSIS — L918 Other hypertrophic disorders of the skin: Secondary | ICD-10-CM

## 2016-09-01 DIAGNOSIS — E119 Type 2 diabetes mellitus without complications: Secondary | ICD-10-CM | POA: Diagnosis not present

## 2016-09-01 DIAGNOSIS — E785 Hyperlipidemia, unspecified: Secondary | ICD-10-CM | POA: Diagnosis not present

## 2016-09-01 NOTE — Progress Notes (Signed)
   BP 133/74   Pulse 89   Ht '5\' 7"'$  (1.702 m)   Wt 242 lb 6 oz (109.9 kg)   BMI 37.96 kg/m    Subjective:    Patient ID: [name], female    DOB: Jul 22, 1961, 55 y.o.   MRN: [id number]  HPI: [name] is a 55 y.o. female presenting on 09/01/2016 for skin tag removal   HPI Irritated skin tags  Patient has multiple irritated skin tags near her bra line. She has one in the middle on her back and one on her right back and a few under the axilla on both sides near her bra lines again irritated and inflamed and sometimes bleed. She denies any fevers or chills or redness or warmth or drainage.  Relevant past medical, surgical, family and social history reviewed and updated as indicated. Interim medical history since our last visit reviewed. Allergies and medications reviewed and updated.  Review of Systems  Constitutional: Negative for chills and fever.  Respiratory: Negative for chest tightness and shortness of breath.   Cardiovascular: Negative for chest pain and leg swelling.  Skin: Negative for rash.  All other systems reviewed and are negative.   Per HPI unless specifically indicated above      Objective:    BP 133/74   Pulse 89   Ht '5\' 7"'$  (1.702 m)   Wt 242 lb 6 oz (109.9 kg)   BMI 37.96 kg/m   Wt Readings from Last 3 Encounters:  09/01/16 242 lb 6 oz (109.9 kg)  06/23/16 239 lb 6 oz (108.6 kg)  05/17/16 240 lb 12.8 oz (109.2 kg)    Physical Exam  Constitutional: She appears well-developed and well-nourished. No distress.  Eyes: Conjunctivae are normal.  Musculoskeletal: Normal range of motion. She exhibits no edema.  Neurological: She is alert. Coordination normal.  Skin: Skin is warm and dry. Lesion (Multiple small skin tags near bra lines on back and axilla bilaterally) noted. No rash noted. She is not diaphoretic.  Psychiatric: She has a normal mood and affect. Her behavior is normal.  Nursing note and vitals reviewed.  Skin tag removal: Used forceps  and iris scissors to lift up skin tag and cut at base. No anesthesia needed. Patient tolerated very well. Minimal to no bleeding. removed a total of 6 skin tags    Assessment & Plan:   Problem List Items Addressed This Visit      Endocrine   Type 2 diabetes mellitus, controlled ([place])   Relevant Orders   Bayer DCA Hb A1c Waived   CMP14+EGFR    Other Visit Diagnoses    Inflamed skin tag    -  Primary   Removed 6 total, 2 from left axillary bra line, one from right axillary bra line and 3 from back near bra line   Hyperlipidemia LDL goal <100       Relevant Orders   Lipid panel       Follow up plan: Return in about 6 months (around 03/01/2017), or if symptoms worsen or fail to improve, for Recheck diabetes and hyperlipidemia.  Counseling provided for all of the vaccine components Orders Placed This Encounter  Procedures  . Bayer DCA Hb A1c Waived  . CMP14+EGFR  . Lipid panel    Caryl Pina, MD [facility] 09/01/2016, 12:33 PM

## 2016-09-13 ENCOUNTER — Other Ambulatory Visit: Payer: 59

## 2016-09-13 DIAGNOSIS — M545 Low back pain, unspecified: Secondary | ICD-10-CM

## 2016-09-13 LAB — URINALYSIS, COMPLETE
Bilirubin, UA: NEGATIVE
GLUCOSE, UA: NEGATIVE
LEUKOCYTES UA: NEGATIVE
NITRITE UA: NEGATIVE
Protein, UA: NEGATIVE
RBC, UA: NEGATIVE
SPEC GRAV UA: 1.025 (ref 1.005–1.030)
Urobilinogen, Ur: 0.2 mg/dL (ref 0.2–1.0)
pH, UA: 5.5 (ref 5.0–7.5)

## 2016-09-13 LAB — MICROSCOPIC EXAMINATION: Renal Epithel, UA: NONE SEEN /hpf

## 2016-09-22 ENCOUNTER — Other Ambulatory Visit: Payer: Self-pay

## 2016-09-22 DIAGNOSIS — K2100 Gastro-esophageal reflux disease with esophagitis, without bleeding: Secondary | ICD-10-CM

## 2016-09-22 MED ORDER — RANITIDINE HCL 150 MG PO TABS
150.0000 mg | ORAL_TABLET | Freq: Two times a day (BID) | ORAL | 3 refills | Status: DC
Start: 2016-09-22 — End: 2017-09-30

## 2016-09-22 MED ORDER — GLUCOSE BLOOD VI STRP: ORAL_STRIP | 1 refills | Status: DC

## 2016-09-22 MED ORDER — FUROSEMIDE 40 MG PO TABS: 40.0000 mg | ORAL_TABLET | Freq: Every day | ORAL | 1 refills | Status: DC

## 2016-09-22 MED ORDER — METFORMIN HCL 500 MG PO TABS: 500.0000 mg | ORAL_TABLET | Freq: Two times a day (BID) | ORAL | 1 refills | Status: DC

## 2016-09-22 MED ORDER — RAMIPRIL 10 MG PO CAPS: ORAL_CAPSULE | ORAL | 1 refills | Status: DC

## 2016-09-22 MED ORDER — OMEPRAZOLE 20 MG PO CPDR: 20.0000 mg | DELAYED_RELEASE_CAPSULE | Freq: Every day | ORAL | 3 refills | Status: DC

## 2016-09-22 MED FILL — RAMIPRIL 10 MG CAPSULE: 10 | 90 days supply | Qty: 180 | Fill #0

## 2016-09-22 MED FILL — raNITIdine HCL 150 MG TABS: 150 | 90 days supply | Qty: 180 | Fill #0 | Status: TO

## 2016-09-22 MED FILL — metFORMIN HCL 500 MG TABS: 500 | 90 days supply | Qty: 180 | Fill #0 | Status: TO

## 2016-09-22 MED FILL — OMEPRAZOLE DR 20 MG CAPSULE: 20 | 90 days supply | Qty: 90 | Fill #0

## 2016-09-22 MED FILL — FUROSEMIDE 40 MG TABLET: 40 | 90 days supply | Qty: 90 | Fill #0

## 2016-09-23 ENCOUNTER — Encounter (INDEPENDENT_AMBULATORY_CARE_PROVIDER_SITE_OTHER): Payer: 59

## 2016-09-23 ENCOUNTER — Ambulatory Visit (INDEPENDENT_AMBULATORY_CARE_PROVIDER_SITE_OTHER): Payer: 59

## 2016-09-23 ENCOUNTER — Other Ambulatory Visit: Payer: Self-pay | Admitting: Orthopedic Surgery

## 2016-09-23 DIAGNOSIS — M65331 Trigger finger, right middle finger: Secondary | ICD-10-CM

## 2016-09-30 DIAGNOSIS — H524 Presbyopia: Secondary | ICD-10-CM | POA: Diagnosis not present

## 2016-09-30 DIAGNOSIS — E119 Type 2 diabetes mellitus without complications: Secondary | ICD-10-CM | POA: Diagnosis not present

## 2016-09-30 LAB — HM DIABETES EYE EXAM

## 2016-10-14 ENCOUNTER — Other Ambulatory Visit: Payer: 59

## 2016-10-14 DIAGNOSIS — R300 Dysuria: Secondary | ICD-10-CM

## 2016-10-14 LAB — URINALYSIS, COMPLETE
Bilirubin, UA: NEGATIVE
Glucose, UA: NEGATIVE
Ketones, UA: NEGATIVE
LEUKOCYTES UA: NEGATIVE
Nitrite, UA: NEGATIVE
PH UA: 6.5 (ref 5.0–7.5)
Protein, UA: NEGATIVE
RBC, UA: NEGATIVE
SPEC GRAV UA: 1.01 (ref 1.005–1.030)
Urobilinogen, Ur: 0.2 mg/dL (ref 0.2–1.0)

## 2016-10-14 LAB — MICROSCOPIC EXAMINATION
RBC, UA: NONE SEEN /hpf (ref 0–?)
RENAL EPITHEL UA: NONE SEEN /HPF

## 2016-10-14 LAB — WET PREP FOR TRICH, YEAST, CLUE
CLUE CELL EXAM: NEGATIVE
Trichomonas Exam: NEGATIVE
Yeast Exam: NEGATIVE

## 2016-10-28 ENCOUNTER — Ambulatory Visit (INDEPENDENT_AMBULATORY_CARE_PROVIDER_SITE_OTHER): Payer: 59 | Admitting: Family Medicine

## 2016-10-28 ENCOUNTER — Encounter: Payer: Self-pay | Admitting: Family Medicine

## 2016-10-29 ENCOUNTER — Encounter: Payer: Self-pay | Admitting: Family Medicine

## 2016-10-29 ENCOUNTER — Other Ambulatory Visit: Payer: Self-pay

## 2016-10-29 ENCOUNTER — Ambulatory Visit (HOSPITAL_COMMUNITY)
Admission: RE | Admit: 2016-10-29 | Discharge: 2016-10-29 | Disposition: A | Discharge status: Home / Self Care | Payer: 59 | Source: Ambulatory Visit | Attending: Family Medicine | Admitting: Family Medicine

## 2016-10-29 ENCOUNTER — Ambulatory Visit (INDEPENDENT_AMBULATORY_CARE_PROVIDER_SITE_OTHER): Payer: 59 | Admitting: Family Medicine

## 2016-10-29 VITALS — BP 121/72 | HR 104 | Temp 97.1°F | Ht 67.0 in | Wt 242.1 lb

## 2016-10-29 DIAGNOSIS — R1011 Right upper quadrant pain: Secondary | ICD-10-CM

## 2016-10-29 DIAGNOSIS — R160 Hepatomegaly, not elsewhere classified: Secondary | ICD-10-CM | POA: Diagnosis not present

## 2016-10-29 DIAGNOSIS — K760 Fatty (change of) liver, not elsewhere classified: Secondary | ICD-10-CM | POA: Insufficient documentation

## 2016-10-29 DIAGNOSIS — R109 Unspecified abdominal pain: Secondary | ICD-10-CM | POA: Diagnosis not present

## 2016-10-29 LAB — URINALYSIS, COMPLETE
BILIRUBIN UA: NEGATIVE
Glucose, UA: NEGATIVE
KETONES UA: NEGATIVE
LEUKOCYTES UA: NEGATIVE
Nitrite, UA: NEGATIVE
PROTEIN UA: NEGATIVE
SPEC GRAV UA: 1.01 (ref 1.005–1.030)
Urobilinogen, Ur: 0.2 mg/dL (ref 0.2–1.0)
pH, UA: 7 (ref 5.0–7.5)

## 2016-10-29 LAB — MICROSCOPIC EXAMINATION
Bacteria, UA: NONE SEEN
RENAL EPITHEL UA: NONE SEEN /HPF

## 2016-10-29 LAB — POCT I-STAT CREATININE: CREATININE: 0.7 mg/dL (ref 0.44–1.00)

## 2016-10-29 MED ORDER — OMEPRAZOLE 20 MG PO CPDR: 20.0000 mg | DELAYED_RELEASE_CAPSULE | Freq: Two times a day (BID) | ORAL | 3 refills | Status: DC

## 2016-10-29 MED ORDER — IOPAMIDOL (ISOVUE-300) INJECTION 61%
100.0000 mL | Freq: Once | INTRAVENOUS | Status: AC | PRN
  Administered 2016-10-29: 100 mL via INTRAVENOUS

## 2016-10-29 NOTE — Progress Notes (Signed)
A user error has taken place: encounter opened in error, closed for administrative reasons.

## 2016-10-29 NOTE — Progress Notes (Signed)
BP 121/72   Pulse (!) 104   Temp 97.1 F (36.2 C) (Oral)   Ht 5\' 7"  (1.702 m)   Wt 242 lb 2 oz (109.8 kg)   BMI 37.92 kg/m    Subjective:    Patient ID: [name], female    DOB: 1961-04-19, 55 y.o.   MRN: [id number]  HPI: [name] is a 56 y.o. female presenting on 10/29/2016 for Flank Pain (right side pain)   HPI Right upper quadrant abdominal pain and epigastric abdominal pain Patient has been having right upper quadrant and epigastric abdominal pain that radiates around her right flank that's been going on since December 25 which was about a month ago. She says initially she thought it was from what she ate for Christmas and that her bad diet and poor intake led to her having some abdominal problems which she has had previously and it has persisted over the past month and at times has worsened. Here sitting in the office today she says it's about a 2 out of 10 for pain and is more squeezing and achy in nature but she has on some occasions had very sharp intense pain and one time specifically that she did have that sharp intense pain was last night which awoke her from sleep at around 3 AM and the pain was very severe for about 20-30 minutes. The pain is mostly on that right upper side of her abdomen and does radiate around her right flank. She thought maybe she was getting some kind of kidney infection or kidney stone. She denies any dysuria or hematuria or fevers. She did have some chills when the pain was intense and severe this morning but has not had chills otherwise. She denies any nausea or vomiting or changes in appetite. She says she's been having bowel movements about every other day which is normal for her.  Relevant past medical, surgical, family and social history reviewed and updated as indicated. Interim medical history since our last visit reviewed. Allergies and medications reviewed and updated.  Review of Systems  Constitutional: Negative for chills and fever.    Respiratory: Negative for chest tightness and shortness of breath.   Cardiovascular: Negative for chest pain and leg swelling.  Gastrointestinal: Positive for abdominal pain. Negative for blood in stool, constipation, diarrhea, nausea and vomiting.  Genitourinary: Positive for flank pain. Negative for decreased urine volume, difficulty urinating, dysuria, frequency, hematuria and urgency.  Musculoskeletal: Negative for back pain and gait problem.  Skin: Negative for rash.  Neurological: Negative for light-headedness and headaches.  Psychiatric/Behavioral: Negative for agitation and behavioral problems.  All other systems reviewed and are negative.   Per HPI unless specifically indicated above     Objective:    BP 121/72   Pulse (!) 104   Temp 97.1 F (36.2 C) (Oral)   Ht 5\' 7"  (1.702 m)   Wt 242 lb 2 oz (109.8 kg)   BMI 37.92 kg/m   Wt Readings from Last 3 Encounters:  10/29/16 242 lb 2 oz (109.8 kg)  10/28/16 242 lb 3.2 oz (109.9 kg)  09/01/16 242 lb 6 oz (109.9 kg)    Physical Exam  Constitutional: She is oriented to person, place, and time. She appears well-developed and well-nourished. No distress.  Eyes: Conjunctivae are normal.  Neck: Neck supple. No thyromegaly present.  Cardiovascular: Normal rate, regular rhythm, normal heart sounds and intact distal pulses.   No murmur heard. Pulmonary/Chest: Effort normal and breath sounds normal.  No respiratory distress. She has no wheezes.  Abdominal: Soft. Bowel sounds are normal. She exhibits no distension. There is no hepatosplenomegaly. There is tenderness in the right upper quadrant and epigastric area. There is no rigidity, no rebound, no guarding and no CVA tenderness. No hernia.  Musculoskeletal: Normal range of motion. She exhibits no edema or tenderness.  Neurological: She is alert and oriented to person, place, and time. Coordination normal.  Skin: Skin is warm and dry. No rash noted. She is not diaphoretic.   Psychiatric: She has a normal mood and affect. Her behavior is normal.  Nursing note and vitals reviewed.  Urinalysis: 0-5 wbc's, 0-2 RBCs, 0-10 epithelial cells, otherwise normal    Assessment & Plan:   Problem List Items Addressed This Visit    None    Visit Diagnoses    Right upper quadrant abdominal pain    -  Primary   Relevant Medications   omeprazole (PRILOSEC) 20 MG capsule   Other Relevant Orders   Urinalysis, Complete   CT Abdomen Pelvis W Contrast     Follow up plan: Return if symptoms worsen or fail to improve.  Counseling provided for all of the vaccine components Orders Placed This Encounter  Procedures  . CT Abdomen Pelvis W Contrast  . Urinalysis, Complete    Caryl Pina, MD [facility] 10/29/2016, 2:51 PM

## 2016-11-01 ENCOUNTER — Other Ambulatory Visit: Payer: 59

## 2016-11-01 ENCOUNTER — Other Ambulatory Visit: Payer: Self-pay | Admitting: Family Medicine

## 2016-11-01 DIAGNOSIS — E119 Type 2 diabetes mellitus without complications: Secondary | ICD-10-CM

## 2016-11-01 DIAGNOSIS — K760 Fatty (change of) liver, not elsewhere classified: Secondary | ICD-10-CM | POA: Diagnosis not present

## 2016-11-01 DIAGNOSIS — Z1159 Encounter for screening for other viral diseases: Secondary | ICD-10-CM | POA: Diagnosis not present

## 2016-11-01 DIAGNOSIS — E785 Hyperlipidemia, unspecified: Principal | ICD-10-CM

## 2016-11-01 DIAGNOSIS — E1169 Type 2 diabetes mellitus with other specified complication: Secondary | ICD-10-CM | POA: Diagnosis not present

## 2016-11-01 LAB — BAYER DCA HB A1C WAIVED: HB A1C (BAYER DCA - WAIVED): 7.1 % — ABNORMAL HIGH (ref <7.0)

## 2016-11-01 NOTE — Progress Notes (Signed)
Patient had fatty liver disease and we'll run labs to follow up this. Caryl Pina, MD [facility] 11/01/2016, 8:10 AM

## 2016-11-02 LAB — LIPID PANEL
CHOL/HDL RATIO: 3.9 ratio (ref 0.0–4.4)
CHOLESTEROL TOTAL: 181 mg/dL (ref 100–199)
HDL: 47 mg/dL (ref >39)
LDL CALC: 106 mg/dL — AB (ref 0–99)
TRIGLYCERIDES: 139 mg/dL (ref 0–149)
VLDL Cholesterol Cal: 28 mg/dL (ref 5–40)

## 2016-11-02 LAB — CMP14+EGFR
ALBUMIN: 4.6 g/dL (ref 3.5–5.5)
ALT: 36 IU/L — ABNORMAL HIGH (ref 0–32)
AST: 35 IU/L (ref 0–40)
Albumin/Globulin Ratio: 2 (ref 1.2–2.2)
Alkaline Phosphatase: 67 IU/L (ref 39–117)
BILIRUBIN TOTAL: 0.3 mg/dL (ref 0.0–1.2)
BUN / CREAT RATIO: 11 (ref 9–23)
BUN: 8 mg/dL (ref 6–24)
CALCIUM: 10.1 mg/dL (ref 8.7–10.2)
CHLORIDE: 96 mmol/L (ref 96–106)
CO2: 26 mmol/L (ref 18–29)
CREATININE: 0.74 mg/dL (ref 0.57–1.00)
GFR calc non Af Amer: 92 mL/min/{1.73_m2} (ref >59)
GFR, EST AFRICAN AMERICAN: 106 mL/min/{1.73_m2} (ref >59)
GLUCOSE: 143 mg/dL — AB (ref 65–99)
Globulin, Total: 2.3 g/dL (ref 1.5–4.5)
Potassium: 4.3 mmol/L (ref 3.5–5.2)
Sodium: 142 mmol/L (ref 134–144)
TOTAL PROTEIN: 6.9 g/dL (ref 6.0–8.5)

## 2016-11-02 LAB — CBC WITH DIFFERENTIAL/PLATELET
BASOS ABS: 0 10*3/uL (ref 0.0–0.2)
Basos: 0 %
EOS (ABSOLUTE): 0.2 10*3/uL (ref 0.0–0.4)
Eos: 3 %
HEMATOCRIT: 38.4 % (ref 34.0–46.6)
HEMOGLOBIN: 12.4 g/dL (ref 11.1–15.9)
IMMATURE GRANS (ABS): 0 10*3/uL (ref 0.0–0.1)
Immature Granulocytes: 0 %
LYMPHS: 23 %
Lymphocytes Absolute: 1.7 10*3/uL (ref 0.7–3.1)
MCH: 26.3 pg — AB (ref 26.6–33.0)
MCHC: 32.3 g/dL (ref 31.5–35.7)
MCV: 81 fL (ref 79–97)
MONOCYTES: 6 %
Monocytes Absolute: 0.4 10*3/uL (ref 0.1–0.9)
NEUTROS ABS: 5.1 10*3/uL (ref 1.4–7.0)
Neutrophils: 68 %
Platelets: 316 10*3/uL (ref 150–379)
RBC: 4.72 x10E6/uL (ref 3.77–5.28)
RDW: 14.8 % (ref 12.3–15.4)
WBC: 7.5 10*3/uL (ref 3.4–10.8)

## 2016-11-02 LAB — HEPATITIS C ANTIBODY: Hep C Virus Ab: <0.1 s/co ratio (ref 0.0–0.9)

## 2016-11-03 MED ORDER — PITAVASTATIN CALCIUM 2 MG PO TABS: 2.0000 mg | ORAL_TABLET | Freq: Every day | ORAL | 1 refills | Status: DC

## 2016-11-03 MED ORDER — LEVOFLOXACIN 500 MG PO TABS: 500.0000 mg | ORAL_TABLET | Freq: Every day | ORAL | 0 refills | Status: DC

## 2016-11-03 MED FILL — LIVALO 2 MG TABLET: 2 | 90 days supply | Qty: 90 | Fill #0 | Status: TO

## 2016-11-09 ENCOUNTER — Other Ambulatory Visit: Payer: Self-pay | Admitting: Family Medicine

## 2016-11-09 MED ORDER — FLUCONAZOLE 150 MG PO TABS: 150.0000 mg | ORAL_TABLET | Freq: Once | ORAL | 0 refills | Status: AC

## 2016-11-20 ENCOUNTER — Other Ambulatory Visit: Payer: Self-pay

## 2016-11-20 MED ORDER — LEVOFLOXACIN 500 MG PO TABS: 500.0000 mg | ORAL_TABLET | Freq: Every day | ORAL | 0 refills | Status: DC

## 2016-12-10 ENCOUNTER — Other Ambulatory Visit: Payer: Self-pay | Admitting: Family

## 2016-12-10 MED ORDER — LEVOFLOXACIN 500 MG PO TABS: 500.0000 mg | ORAL_TABLET | Freq: Every day | ORAL | 0 refills | Status: DC

## 2016-12-17 ENCOUNTER — Ambulatory Visit (INDEPENDENT_AMBULATORY_CARE_PROVIDER_SITE_OTHER): Payer: 59 | Admitting: Nurse Practitioner

## 2016-12-17 ENCOUNTER — Encounter: Payer: Self-pay | Admitting: Nurse Practitioner

## 2016-12-17 VITALS — BP 108/70 | HR 94 | Temp 97.9°F | Ht 67.0 in | Wt 242.4 lb

## 2016-12-17 DIAGNOSIS — R05 Cough: Secondary | ICD-10-CM

## 2016-12-17 DIAGNOSIS — R059 Cough, unspecified: Secondary | ICD-10-CM

## 2016-12-17 MED ORDER — METHYLPREDNISOLONE ACETATE 80 MG/ML IJ SUSP
80.0000 mg | Freq: Once | INTRAMUSCULAR | Status: AC
  Administered 2016-12-17: 80 mg via INTRAMUSCULAR

## 2016-12-17 NOTE — Patient Instructions (Signed)

## 2016-12-17 NOTE — Progress Notes (Signed)
   Subjective:    Patient ID: [name], female    DOB: July 29, 1961, 56 y.o.   MRN: [id number]  HPI Patient comes in today c/o cough, congestion and sore throat. Has had chills today and is starting to be achy.    Review of Systems  Constitutional: Negative.  Negative for chills and fever.  HENT: Positive for congestion, ear pain, sinus pain, sinus pressure, sore throat and trouble swallowing.   Respiratory: Positive for cough.   Cardiovascular: Negative.   Gastrointestinal: Negative.   Genitourinary: Negative.   Neurological: Positive for headaches.  Psychiatric/Behavioral: Negative.   All other systems reviewed and are negative.      Objective:   Physical Exam  Constitutional: She is oriented to person, place, and time. She appears well-developed and well-nourished. No distress.  HENT:  Right Ear: Hearing, tympanic membrane, external ear and ear canal normal.  Left Ear: Hearing, tympanic membrane, external ear and ear canal normal.  Nose: Mucosal edema and rhinorrhea present. Right sinus exhibits no maxillary sinus tenderness and no frontal sinus tenderness. Left sinus exhibits no maxillary sinus tenderness and no frontal sinus tenderness.  Mouth/Throat: Uvula is midline, oropharynx is clear and moist and mucous membranes are normal.  Neck: Normal range of motion. Neck supple.  Cardiovascular: Normal rate and regular rhythm.   Pulmonary/Chest: Effort normal and breath sounds normal.  Abdominal: Soft. Bowel sounds are normal.  Lymphadenopathy:    She has no cervical adenopathy.  Neurological: She is alert and oriented to person, place, and time.  Skin: Skin is warm and dry.  Psychiatric: She has a normal mood and affect. Her behavior is normal. Judgment and thought content normal.   BP 108/70   Pulse 94   Temp 97.9 F (36.6 C) (Oral)   Ht 5\' 7"  (1.702 m)   Wt 242 lb 6.4 oz (110 kg)   LMP 12/26/2014 (Approximate) Comment: 2-3 days  BMI 37.97 kg/m     Assessment &  Plan:  1. Cough 1. Take meds as prescribed 2. Use a cool mist humidifier especially during the winter months and when heat has been humid. 3. Use saline nose sprays frequently 4. Saline irrigations of the nose can be very helpful if done frequently.  * 4X daily for 1 week*  * Use of a nettie pot can be helpful with this. Follow directions with this* 5. Drink plenty of fluids 6. Keep thermostat turn down low 7.For any cough or congestion  Use plain Mucinex- regular strength or max strength is fine   * Children- consult with Pharmacist for dosing 8. For fever or aces or pains- take tylenol or ibuprofen appropriate for age and weight.  * for fevers greater than 101 orally you may alternate ibuprofen and tylenol every  3 hours.    - methylPREDNISolone acetate (DEPO-MEDROL) injection 80 mg; Inject 1 mL (80 mg total) into the muscle once.  [name]-Margaret Hassell Done, FNP

## 2016-12-18 ENCOUNTER — Other Ambulatory Visit: Payer: Self-pay | Admitting: Nurse Practitioner

## 2016-12-18 MED ORDER — AMOXICILLIN-POT CLAVULANATE 875-125 MG PO TABS: 1.0000 | ORAL_TABLET | Freq: Two times a day (BID) | ORAL | 0 refills | Status: DC

## 2016-12-21 DIAGNOSIS — R3915 Urgency of urination: Secondary | ICD-10-CM | POA: Diagnosis not present

## 2016-12-21 DIAGNOSIS — R109 Unspecified abdominal pain: Secondary | ICD-10-CM | POA: Diagnosis not present

## 2017-01-06 ENCOUNTER — Other Ambulatory Visit: Payer: Self-pay | Admitting: Family Medicine

## 2017-01-06 MED ORDER — AMOXICILLIN-POT CLAVULANATE 875-125 MG PO TABS: 1.0000 | ORAL_TABLET | Freq: Two times a day (BID) | ORAL | 0 refills | Status: DC

## 2017-01-14 MED FILL — FUROSEMIDE 40 MG TABLET: 40 | 90 days supply | Qty: 90 | Fill #1 | Status: TO

## 2017-01-14 MED FILL — RAMIPRIL 10 MG CAPSULE: 10 | 90 days supply | Qty: 180 | Fill #1 | Status: TO

## 2017-02-16 ENCOUNTER — Telehealth: Payer: Self-pay | Admitting: Family Medicine

## 2017-02-16 MED ORDER — AMOXICILLIN-POT CLAVULANATE 875-125 MG PO TABS: 1.0000 | ORAL_TABLET | Freq: Two times a day (BID) | ORAL | 0 refills | Status: DC

## 2017-02-16 NOTE — Telephone Encounter (Signed)
Pt called states she feels horrible, cough, pressure, drainage feels like a sinus infections. She would like if Augmentin or Levaquin could be called to [facility].

## 2017-02-16 NOTE — Telephone Encounter (Signed)
Aware, script sent in.  She wants provider to know she is very thankful!

## 2017-02-21 ENCOUNTER — Ambulatory Visit (INDEPENDENT_AMBULATORY_CARE_PROVIDER_SITE_OTHER): Payer: 59 | Admitting: Family

## 2017-02-21 ENCOUNTER — Encounter: Payer: Self-pay | Admitting: Family

## 2017-02-21 VITALS — BP 123/70 | HR 84 | Temp 97.1°F | Ht 67.0 in | Wt 234.0 lb

## 2017-02-21 DIAGNOSIS — J0101 Acute recurrent maxillary sinusitis: Secondary | ICD-10-CM

## 2017-02-21 MED ORDER — METHYLPREDNISOLONE ACETATE 80 MG/ML IJ SUSP
80.0000 mg | Freq: Once | INTRAMUSCULAR | Status: AC
  Administered 2017-02-21: 80 mg via INTRAMUSCULAR

## 2017-02-21 NOTE — Progress Notes (Signed)
   Subjective:    Patient ID: [name], female    DOB: 1961-06-17, 56 y.o.   MRN: [id number]  Headache   Associated symptoms include coughing, ear pain, sinus pressure and a sore throat.  Cough  Associated symptoms include ear pain, headaches and a sore throat.  Sinus Problem  This is a new problem. The current episode started 1 to 4 weeks ago. The problem has been gradually worsening since onset. There has been no fever. Her pain is at a severity of 3/10. The pain is moderate. Associated symptoms include congestion, coughing, ear pain, headaches, a hoarse voice, sinus pressure, sneezing and a sore throat. Past treatments include oral decongestants (augmentin).      Review of Systems  HENT: Positive for congestion, ear pain, hoarse voice, sinus pressure, sneezing and sore throat.   Respiratory: Positive for cough.   Neurological: Positive for headaches.  All other systems reviewed and are negative.      Objective:   Physical Exam  Constitutional: She is oriented to person, place, and time. She appears well-developed and well-nourished. No distress.  HENT:  Head: Normocephalic and atraumatic.  Right Ear: External ear normal. Tympanic membrane is bulging.  Nose: Mucosal edema and rhinorrhea present. Right sinus exhibits maxillary sinus tenderness and frontal sinus tenderness. Left sinus exhibits maxillary sinus tenderness and frontal sinus tenderness.  Mouth/Throat: Posterior oropharyngeal erythema present.  Eyes: Pupils are equal, round, and reactive to light.  Neck: Normal range of motion. Neck supple. No thyromegaly present.  Cardiovascular: Normal rate, regular rhythm, normal heart sounds and intact distal pulses.   No murmur heard. Pulmonary/Chest: Effort normal and breath sounds normal. No respiratory distress. She has no wheezes.  Abdominal: Soft. Bowel sounds are normal. She exhibits no distension. There is no tenderness.  Musculoskeletal: Normal range of motion. She  exhibits no edema or tenderness.  Neurological: She is alert and oriented to person, place, and time. She has normal reflexes. No cranial nerve deficit.  Skin: Skin is warm and dry.  Psychiatric: She has a normal mood and affect. Her behavior is normal. Judgment and thought content normal.  Vitals reviewed.     BP 123/70   Pulse 84   Temp 97.1 F (36.2 C) (Oral)   Ht 5\' 7"  (1.702 m)   Wt 234 lb (106.1 kg)   LMP 12/26/2014 (Approximate) Comment: 2-3 days  BMI 36.65 kg/m      Assessment & Plan:  1. Acute recurrent maxillary sinusitis - Take meds as prescribed - Use a cool mist humidifier  -Use saline nose sprays frequently -Saline irrigations of the nose can be very helpful if done frequently.  * 4X daily for 1 week*  * Use of a nettie pot can be helpful with this. Follow directions with this* -Force fluids -For any cough or congestion  Use plain Mucinex- regular strength or max strength is fine   * Children- consult with Pharmacist for dosing -For fever or aces or pains- take tylenol or ibuprofen appropriate for age and weight.  * for fevers greater than 101 orally you may alternate ibuprofen and tylenol every  3 hours. -Throat lozenges if help -New toothbrush in 3 days - methylPREDNISolone acetate (DEPO-MEDROL) injection 80 mg; Inject 1 mL (80 mg total) into the muscle once.   Evelina Dun, FNP

## 2017-02-21 NOTE — Patient Instructions (Signed)

## 2017-03-02 ENCOUNTER — Encounter: Payer: 59 | Admitting: *Deleted

## 2017-03-02 DIAGNOSIS — Z1231 Encounter for screening mammogram for malignant neoplasm of breast: Secondary | ICD-10-CM | POA: Diagnosis not present

## 2017-03-07 ENCOUNTER — Encounter: Payer: Self-pay | Admitting: Family

## 2017-03-07 ENCOUNTER — Ambulatory Visit (INDEPENDENT_AMBULATORY_CARE_PROVIDER_SITE_OTHER): Payer: 59 | Admitting: Family

## 2017-03-07 VITALS — BP 114/76 | HR 76 | Temp 97.2°F | Ht 67.0 in | Wt 234.0 lb

## 2017-03-07 DIAGNOSIS — J209 Acute bronchitis, unspecified: Secondary | ICD-10-CM

## 2017-03-07 MED ORDER — LEVOFLOXACIN 500 MG PO TABS
500.0000 mg | ORAL_TABLET | Freq: Every day | ORAL | 0 refills | Status: DC
Start: 2017-03-07 — End: 2017-04-01

## 2017-03-07 MED ORDER — FLUCONAZOLE 150 MG PO TABS: 150.0000 mg | ORAL_TABLET | ORAL | 0 refills | Status: DC | PRN

## 2017-03-07 NOTE — Progress Notes (Signed)
   Subjective:    Patient ID: [name], female    DOB: 1960/11/27, 56 y.o.   MRN: [id number]  Cough  This is a recurrent problem. The current episode started 1 to 4 weeks ago. The problem has been waxing and waning. The problem occurs every few minutes. The cough is productive of sputum and productive of purulent sputum. Associated symptoms include ear congestion, a sore throat, shortness of breath and wheezing. Pertinent negatives include no chills, ear pain, fever, headaches, myalgias, nasal congestion or rhinorrhea. The symptoms are aggravated by lying down. She has tried rest and oral steroids (augmentin) for the symptoms. The treatment provided mild relief. There is no history of asthma or COPD.      Review of Systems  Constitutional: Negative for chills and fever.  HENT: Positive for sore throat. Negative for ear pain and rhinorrhea.   Respiratory: Positive for cough, shortness of breath and wheezing.   Musculoskeletal: Negative for myalgias.  Neurological: Negative for headaches.  All other systems reviewed and are negative.      Objective:   Physical Exam  Constitutional: She is oriented to person, place, and time. She appears well-developed and well-nourished. No distress.  HENT:  Head: Normocephalic and atraumatic.  Right Ear: External ear normal.  Left Ear: External ear normal.  Nose: Mucosal edema and rhinorrhea present.  Mouth/Throat: Posterior oropharyngeal erythema present.  Eyes: Pupils are equal, round, and reactive to light.  Neck: Normal range of motion. Neck supple. No thyromegaly present.  Cardiovascular: Normal rate, regular rhythm, normal heart sounds and intact distal pulses.   No murmur heard. Pulmonary/Chest: Effort normal. No respiratory distress. She has wheezes. She has rhonchi.  Abdominal: Soft. Bowel sounds are normal. She exhibits no distension. There is no tenderness.  Musculoskeletal: Normal range of motion. She exhibits no edema or tenderness.   Neurological: She is alert and oriented to person, place, and time. She has normal reflexes. No cranial nerve deficit.  Skin: Skin is warm and dry.  Psychiatric: She has a normal mood and affect. Her behavior is normal. Judgment and thought content normal.  Vitals reviewed.     BP 114/76   Pulse 76   Temp 97.2 F (36.2 C) (Oral)   Ht 5\' 7"  (1.702 m)   Wt 234 lb (106.1 kg)   LMP 12/26/2014 (Approximate) Comment: 2-3 days  BMI 36.65 kg/m      Assessment & Plan:  1. Acute bronchitis, unspecified organism - Take meds as prescribed - Use a cool mist humidifier  -Use saline nose sprays frequently -Saline irrigations of the nose can be very helpful if done frequently.  * 4X daily for 1 week*  * Use of a nettie pot can be helpful with this. Follow directions with this* -Force fluids -For any cough or congestion  Use plain Mucinex- regular strength or max strength is fine   * Children- consult with Pharmacist for dosing -For fever or aces or pains- take tylenol or ibuprofen appropriate for age and weight.  * for fevers greater than 101 orally you may alternate ibuprofen and tylenol every  3 hours. -Throat lozenges if help -New toothbrush in 3 days - levofloxacin (LEVAQUIN) 500 MG tablet; Take 1 tablet (500 mg total) by mouth daily.  Dispense: 7 tablet; Refill: 0   Evelina Dun, FNP

## 2017-03-07 NOTE — Patient Instructions (Signed)

## 2017-03-09 ENCOUNTER — Telehealth: Payer: Self-pay | Admitting: Family Medicine

## 2017-03-09 MED ORDER — HYDROCODONE-HOMATROPINE 5-1.5 MG/5ML PO SYRP: 5.0000 mL | ORAL_SOLUTION | Freq: Three times a day (TID) | ORAL | 0 refills | Status: DC | PRN

## 2017-03-09 NOTE — Telephone Encounter (Signed)
RX ready for pick up 

## 2017-03-09 NOTE — Telephone Encounter (Signed)
Pt aware Rx ready

## 2017-03-11 ENCOUNTER — Other Ambulatory Visit: Payer: Self-pay | Admitting: Family Medicine

## 2017-03-11 ENCOUNTER — Telehealth: Payer: Self-pay | Admitting: Family Medicine

## 2017-03-11 MED ORDER — METFORMIN HCL 500 MG PO TABS: 500.0000 mg | ORAL_TABLET | Freq: Two times a day (BID) | ORAL | 0 refills | Status: DC

## 2017-03-11 MED ORDER — METFORMIN HCL 500 MG PO TABS
500.0000 mg | ORAL_TABLET | Freq: Two times a day (BID) | ORAL | 0 refills | Status: DC
Start: 2017-03-11 — End: 2017-03-11

## 2017-03-11 MED FILL — metFORMIN HCL 500 MG TABS: 500 | 90 days supply | Qty: 180 | Fill #0

## 2017-03-11 NOTE — Telephone Encounter (Signed)
.  What is the name of the medication? metformin  Have you contacted your pharmacy to request a refill? no  Which pharmacy would you like this sent to? [facility]   Patient notified that their request is being sent to the clinical staff for review and that they should receive a call once it is complete. If they do not receive a call within 24 hours they can check with their pharmacy or our office.   Patient just realized that she had one pill left. Needs just a 30 day supply to [facility].

## 2017-03-14 ENCOUNTER — Encounter: Payer: Self-pay | Admitting: *Deleted

## 2017-03-30 ENCOUNTER — Encounter: Payer: Self-pay | Admitting: Internal Medicine

## 2017-04-01 ENCOUNTER — Ambulatory Visit (INDEPENDENT_AMBULATORY_CARE_PROVIDER_SITE_OTHER): Payer: 59 | Admitting: Family

## 2017-04-01 ENCOUNTER — Encounter: Payer: Self-pay | Admitting: Family

## 2017-04-01 VITALS — BP 123/69 | HR 79 | Temp 98.0°F | Ht 67.0 in | Wt 238.0 lb

## 2017-04-01 DIAGNOSIS — R300 Dysuria: Secondary | ICD-10-CM | POA: Diagnosis not present

## 2017-04-01 DIAGNOSIS — R350 Frequency of micturition: Secondary | ICD-10-CM | POA: Diagnosis not present

## 2017-04-01 LAB — MICROSCOPIC EXAMINATION
BACTERIA UA: NONE SEEN
RBC, UA: NONE SEEN /hpf (ref 0–?)
RENAL EPITHEL UA: NONE SEEN /HPF

## 2017-04-01 LAB — URINALYSIS, COMPLETE
Bilirubin, UA: NEGATIVE
Glucose, UA: NEGATIVE
KETONES UA: NEGATIVE
LEUKOCYTES UA: NEGATIVE
Nitrite, UA: NEGATIVE
PH UA: 7 (ref 5.0–7.5)
Protein, UA: NEGATIVE
RBC, UA: NEGATIVE
SPEC GRAV UA: 1.01 (ref 1.005–1.030)
Urobilinogen, Ur: 0.2 mg/dL (ref 0.2–1.0)

## 2017-04-01 NOTE — Progress Notes (Signed)
   Subjective:    Patient ID: [name], female    DOB: 01-21-1961, 56 y.o.   MRN: [id number]  Dysuria   This is a new problem. The current episode started yesterday. The problem occurs intermittently. The problem has been waxing and waning. The quality of the pain is described as aching. The pain is mild. Associated symptoms include flank pain, frequency, hesitancy and urgency. Pertinent negatives include no hematuria. She has tried increased fluids for the symptoms. The treatment provided mild relief.      Review of Systems  Genitourinary: Positive for dysuria, flank pain, frequency, hesitancy and urgency. Negative for hematuria.  All other systems reviewed and are negative.      Objective:   Physical Exam  Constitutional: She is oriented to person, place, and time. She appears well-developed and well-nourished. No distress.  HENT:  Head: Normocephalic.  Eyes: Pupils are equal, round, and reactive to light.  Neck: Normal range of motion. Neck supple. No thyromegaly present.  Cardiovascular: Normal rate, regular rhythm, normal heart sounds and intact distal pulses.   No murmur heard. Pulmonary/Chest: Effort normal and breath sounds normal. No respiratory distress. She has no wheezes.  Abdominal: Soft. Bowel sounds are normal. She exhibits no distension. There is tenderness (lower abd tenderness).  Musculoskeletal: Normal range of motion. She exhibits no edema or tenderness.  Neurological: She is alert and oriented to person, place, and time.  Skin: Skin is warm and dry.  Psychiatric: She has a normal mood and affect. Her behavior is normal. Judgment and thought content normal.  Vitals reviewed.    BP 123/69   Pulse 79   Temp 98 F (36.7 C) (Oral)   Ht 5\' 7"  (1.702 m)   Wt 238 lb (108 kg)   LMP 12/26/2014 (Approximate) Comment: 2-3 days  BMI 37.28 kg/m      Assessment & Plan:  1. Dysuria - Urinalysis, Complete - Urine Culture  2. Urinary frequency Force  fluids AZO over the counter X2 days RTO prn Culture pending     Evelina Dun, FNP

## 2017-04-01 NOTE — Patient Instructions (Addendum)

## 2017-04-03 LAB — URINE CULTURE

## 2017-04-05 ENCOUNTER — Other Ambulatory Visit: Payer: Self-pay | Admitting: Family

## 2017-04-05 MED ORDER — NITROFURANTOIN MONOHYD MACRO 100 MG PO CAPS
100.0000 mg | ORAL_CAPSULE | Freq: Two times a day (BID) | ORAL | 0 refills | Status: DC
Start: 2017-04-05 — End: 2017-04-25

## 2017-04-05 MED ORDER — NITROFURANTOIN MONOHYD MACRO 100 MG PO CAPS: 100.0000 mg | ORAL_CAPSULE | Freq: Two times a day (BID) | ORAL | 0 refills | Status: DC

## 2017-04-07 ENCOUNTER — Encounter: Payer: Self-pay | Admitting: Internal Medicine

## 2017-04-07 MED FILL — FUROSEMIDE 40 MG TABLET: 40 | 90 days supply | Qty: 90 | Fill #0

## 2017-04-07 MED FILL — RAMIPRIL 10 MG CAPSULE: 10 | 90 days supply | Qty: 180 | Fill #0

## 2017-04-25 ENCOUNTER — Encounter: Payer: Self-pay | Admitting: Family

## 2017-04-25 ENCOUNTER — Ambulatory Visit (INDEPENDENT_AMBULATORY_CARE_PROVIDER_SITE_OTHER): Payer: 59 | Admitting: Family

## 2017-04-25 VITALS — BP 118/72 | HR 68 | Temp 97.8°F | Ht 67.0 in | Wt 241.0 lb

## 2017-04-25 DIAGNOSIS — L249 Irritant contact dermatitis, unspecified cause: Secondary | ICD-10-CM

## 2017-04-25 NOTE — Progress Notes (Signed)
   Subjective:    Patient ID: [name], female    DOB: 07/26/1961, 56 y.o.   MRN: [id number]  Rash  This is a recurrent problem. The current episode started 1 to 4 weeks ago. The problem is unchanged. The affected locations include the right axilla. The rash is characterized by pain, dryness and redness. Pertinent negatives include no cough, diarrhea or sore throat. Past treatments include antihistamine and anti-itch cream. The treatment provided mild relief.      Review of Systems  HENT: Negative for sore throat.   Respiratory: Negative for cough.   Gastrointestinal: Negative for diarrhea.  Skin: Positive for rash.  All other systems reviewed and are negative.      Objective:   Physical Exam  Constitutional: She is oriented to person, place, and time. She appears well-developed and well-nourished. No distress.  HENT:  Head: Normocephalic.  Cardiovascular: Normal rate, regular rhythm, normal heart sounds and intact distal pulses.   No murmur heard. Pulmonary/Chest: Effort normal and breath sounds normal. No respiratory distress. She has no wheezes.  Abdominal: Soft. Bowel sounds are normal. She exhibits no distension. There is no tenderness.  Musculoskeletal: Normal range of motion. She exhibits no edema or tenderness.  Neurological: She is alert and oriented to person, place, and time.  Skin: Skin is warm and dry. Rash noted.  Erythemas, cracked rash in right axilla  Psychiatric: She has a normal mood and affect. Her behavior is normal. Judgment and thought content normal.  Vitals reviewed.    BP 118/72   Pulse 68   Temp 97.8 F (36.6 C) (Oral)   Ht 5\' 7"  (1.702 m)   Wt 241 lb (109.3 kg)   LMP 12/26/2014 (Approximate) Comment: 2-3 days  BMI 37.75 kg/m      Assessment & Plan:  1. Irritant dermatitis PT to change deodorant with no fragrance for sensitive skin Eucrisa samples given to patient Do not scratch Keep clean and dry RTO prn     Evelina Dun,  FNP

## 2017-04-25 NOTE — Patient Instructions (Signed)

## 2017-06-08 ENCOUNTER — Encounter: Payer: Self-pay | Admitting: Internal Medicine

## 2017-06-27 ENCOUNTER — Encounter: Payer: 59 | Admitting: Internal Medicine

## 2017-06-29 ENCOUNTER — Ambulatory Visit: Payer: 59 | Admitting: Family Medicine

## 2017-07-04 ENCOUNTER — Other Ambulatory Visit: Payer: Self-pay

## 2017-07-04 DIAGNOSIS — E119 Type 2 diabetes mellitus without complications: Secondary | ICD-10-CM

## 2017-07-04 DIAGNOSIS — I10 Essential (primary) hypertension: Secondary | ICD-10-CM

## 2017-07-04 DIAGNOSIS — E785 Hyperlipidemia, unspecified: Secondary | ICD-10-CM

## 2017-07-04 MED FILL — metFORMIN HCL 500 MG TABS: 500 | 90 days supply | Qty: 180 | Fill #0

## 2017-07-04 MED FILL — RAMIPRIL 10 MG CAPSULE: 10 | 90 days supply | Qty: 180 | Fill #1

## 2017-07-04 MED FILL — FUROSEMIDE 40 MG TABS: 40 | 90 days supply | Qty: 90 | Fill #1

## 2017-07-04 MED FILL — LIVALO 2 MG TABLET: 2 | 90 days supply | Qty: 90 | Fill #0

## 2017-07-06 DIAGNOSIS — Z8679 Personal history of other diseases of the circulatory system: Secondary | ICD-10-CM | POA: Insufficient documentation

## 2017-07-06 DIAGNOSIS — Z124 Encounter for screening for malignant neoplasm of cervix: Secondary | ICD-10-CM | POA: Diagnosis not present

## 2017-07-06 DIAGNOSIS — Z01419 Encounter for gynecological examination (general) (routine) without abnormal findings: Secondary | ICD-10-CM | POA: Diagnosis not present

## 2017-07-06 MED FILL — raNITIdine HCL 150 MG TABS: 150 | 90 days supply | Qty: 180 | Fill #0

## 2017-07-07 ENCOUNTER — Other Ambulatory Visit: Payer: 59

## 2017-07-07 DIAGNOSIS — E119 Type 2 diabetes mellitus without complications: Secondary | ICD-10-CM

## 2017-07-07 DIAGNOSIS — E785 Hyperlipidemia, unspecified: Secondary | ICD-10-CM | POA: Diagnosis not present

## 2017-07-07 DIAGNOSIS — I10 Essential (primary) hypertension: Secondary | ICD-10-CM | POA: Diagnosis not present

## 2017-07-07 LAB — BAYER DCA HB A1C WAIVED: HB A1C (BAYER DCA - WAIVED): 6.3 % (ref <7.0)

## 2017-07-08 ENCOUNTER — Ambulatory Visit (INDEPENDENT_AMBULATORY_CARE_PROVIDER_SITE_OTHER): Payer: 59 | Admitting: Family Medicine

## 2017-07-08 ENCOUNTER — Encounter: Payer: Self-pay | Admitting: Family Medicine

## 2017-07-08 VITALS — BP 107/66 | HR 82 | Temp 97.9°F | Ht 67.0 in | Wt 234.2 lb

## 2017-07-08 DIAGNOSIS — I10 Essential (primary) hypertension: Secondary | ICD-10-CM

## 2017-07-08 DIAGNOSIS — E1159 Type 2 diabetes mellitus with other circulatory complications: Secondary | ICD-10-CM

## 2017-07-08 DIAGNOSIS — M25552 Pain in left hip: Secondary | ICD-10-CM

## 2017-07-08 DIAGNOSIS — E1169 Type 2 diabetes mellitus with other specified complication: Secondary | ICD-10-CM

## 2017-07-08 DIAGNOSIS — E119 Type 2 diabetes mellitus without complications: Secondary | ICD-10-CM | POA: Diagnosis not present

## 2017-07-08 DIAGNOSIS — E785 Hyperlipidemia, unspecified: Secondary | ICD-10-CM

## 2017-07-08 LAB — LIPID PANEL
CHOLESTEROL TOTAL: 187 mg/dL (ref 100–199)
Chol/HDL Ratio: 4.3 ratio (ref 0.0–4.4)
HDL: 44 mg/dL (ref >39)
LDL Calculated: 116 mg/dL — ABNORMAL HIGH (ref 0–99)
Triglycerides: 136 mg/dL (ref 0–149)
VLDL CHOLESTEROL CAL: 27 mg/dL (ref 5–40)

## 2017-07-08 LAB — CMP14+EGFR
A/G RATIO: 1.7 (ref 1.2–2.2)
ALBUMIN: 4.3 g/dL (ref 3.5–5.5)
ALK PHOS: 55 IU/L (ref 39–117)
ALT: 33 IU/L — AB (ref 0–32)
AST: 29 IU/L (ref 0–40)
BILIRUBIN TOTAL: 0.5 mg/dL (ref 0.0–1.2)
BUN/Creatinine Ratio: 9 (ref 9–23)
BUN: 6 mg/dL (ref 6–24)
CHLORIDE: 98 mmol/L (ref 96–106)
CO2: 26 mmol/L (ref 20–29)
Calcium: 9.6 mg/dL (ref 8.7–10.2)
Creatinine, Ser: 0.64 mg/dL (ref 0.57–1.00)
GFR calc Af Amer: 116 mL/min/{1.73_m2} (ref >59)
GFR calc non Af Amer: 101 mL/min/{1.73_m2} (ref >59)
Globulin, Total: 2.6 g/dL (ref 1.5–4.5)
Glucose: 130 mg/dL — ABNORMAL HIGH (ref 65–99)
POTASSIUM: 3.9 mmol/L (ref 3.5–5.2)
Sodium: 144 mmol/L (ref 134–144)
Total Protein: 6.9 g/dL (ref 6.0–8.5)

## 2017-07-08 LAB — VITAMIN D 25 HYDROXY (VIT D DEFICIENCY, FRACTURES): Vit D, 25-Hydroxy: 26.1 ng/mL — ABNORMAL LOW (ref 30.0–100.0)

## 2017-07-08 NOTE — Progress Notes (Signed)
BP 107/66   Pulse 82   Temp 97.9 F (36.6 C) (Oral)   Ht '5\' 7"'  (1.702 m)   Wt 234 lb 3.2 oz (106.2 kg)   LMP 12/26/2014 (Approximate) Comment: 2-3 days  BMI 36.68 kg/m    Subjective:    Patient ID: [name], female    DOB: 05-02-1961, 56 y.o.   MRN: [id number]  HPI: [name] is a 56 y.o. female presenting on 07/08/2017 for Diabetes (follow up); Hypertension; and Hyperlipidemia   HPI Type 2 diabetes mellitus Patient comes in today for recheck of his diabetes. Patient has been currently taking metformin. Patient is currently on an ACE inhibitor/ARB. Patient has not seen an ophthalmologist this year but plans to in December. Patient denies any issues with their feet.   Hypertension Patient is currently on ramipril, and their blood pressure today is 106/66. Patient denies any lightheadedness or dizziness. Patient denies headaches, blurred vision, chest pains, shortness of breath, or weakness. Denies any side effects from medication and is content with current medication.   Hyperlipidemia Patient is coming in for recheck of his hyperlipidemia. The patient is currently taking Livalo but she has not been taking it consistently, her LDL is 111 and would like it to be less than 100 and she will start taking it more consistently. They deny any issues with myalgias or history of liver damage from it. They deny any focal numbness or weakness or chest pain.   Left hip pain Patient comes in complaining of left anterior and posterior hip pain. She says sometimes it will hurt anterior groin and then sometimes it will hurt in her buttocks. She denies any radiation of the pain. She denies any fevers or chills or redness or warmth. She says a lot of times it will  Relevant past medical, surgical, family and social history reviewed and updated as indicated. Interim medical history since our last visit reviewed. Allergies and medications reviewed and updated.  Review of Systems    Constitutional: Negative for chills and fever.  HENT: Negative for congestion, ear discharge and ear pain.   Eyes: Negative for redness and visual disturbance.  Respiratory: Negative for chest tightness and shortness of breath.   Cardiovascular: Negative for chest pain and leg swelling.  Genitourinary: Negative for difficulty urinating and dysuria.  Musculoskeletal: Positive for arthralgias. Negative for back pain and gait problem.  Skin: Negative for rash.  Neurological: Negative for light-headedness and headaches.  Psychiatric/Behavioral: Negative for agitation and behavioral problems.  All other systems reviewed and are negative.   Per HPI unless specifically indicated above        Objective:    BP 107/66   Pulse 82   Temp 97.9 F (36.6 C) (Oral)   Ht '5\' 7"'  (1.702 m)   Wt 234 lb 3.2 oz (106.2 kg)   LMP 12/26/2014 (Approximate) Comment: 2-3 days  BMI 36.68 kg/m   Wt Readings from Last 3 Encounters:  07/08/17 234 lb 3.2 oz (106.2 kg)  04/25/17 241 lb (109.3 kg)  04/01/17 238 lb (108 kg)    Physical Exam  Constitutional: She is oriented to person, place, and time. She appears well-developed and well-nourished. No distress.  Eyes: Conjunctivae are normal.  Neck: Neck supple. No thyromegaly present.  Cardiovascular: Normal rate, regular rhythm, normal heart sounds and intact distal pulses.   No murmur heard. Pulmonary/Chest: Effort normal and breath sounds normal. No respiratory distress. She has no wheezes. She has no rales.  Musculoskeletal: Normal range  of motion. She exhibits no edema.       Left hip: She exhibits tenderness. She exhibits normal range of motion (Pain with external and internal rotation of the hip), normal strength, no swelling, no crepitus and no deformity.       Legs: Lymphadenopathy:    She has no cervical adenopathy.  Neurological: She is alert and oriented to person, place, and time. Coordination normal.  Skin: Skin is warm and dry. No rash  noted. She is not diaphoretic.  Psychiatric: She has a normal mood and affect. Her behavior is normal.  Nursing note and vitals reviewed.   Results for orders placed or performed in visit on 07/07/17  Bayer DCA Hb A1c Waived  Result Value Ref Range   Bayer DCA Hb A1c Waived 6.3 <7.0 %  CMP14+EGFR  Result Value Ref Range   Glucose 130 (H) 65 - 99 mg/dL   BUN 6 6 - 24 mg/dL   Creatinine, Ser 0.64 0.57 - 1.00 mg/dL   GFR calc non Af Amer 101 >59 mL/min/1.73   GFR calc Af Amer 116 >59 mL/min/1.73   BUN/Creatinine Ratio 9 9 - 23   Sodium 144 134 - 144 mmol/L   Potassium 3.9 3.5 - 5.2 mmol/L   Chloride 98 96 - 106 mmol/L   CO2 26 20 - 29 mmol/L   Calcium 9.6 8.7 - 10.2 mg/dL   Total Protein 6.9 6.0 - 8.5 g/dL   Albumin 4.3 3.5 - 5.5 g/dL   Globulin, Total 2.6 1.5 - 4.5 g/dL   Albumin/Globulin Ratio 1.7 1.2 - 2.2   Bilirubin Total 0.5 0.0 - 1.2 mg/dL   Alkaline Phosphatase 55 39 - 117 IU/L   AST 29 0 - 40 IU/L   ALT 33 (H) 0 - 32 IU/L  Lipid panel  Result Value Ref Range   Cholesterol, Total 187 100 - 199 mg/dL   Triglycerides 136 0 - 149 mg/dL   HDL 44 >39 mg/dL   VLDL Cholesterol Cal 27 5 - 40 mg/dL   LDL Calculated 116 (H) 0 - 99 mg/dL   Chol/HDL Ratio 4.3 0.0 - 4.4 ratio  VITAMIN D 25 Hydroxy (Vit-D Deficiency, Fractures)  Result Value Ref Range   Vit D, 25-Hydroxy 26.1 (L) 30.0 - 100.0 ng/mL      Assessment & Plan:   Problem List Items Addressed This Visit      Cardiovascular and Mediastinum   Hypertension associated with diabetes ([place])     Endocrine   Type 2 diabetes mellitus, controlled ([place])   Type 2 diabetes mellitus with hyperlipidemia ([place]) - Primary     Other   OBESITY    Other Visit Diagnoses    Left hip pain       Concern for osteoarthritis   Relevant Orders   DG HIP UNILAT W OR W/O PELVIS 2-3 VIEWS LEFT     Continue current medications, if she wants to take 2000 international units of vitamin D daily for being borderline low. Will do x-ray on  Monday when our technician his back.   Follow up plan: Return in about 3 months (around 10/08/2017), or if symptoms worsen or fail to improve, for Recheck diabetes.  Counseling provided for all of the vaccine components Orders Placed This Encounter  Procedures  . DG HIP UNILAT W OR W/O PELVIS 2-3 VIEWS LEFT    Caryl Pina, MD [facility] 07/08/2017, 1:44 PM

## 2017-07-11 ENCOUNTER — Other Ambulatory Visit (INDEPENDENT_AMBULATORY_CARE_PROVIDER_SITE_OTHER): Payer: 59

## 2017-07-11 DIAGNOSIS — M25552 Pain in left hip: Secondary | ICD-10-CM | POA: Diagnosis not present

## 2017-08-03 ENCOUNTER — Telehealth: Payer: Self-pay

## 2017-08-03 DIAGNOSIS — M5418 Radiculopathy, sacral and sacrococcygeal region: Secondary | ICD-10-CM

## 2017-08-03 MED ORDER — PREDNISONE 20 MG PO TABS: ORAL_TABLET | ORAL | 0 refills | Status: DC

## 2017-08-03 MED ORDER — MELOXICAM 15 MG PO TABS: 15.0000 mg | ORAL_TABLET | Freq: Every day | ORAL | 0 refills | Status: DC

## 2017-08-03 MED FILL — MELOXICAM 15 MG TABLET: 15 | 30 days supply | Qty: 30 | Fill #0

## 2017-08-03 NOTE — Telephone Encounter (Signed)
Pt was thinking she was going to get a steroid also. Please advise.

## 2017-08-03 NOTE — Telephone Encounter (Signed)
Patient is still having hip pain along with left leg numbness, pain in left buttock.  She has been taking ibuprofen.  Please advise.

## 2017-08-03 NOTE — Telephone Encounter (Signed)
Patient is having persistent left lower back pain and hip pain going down that lateral side.  Sent meloxicam.  Patient has been trying ibuprofen without much success.  Been going on for at least 4 weeks at this point.  Will try for MRI but may have to wait a couple weeks. Caryl Pina, MD [facility] 08/03/2017, 1:02 PM

## 2017-08-09 ENCOUNTER — Encounter: Payer: Self-pay | Admitting: Family Medicine

## 2017-08-09 ENCOUNTER — Ambulatory Visit (INDEPENDENT_AMBULATORY_CARE_PROVIDER_SITE_OTHER): Payer: 59 | Admitting: Family Medicine

## 2017-08-09 VITALS — BP 126/73 | HR 80 | Temp 95.8°F | Ht 67.0 in | Wt 240.4 lb

## 2017-08-09 DIAGNOSIS — J0110 Acute frontal sinusitis, unspecified: Secondary | ICD-10-CM

## 2017-08-09 MED ORDER — AMOXICILLIN-POT CLAVULANATE 875-125 MG PO TABS: 1.0000 | ORAL_TABLET | Freq: Two times a day (BID) | ORAL | 0 refills | Status: DC

## 2017-08-09 NOTE — Progress Notes (Signed)
   HPI  Patient presents today here with acute illness.  Patient's had cough, congestion, facial pressure, sore throat, and headache for about 2 days. Patient states that this seems to happen almost years.  She developed something like a sinus infection that sometimes progresses into her chest.  She like to have that off if possible.  She denies fever, chills, or sweats.  She states that the most irritating part is severe lateral frontal sinus pressure.  PMH: Smoking status noted ROS: Per HPI  Objective: BP 126/73   Pulse 80   Temp (!) 95.8 F (35.4 C) (Oral)   Ht 5\' 7"  (1.702 m)   Wt 240 lb 6.4 oz (109 kg)   LMP 12/26/2014 (Approximate) Comment: 2-3 days  BMI 37.65 kg/m  Gen: NAD, alert, cooperative with exam HEENT: NCAT, tenderness to palpation of bilateral frontal sinuses, oropharynx moist and clear with enlarged right-sided tonsil with erythema, TMs bilaterally with purulent effusion CV: RRR, good S1/S2, no murmur Resp: CTABL, no wheezes, non-labored Ext: No edema, warm Neuro: Alert and oriented, No gross deficits  Assessment and plan:  #Acute frontal sinusitis Treat with Augmentin Discussed supportive care which she is doing well.  She is used Nasacort and saline at home. She will consider nasal saline rinses   Meds ordered this encounter  Medications  . amoxicillin-clavulanate (AUGMENTIN) 875-125 MG tablet    Sig: Take 1 tablet 2 (two) times daily by mouth.    Dispense:  20 tablet    Refill:  0    Laroy Apple, MD [facility] 08/09/2017, 4:18 PM

## 2017-08-10 ENCOUNTER — Other Ambulatory Visit: Payer: Self-pay | Admitting: Family Medicine

## 2017-08-10 MED ORDER — PREDNISONE 20 MG PO TABS: ORAL_TABLET | ORAL | 0 refills | Status: DC

## 2017-08-10 MED FILL — predniSONE 20 MG TABS: 20 | 5 days supply | Qty: 10 | Fill #0

## 2017-08-10 NOTE — Progress Notes (Signed)
Sent prednisone for back issues

## 2017-08-12 ENCOUNTER — Other Ambulatory Visit: Payer: Self-pay

## 2017-08-12 ENCOUNTER — Ambulatory Visit (AMBULATORY_SURGERY_CENTER): Payer: Self-pay

## 2017-08-12 VITALS — Ht 67.5 in | Wt 241.0 lb

## 2017-08-12 DIAGNOSIS — Z86010 Personal history of colonic polyps: Secondary | ICD-10-CM

## 2017-08-12 MED ORDER — NA SULFATE-K SULFATE-MG SULF 17.5-3.13-1.6 GM/177ML PO SOLN: 1.0000 | Freq: Once | ORAL | 0 refills | Status: AC

## 2017-08-12 MED FILL — SUPREP BOWEL PREP KIT: 17.5-3.13-1 | 2 days supply | Qty: 354 | Fill #0

## 2017-08-12 NOTE — Progress Notes (Signed)
Denies allergies to eggs or soy products. Denies complication of anesthesia or sedation. Denies use of weight loss medication. Denies use of O2.   Emmi instructions declined.  

## 2017-08-14 DIAGNOSIS — J329 Chronic sinusitis, unspecified: Secondary | ICD-10-CM

## 2017-08-14 HISTORY — DX: Chronic sinusitis, unspecified: J32.9

## 2017-08-16 ENCOUNTER — Encounter: Payer: Self-pay | Admitting: Internal Medicine

## 2017-08-24 ENCOUNTER — Encounter: Payer: Self-pay | Admitting: Internal Medicine

## 2017-08-24 ENCOUNTER — Ambulatory Visit (AMBULATORY_SURGERY_CENTER): Payer: 59 | Admitting: Internal Medicine

## 2017-08-24 ENCOUNTER — Other Ambulatory Visit: Payer: Self-pay

## 2017-08-24 VITALS — BP 148/78 | HR 74 | Temp 98.2°F | Resp 11 | Ht 67.5 in | Wt 241.0 lb

## 2017-08-24 DIAGNOSIS — E119 Type 2 diabetes mellitus without complications: Secondary | ICD-10-CM | POA: Diagnosis not present

## 2017-08-24 DIAGNOSIS — E669 Obesity, unspecified: Secondary | ICD-10-CM | POA: Diagnosis not present

## 2017-08-24 DIAGNOSIS — Z86010 Personal history of colonic polyps: Secondary | ICD-10-CM

## 2017-08-24 DIAGNOSIS — I10 Essential (primary) hypertension: Secondary | ICD-10-CM | POA: Diagnosis not present

## 2017-08-24 DIAGNOSIS — D120 Benign neoplasm of cecum: Secondary | ICD-10-CM | POA: Diagnosis not present

## 2017-08-24 DIAGNOSIS — D123 Benign neoplasm of transverse colon: Secondary | ICD-10-CM | POA: Diagnosis not present

## 2017-08-24 DIAGNOSIS — K635 Polyp of colon: Secondary | ICD-10-CM | POA: Diagnosis not present

## 2017-08-24 MED ORDER — SODIUM CHLORIDE 0.9 % IV SOLN: 500.0000 mL | INTRAVENOUS | Status: DC

## 2017-08-24 NOTE — Progress Notes (Signed)
To recovery, report to RN, VSS. 

## 2017-08-24 NOTE — Patient Instructions (Signed)
YOU HAD AN ENDOSCOPIC PROCEDURE TODAY AT THE [facility]:   Refer to the procedure report that was given to you for any specific questions about what was found during the examination.  If the procedure report does not answer your questions, please call your gastroenterologist to clarify.  If you requested that your care partner not be given the details of your procedure findings, then the procedure report has been included in a sealed envelope for you to review at your convenience later.  YOU SHOULD EXPECT: Some feelings of bloating in the abdomen. Passage of more gas than usual.  Walking can help get rid of the air that was put into your GI tract during the procedure and reduce the bloating. If you had a lower endoscopy (such as a colonoscopy or flexible sigmoidoscopy) you may notice spotting of blood in your stool or on the toilet paper. If you underwent a bowel prep for your procedure, you may not have a normal bowel movement for a few days.  Please Note:  You might notice some irritation and congestion in your nose or some drainage.  This is from the oxygen used during your procedure.  There is no need for concern and it should clear up in a day or so.  SYMPTOMS TO REPORT IMMEDIATELY:   Following lower endoscopy (colonoscopy or flexible sigmoidoscopy):  Excessive amounts of blood in the stool  Significant tenderness or worsening of abdominal pains  Swelling of the abdomen that is new, acute  Fever of 100F or higher   For urgent or emergent issues, a gastroenterologist can be reached at any hour by calling [phone].   DIET:  We do recommend a small meal at first, but then you may proceed to your regular diet.  Drink plenty of fluids but you should avoid alcoholic beverages for 24 hours.  ACTIVITY:  You should plan to take it easy for the rest of today and you should NOT DRIVE or use heavy machinery until tomorrow (because of the sedation medicines used during the test).     FOLLOW UP: Our staff will call the number listed on your records the next business day following your procedure to check on you and address any questions or concerns that you may have regarding the information given to you following your procedure. If we do not reach you, we will leave a message.  However, if you are feeling well and you are not experiencing any problems, there is no need to return our call.  We will assume that you have returned to your regular daily activities without incident.  If any biopsies were taken you will be contacted by phone or by letter within the next 1-3 weeks.  Please call us at [phone] if you have not heard about the biopsies in 3 weeks.    SIGNATURES/CONFIDENTIALITY: You and/or your care partner have signed paperwork which will be entered into your electronic medical record.  These signatures attest to the fact that that the information above on your After Visit Summary has been reviewed and is understood.  Full responsibility of the confidentiality of this discharge information lies with you and/or your care-partner.  Read all of the handouts given to you  By your recovery room nurse. 

## 2017-08-24 NOTE — Op Note (Signed)
[facility] Patient Name: [name] Procedure Date: 08/24/2017 9:38 AM MRN: [id number] Endoscopist: Jerene Bears , MD Age: 56 Referring MD:  Date of Birth: October 11, 1960 Gender: Female Account #: [id number] Procedure:                Colonoscopy Indications:              Surveillance: Personal history of adenomatous                            polyps on last colonoscopy 5 years ago Medicines:                Monitored Anesthesia Care Procedure:                Pre-Anesthesia Assessment:                           - Prior to the procedure, a History and Physical                            was performed, and patient medications and                            allergies were reviewed. The patient's tolerance of                            previous anesthesia was also reviewed. The risks                            and benefits of the procedure and the sedation                            options and risks were discussed with the patient.                            All questions were answered, and informed consent                            was obtained. Prior Anticoagulants: The patient has                            taken no previous anticoagulant or antiplatelet                            agents. ASA Grade Assessment: II - A patient with                            mild systemic disease. After reviewing the risks                            and benefits, the patient was deemed in                            satisfactory condition to undergo the procedure.  After obtaining informed consent, the colonoscope                            was passed under direct vision. Throughout the                            procedure, the patient's blood pressure, pulse, and                            oxygen saturations were monitored continuously. The                            Colonoscope was introduced through the anus and                            advanced to the the cecum,  identified by                            appendiceal orifice and ileocecal valve. The                            colonoscopy was performed without difficulty. The                            patient tolerated the procedure well. The quality                            of the bowel preparation was good. The ileocecal                            valve, appendiceal orifice, and rectum were                            photographed. Scope In: 9:49:00 AM Scope Out: 10:05:58 AM Scope Withdrawal Time: 0 hours 12 minutes 25 seconds  Total Procedure Duration: 0 hours 16 minutes 58 seconds  Findings:                 The digital rectal exam was normal.                           A 9 mm polyp was found in the cecum. The polyp was                            sessile. The polyp was removed with a cold snare.                            Resection and retrieval were complete.                           A 5 mm polyp was found in the hepatic flexure. The                            polyp was sessile. The polyp was removed  with a                            cold snare. Resection and retrieval were complete.                           Internal hemorrhoids were found during                            retroflexion. The hemorrhoids were small. Complications:            No immediate complications. Estimated Blood Loss:     Estimated blood loss was minimal. Impression:               - One 9 mm polyp in the cecum, removed with a cold                            snare. Resected and retrieved.                           - One 5 mm polyp at the hepatic flexure, removed                            with a cold snare. Resected and retrieved.                           - Internal hemorrhoids. Recommendation:           - Patient has a contact number available for                            emergencies. The signs and symptoms of potential                            delayed complications were discussed with the                             patient. Return to normal activities tomorrow.                            Written discharge instructions were provided to the                            patient.                           - Resume previous diet.                           - Continue present medications.                           - Await pathology results.                           - Repeat colonoscopy is recommended for  surveillance. The colonoscopy date will be                            determined after pathology results from today's                            exam become available for review. Jerene Bears, MD 08/24/2017 10:11:19 AM This report has been signed electronically.

## 2017-08-24 NOTE — Progress Notes (Signed)
Called to room to assist during endoscopic procedure.  Patient ID and intended procedure confirmed with present staff. Received instructions for my participation in the procedure from the performing physician.  

## 2017-08-29 ENCOUNTER — Telehealth: Payer: Self-pay | Admitting: Family Medicine

## 2017-08-29 ENCOUNTER — Telehealth: Payer: Self-pay | Admitting: *Deleted

## 2017-08-29 ENCOUNTER — Encounter: Payer: Self-pay | Admitting: Internal Medicine

## 2017-08-29 MED ORDER — HYDROCORTISONE ACETATE 25 MG RE SUPP
25.0000 mg | Freq: Two times a day (BID) | RECTAL | 0 refills | Status: DC
Start: 2017-08-29 — End: 2021-05-25

## 2017-08-29 MED FILL — HYDROCORTISONE AC 25 MG SUP: 25 | 6 days supply | Qty: 12 | Fill #0

## 2017-08-29 NOTE — Telephone Encounter (Signed)
Pt had colonoscopy Wednesday and she has internal hemorrhoids. The provider stated her PCP could call in rx Anusol suppositories. Please call into [facility].

## 2017-08-29 NOTE — Telephone Encounter (Signed)
  Follow up Call-  Call back number 08/24/2017  Post procedure Call Back phone  # [phone]  Permission to leave phone message Yes  Some recent data might be hidden     Patient questions:  Do you have a fever, pain , or abdominal swelling? No. Pain Score  0 *  Have you tolerated food without any problems? Yes.    Have you been able to return to your normal activities? Yes.    Do you have any questions about your discharge instructions: Diet   No. Medications  No. Follow up visit  No.  Do you have questions or concerns about your Care? No.  Actions: * If pain score is 4 or above: No action needed, pain <4.

## 2017-08-29 NOTE — Telephone Encounter (Signed)
Left message on f/u call 

## 2017-08-29 NOTE — Telephone Encounter (Signed)
Prescription sent to pharmacy.

## 2017-09-05 ENCOUNTER — Encounter: Payer: Self-pay | Admitting: Family Medicine

## 2017-09-05 DIAGNOSIS — E119 Type 2 diabetes mellitus without complications: Secondary | ICD-10-CM | POA: Diagnosis not present

## 2017-09-05 DIAGNOSIS — I10 Essential (primary) hypertension: Secondary | ICD-10-CM | POA: Diagnosis not present

## 2017-09-05 DIAGNOSIS — Z7984 Long term (current) use of oral hypoglycemic drugs: Secondary | ICD-10-CM | POA: Diagnosis not present

## 2017-09-05 DIAGNOSIS — H11159 Pinguecula, unspecified eye: Secondary | ICD-10-CM | POA: Diagnosis not present

## 2017-09-05 DIAGNOSIS — H524 Presbyopia: Secondary | ICD-10-CM | POA: Diagnosis not present

## 2017-09-05 DIAGNOSIS — H52223 Regular astigmatism, bilateral: Secondary | ICD-10-CM | POA: Diagnosis not present

## 2017-09-05 DIAGNOSIS — H5213 Myopia, bilateral: Secondary | ICD-10-CM | POA: Diagnosis not present

## 2017-09-05 LAB — HM DIABETES EYE EXAM

## 2017-09-12 ENCOUNTER — Other Ambulatory Visit: Payer: Self-pay | Admitting: Family Medicine

## 2017-09-12 ENCOUNTER — Ambulatory Visit: Payer: 59 | Admitting: Family Medicine

## 2017-09-14 MED FILL — RAMIPRIL 10 MG CAPSULE: 10 | 90 days supply | Qty: 180 | Fill #0

## 2017-09-14 MED FILL — metFORMIN HCL 500 MG TABS: 500 | 90 days supply | Qty: 180 | Fill #0

## 2017-09-14 MED FILL — FUROSEMIDE 40 MG TABS: 40 | 90 days supply | Qty: 90 | Fill #0

## 2017-09-20 ENCOUNTER — Other Ambulatory Visit: Payer: Self-pay | Admitting: Family Medicine

## 2017-09-20 DIAGNOSIS — M25562 Pain in left knee: Secondary | ICD-10-CM

## 2017-09-20 DIAGNOSIS — M25552 Pain in left hip: Secondary | ICD-10-CM

## 2017-09-28 ENCOUNTER — Ambulatory Visit (HOSPITAL_COMMUNITY)
Admission: RE | Admit: 2017-09-28 | Discharge: 2017-09-28 | Disposition: A | Discharge status: Home / Self Care | Payer: 59 | Source: Ambulatory Visit | Attending: Family Medicine | Admitting: Family Medicine

## 2017-09-28 ENCOUNTER — Encounter (HOSPITAL_COMMUNITY): Payer: Self-pay

## 2017-09-28 DIAGNOSIS — M25462 Effusion, left knee: Secondary | ICD-10-CM | POA: Insufficient documentation

## 2017-09-28 DIAGNOSIS — X58XXXA Exposure to other specified factors, initial encounter: Secondary | ICD-10-CM | POA: Insufficient documentation

## 2017-09-28 DIAGNOSIS — M7122 Synovial cyst of popliteal space [Baker], left knee: Secondary | ICD-10-CM | POA: Diagnosis not present

## 2017-09-28 DIAGNOSIS — M25562 Pain in left knee: Secondary | ICD-10-CM | POA: Diagnosis not present

## 2017-09-28 DIAGNOSIS — S83232A Complex tear of medial meniscus, current injury, left knee, initial encounter: Secondary | ICD-10-CM | POA: Diagnosis not present

## 2017-09-28 DIAGNOSIS — M25552 Pain in left hip: Secondary | ICD-10-CM

## 2017-09-29 ENCOUNTER — Other Ambulatory Visit: Payer: Self-pay | Admitting: Family Medicine

## 2017-09-29 DIAGNOSIS — M25552 Pain in left hip: Secondary | ICD-10-CM

## 2017-09-30 ENCOUNTER — Other Ambulatory Visit: Payer: Self-pay | Admitting: Family Medicine

## 2017-09-30 DIAGNOSIS — K2100 Gastro-esophageal reflux disease with esophagitis, without bleeding: Secondary | ICD-10-CM

## 2017-09-30 MED FILL — raNITIdine HCL 150 MG TABS: 150 | 90 days supply | Qty: 180 | Fill #0

## 2017-10-03 ENCOUNTER — Ambulatory Visit
Admission: RE | Admit: 2017-10-03 | Discharge: 2017-10-03 | Disposition: A | Discharge status: Home / Self Care | Payer: 59 | Source: Ambulatory Visit | Attending: Family Medicine | Admitting: Family Medicine

## 2017-10-03 DIAGNOSIS — M25552 Pain in left hip: Secondary | ICD-10-CM

## 2017-10-03 DIAGNOSIS — M1612 Unilateral primary osteoarthritis, left hip: Secondary | ICD-10-CM | POA: Diagnosis not present

## 2017-10-24 DIAGNOSIS — M25552 Pain in left hip: Secondary | ICD-10-CM | POA: Insufficient documentation

## 2017-10-30 DIAGNOSIS — M7060 Trochanteric bursitis, unspecified hip: Secondary | ICD-10-CM | POA: Insufficient documentation

## 2017-10-30 DIAGNOSIS — M179 Osteoarthritis of knee, unspecified: Secondary | ICD-10-CM | POA: Insufficient documentation

## 2017-10-30 DIAGNOSIS — M1710 Unilateral primary osteoarthritis, unspecified knee: Secondary | ICD-10-CM | POA: Insufficient documentation

## 2017-11-07 ENCOUNTER — Other Ambulatory Visit: Payer: Self-pay | Admitting: *Deleted

## 2017-11-07 ENCOUNTER — Encounter: Payer: Self-pay | Admitting: Nurse Practitioner

## 2017-11-07 ENCOUNTER — Ambulatory Visit (INDEPENDENT_AMBULATORY_CARE_PROVIDER_SITE_OTHER): Payer: No Typology Code available for payment source | Admitting: Nurse Practitioner

## 2017-11-07 VITALS — BP 131/73 | HR 75 | Temp 97.0°F | Ht 67.0 in | Wt 233.0 lb

## 2017-11-07 DIAGNOSIS — J0101 Acute recurrent maxillary sinusitis: Secondary | ICD-10-CM

## 2017-11-07 DIAGNOSIS — R399 Unspecified symptoms and signs involving the genitourinary system: Secondary | ICD-10-CM

## 2017-11-07 DIAGNOSIS — R3915 Urgency of urination: Secondary | ICD-10-CM | POA: Diagnosis not present

## 2017-11-07 LAB — MICROSCOPIC EXAMINATION
BACTERIA UA: NONE SEEN
RENAL EPITHEL UA: NONE SEEN /HPF

## 2017-11-07 LAB — URINALYSIS, COMPLETE
Bilirubin, UA: NEGATIVE
Glucose, UA: NEGATIVE
KETONES UA: NEGATIVE
LEUKOCYTES UA: NEGATIVE
NITRITE UA: NEGATIVE
PH UA: 6 (ref 5.0–7.5)
Protein, UA: NEGATIVE
RBC, UA: NEGATIVE
SPEC GRAV UA: 1.015 (ref 1.005–1.030)
Urobilinogen, Ur: 0.2 mg/dL (ref 0.2–1.0)

## 2017-11-07 MED ORDER — LEVOFLOXACIN 500 MG PO TABS: 500.0000 mg | ORAL_TABLET | Freq: Every day | ORAL | 0 refills | Status: DC

## 2017-11-07 NOTE — Addendum Note (Signed)
Addended byCarrolyn Leigh on: 11/07/2017 12:03 PM   Modules accepted: Orders

## 2017-11-07 NOTE — Patient Instructions (Signed)

## 2017-11-07 NOTE — Progress Notes (Signed)
Subjective:    Patient ID: [name], female    DOB: 1961/09/30, 57 y.o.   MRN: [id number]  HPI Patient comes in today with c/o: - lower abd pain and back pain- started yesterday afternoon. Does have urinary freq and urgency with scant amount of urine. - sinus pressure- started 3 days ago. No fever, slight cough.    Review of Systems  Constitutional: Negative for chills and fever.  HENT: Positive for congestion, sinus pressure and sinus pain. Negative for trouble swallowing.   Respiratory: Negative for cough and shortness of breath.   Cardiovascular: Negative.   Gastrointestinal: Positive for abdominal pain.  Genitourinary: Positive for frequency, pelvic pain and urgency.  Neurological: Negative.   Psychiatric/Behavioral: Negative.   All other systems reviewed and are negative.      Objective:   Physical Exam  Constitutional: She is oriented to person, place, and time. She appears well-developed and well-nourished. No distress.  HENT:  Right Ear: Hearing, tympanic membrane, external ear and ear canal normal.  Left Ear: Hearing, tympanic membrane, external ear and ear canal normal.  Nose: Mucosal edema and rhinorrhea present. Right sinus exhibits maxillary sinus tenderness. Right sinus exhibits no frontal sinus tenderness. Left sinus exhibits maxillary sinus tenderness. Left sinus exhibits no frontal sinus tenderness.  Mouth/Throat: Uvula is midline, oropharynx is clear and moist and mucous membranes are normal.  Eyes: Conjunctivae are normal. Pupils are equal, round, and reactive to light.  Neck: Normal range of motion. Neck supple.  Cardiovascular: Normal rate and regular rhythm.  Pulmonary/Chest: Effort normal and breath sounds normal.  Abdominal: There is no tenderness.  Genitourinary:  Genitourinary Comments: No cva tenderness  Lymphadenopathy:    She has no cervical adenopathy.  Neurological: She is alert and oriented to person, place, and time.  Skin: Skin is  warm.  Psychiatric: She has a normal mood and affect. Her behavior is normal. Judgment and thought content normal.   BP 131/73   Pulse 75   Temp (!) 97 F (36.1 C) (Oral)   Ht 5\' 7"  (1.702 m)   Wt 233 lb (105.7 kg)   LMP 12/26/2014 (Approximate) Comment: 2-3 days  BMI 36.49 kg/m         Assessment & Plan:   1. Urinary urgency   2. Acute recurrent maxillary sinusitis    Meds ordered this encounter  Medications  . levofloxacin (LEVAQUIN) 500 MG tablet    Sig: Take 1 tablet (500 mg total) by mouth daily.    Dispense:  7 tablet    Refill:  0    Order Specific Question:   Supervising Provider    Answer:   Eustaquio Maize [[id number]]   Fore fluids Rest 1. Take meds as prescribed 2. Use a cool mist humidifier especially during the winter months and when heat has been humid. 3. Use saline nose sprays frequently 4. Saline irrigations of the nose can be very helpful if done frequently.  * 4X daily for 1 week*  * Use of a nettie pot can be helpful with this. Follow directions with this* 5. Drink plenty of fluids 6. Keep thermostat turn down low 7.For any cough or congestion  Use plain Mucinex- regular strength or max strength is fine   * Children- consult with Pharmacist for dosing 8. For fever or aces or pains- take tylenol or ibuprofen appropriate for age and weight.  * for fevers greater than 101 orally you may alternate ibuprofen and tylenol every  3 hours.  [name]-Margaret Hassell Done, FNP

## 2017-11-23 ENCOUNTER — Other Ambulatory Visit: Payer: Self-pay | Admitting: Internal Medicine

## 2017-11-23 DIAGNOSIS — Z139 Encounter for screening, unspecified: Secondary | ICD-10-CM

## 2017-11-30 ENCOUNTER — Ambulatory Visit (INDEPENDENT_AMBULATORY_CARE_PROVIDER_SITE_OTHER): Payer: No Typology Code available for payment source | Admitting: Family Medicine

## 2017-11-30 ENCOUNTER — Encounter: Payer: Self-pay | Admitting: Family Medicine

## 2017-11-30 VITALS — BP 113/65 | HR 85 | Temp 97.8°F | Ht 67.0 in | Wt 235.0 lb

## 2017-11-30 DIAGNOSIS — Z23 Encounter for immunization: Secondary | ICD-10-CM | POA: Diagnosis not present

## 2017-11-30 DIAGNOSIS — E785 Hyperlipidemia, unspecified: Secondary | ICD-10-CM

## 2017-11-30 DIAGNOSIS — E1169 Type 2 diabetes mellitus with other specified complication: Secondary | ICD-10-CM

## 2017-11-30 DIAGNOSIS — E119 Type 2 diabetes mellitus without complications: Secondary | ICD-10-CM

## 2017-11-30 DIAGNOSIS — Z0000 Encounter for general adult medical examination without abnormal findings: Secondary | ICD-10-CM

## 2017-11-30 DIAGNOSIS — M2550 Pain in unspecified joint: Secondary | ICD-10-CM | POA: Insufficient documentation

## 2017-11-30 DIAGNOSIS — Z130 Encounter for screening for diseases of the blood and blood-forming organs and certain disorders involving the immune mechanism: Secondary | ICD-10-CM

## 2017-11-30 NOTE — Progress Notes (Deleted)
[name] is a 57 y.o. female presents to office today for annual physical exam examination.    Concerns today include: 1. ***  Occupation: ***, Marital status: ***, Substance use: *** Diet: ***, Exercise: *** Last eye exam: *** Last dental exam: *** Last colonoscopy: *** Last mammogram: *** Last pap smear: *** Refills needed today: *** Immunizations needed: Flu Vaccine: {YES/NO/WILD YKDXI:33825}  Tdap Vaccine: {YES/NO/WILD KNLZJ:67341}  - every 46yrs - (<3 lifetime doses or unknown): all wounds -- look up need for Tetanus IG - (>=3 lifetime doses): clean/minor wound if >54yrs from previous; all other wounds if >4yrs from previous Zoster Vaccine: {YES/NO/WILD CARDS:18581} (those >50yo, once) Pneumonia Vaccine: {YES/NO/WILD PFXTK:24097} (those w/ risk factors) - (<8yr) Both: Immunocompromised, cochlear implant, CSF leak, asplenic, sickle cell, Chronic Renal Failure - (<20yr) PPSV-23 only: Heart dz, lung disease, DM, tobacco abuse, alcoholism, cirrhosis/liver disease. - (>33yr): PPSV13 then PPSV23 in 6-12mths;  - (>73yr): repeat PPSV23 once if pt received prior to 57yo and 37yrs have passed  Past Medical History:  Diagnosis Date  . Allergy    seasonal  . Anxiety   . Arthritis   . Diabetes mellitus    type ii  . GERD (gastroesophageal reflux disease)   . Hiatal hernia   . Hypertension   . Irritable bowel syndrome   . Obesity   . Sinus infection 08/14/2017   Symptoms resolved after antiobiotic course  . Tubular adenoma of colon 05/27/2015  . Ulcer    Social History   Socioeconomic History  . Marital status: Married    Spouse name: Not on file  . Number of children: Not on file  . Years of education: Not on file  . Highest education level: Not on file  Social Needs  . Financial resource strain: Not on file  . Food insecurity - worry: Not on file  . Food insecurity - inability: Not on file  . Transportation needs - medical: Not on file  . Transportation  needs - non-medical: Not on file  Occupational History  . Not on file  Tobacco Use  . Smoking status: Never Smoker  . Smokeless tobacco: Never Used  . Tobacco comment: AS A Teenager  Substance and Sexual Activity  . Alcohol use: Yes    Alcohol/week: 0.0 oz    Comment:  occasional wine  . Drug use: No  . Sexual activity: Not on file  Other Topics Concern  . Not on file  Social History Narrative  . Not on file   Past Surgical History:  Procedure Laterality Date  . CHOLECYSTECTOMY  2009   Family History  Problem Relation Age of Onset  . Diabetes Mother   . Kidney disease Mother   . Colitis Father   . Colon polyps Father   . Heart disease Father   . Irritable bowel syndrome Father   . Other Unknown        corkscrew esophagus- father, sister  . Crohn's disease Sister   . Colitis Sister   . Colon polyps Sister   . Diabetes Maternal Aunt   . Diabetes Paternal Aunt   . Diabetes Maternal Grandmother   . Diabetes Paternal Grandmother   . Heart disease Paternal Grandmother   . Colitis Paternal Grandfather   . Colon cancer Neg Hx   . Esophageal cancer Neg Hx   . Pancreatic cancer Neg Hx   . Rectal cancer Neg Hx   . Stomach cancer Neg Hx     Current Outpatient Medications:  .  ALPRAZolam (XANAX) 0.5 MG tablet, Take 1 tablet (0.5 mg total) by mouth at bedtime as needed for sleep. (Patient not taking: Reported on 08/24/2017), Disp: 60 tablet, Rfl: 0 .  aspirin EC 81 MG tablet, Take 81 mg by mouth daily., Disp: , Rfl:  .  cetirizine (ZYRTEC) 10 MG tablet, Take 10 mg by mouth daily as needed. , Disp: , Rfl:  .  furosemide (LASIX) 40 MG tablet, TAKE 1 TABLET (40 MG TOTAL) BY MOUTH DAILY., Disp: 90 tablet, Rfl: 1 .  glucose blood (ONETOUCH VERIO) test strip, Check fasting blood sugar 1X a day and prn    Dx 250.01, Disp: 300 each, Rfl: 1 .  hydrocortisone (ANUSOL-HC) 25 MG suppository, Place 1 suppository (25 mg total) rectally 2 (two) times daily., Disp: 12 suppository, Rfl: 0 .   levofloxacin (LEVAQUIN) 500 MG tablet, Take 1 tablet (500 mg total) by mouth daily., Disp: 7 tablet, Rfl: 0 .  metFORMIN (GLUCOPHAGE) 500 MG tablet, TAKE 1 TABLET BY MOUTH TWICE DAILY, Disp: 180 tablet, Rfl: 0 .  Pitavastatin Calcium (LIVALO) 2 MG TABS, Take 1 tablet (2 mg total) by mouth at bedtime. (Patient not taking: Reported on 08/24/2017), Disp: 90 tablet, Rfl: 1 .  ramipril (ALTACE) 10 MG capsule, TAKE 2 CAPSULES BY MOUTH ONCE DAILY., Disp: 180 capsule, Rfl: 1 .  ranitidine (ZANTAC) 150 MG tablet, TAKE 1 TABLET BY MOUTH 2 TIMES DAILY., Disp: 180 tablet, Rfl: 0  Current Facility-Administered Medications:  .  0.9 %  sodium chloride infusion, 500 mL, Intravenous, Continuous, Pyrtle, Lajuan Lines, MD   ROS: Review of Systems {ros; complete:30496}    Physical exam {Exam, Complete:[id number]}    Assessment/ Plan: [name] here for annual physical exam.   No problem-specific Assessment & Plan notes found for this encounter.   Counseled on healthy lifestyle choices, including diet (rich in fruits, vegetables and lean meats and low in salt and simple carbohydrates) and exercise (at least 30 minutes of moderate physical activity daily).  Patient to follow up in 1 year for annual exam or sooner if needed.  [name] Frater M. Lajuana Ripple, DO

## 2017-11-30 NOTE — Patient Instructions (Signed)

## 2017-11-30 NOTE — Progress Notes (Signed)
[name] is a 57 y.o. female presents to office today for annual physical exam examination.    Concerns today include: 1. Spot on right cheek Patient reports that she noticed a spot on her right cheek sometime ago.  She denies spontaneous bleeding, changes in color, size, shape or texture.  No personal or family history of skin cancers.  2. DM2 Diagnosed around age 59.  Reports compliance with Metformin.  She has discontinued Livaolo but is willing to restart statin therapy.  She reports that she has been on Altace 20 mg daily for several years.  Denies intolerance.  She reports she is placed on this days because there is a strong family history of renal disease in her mother.  She recently had her eye exam on 12/3 with Dr. Einar Gip in [place].  She reports there was no retinopathy appreciated.    3. Polyarthralgia Patient notes that she sees Dr. Ricki Rodriguez for left-sided hip and knee pain.  She does have other joints that tend to ache.  Denies swelling or erythema.  She thinks that her mother may have had a history of rheumatoid arthritis and notes that she had deformities of bilateral hands.  She would like to be checked for autoimmune arthritis.  Strong family history of Crohn's and ulcerative colitis.  Substance use: none Diet: fair, Exercise: stationary bike/ walks several times per wek Last eye exam: December, no retinopathy Last colonoscopy: precancerous polyps. Due to have repeat in 5 years Last mammogram: 03/16/2017 Birads 2 Last pap smear: sees Dr Deatra Ina, last 2018 and was benign.  Refills needed today: none Immunizations needed: PNA  Past Medical History:  Diagnosis Date  . Allergy    seasonal  . Anxiety   . Arthritis   . Diabetes mellitus    type ii  . GERD (gastroesophageal reflux disease)   . Hiatal hernia   . Hypertension   . Irritable bowel syndrome   . Obesity   . Sinus infection 08/14/2017   Symptoms resolved after antiobiotic course  . Tubular adenoma of  colon 05/27/2015  . Ulcer    Social History   Socioeconomic History  . Marital status: Married    Spouse name: Not on file  . Number of children: Not on file  . Years of education: Not on file  . Highest education level: Not on file  Social Needs  . Financial resource strain: Not on file  . Food insecurity - worry: Not on file  . Food insecurity - inability: Not on file  . Transportation needs - medical: Not on file  . Transportation needs - non-medical: Not on file  Occupational History  . Not on file  Tobacco Use  . Smoking status: Never Smoker  . Smokeless tobacco: Never Used  . Tobacco comment: AS A Teenager  Substance and Sexual Activity  . Alcohol use: Yes    Alcohol/week: 0.0 oz    Comment:  occasional wine  . Drug use: No  . Sexual activity: Not on file  Other Topics Concern  . Not on file  Social History Narrative  . Not on file   Past Surgical History:  Procedure Laterality Date  . CHOLECYSTECTOMY  2009   Family History  Problem Relation Age of Onset  . Diabetes Mother   . Kidney disease Mother   . Colitis Father   . Colon polyps Father   . Heart disease Father   . Irritable bowel syndrome Father   . Other Unknown  corkscrew esophagus- father, sister  . Crohn's disease Sister   . Colitis Sister   . Colon polyps Sister   . Diabetes Maternal Aunt   . Diabetes Paternal Aunt   . Diabetes Maternal Grandmother   . Diabetes Paternal Grandmother   . Heart disease Paternal Grandmother   . Colitis Paternal Grandfather   . Colon cancer Neg Hx   . Esophageal cancer Neg Hx   . Pancreatic cancer Neg Hx   . Rectal cancer Neg Hx   . Stomach cancer Neg Hx     Current Outpatient Medications:  .  ALPRAZolam (XANAX) 0.5 MG tablet, Take 1 tablet (0.5 mg total) by mouth at bedtime as needed for sleep. (Patient not taking: Reported on 08/24/2017), Disp: 60 tablet, Rfl: 0 .  aspirin EC 81 MG tablet, Take 81 mg by mouth daily., Disp: , Rfl:  .  cetirizine  (ZYRTEC) 10 MG tablet, Take 10 mg by mouth daily as needed. , Disp: , Rfl:  .  furosemide (LASIX) 40 MG tablet, TAKE 1 TABLET (40 MG TOTAL) BY MOUTH DAILY., Disp: 90 tablet, Rfl: 1 .  glucose blood (ONETOUCH VERIO) test strip, Check fasting blood sugar 1X a day and prn    Dx 250.01, Disp: 300 each, Rfl: 1 .  hydrocortisone (ANUSOL-HC) 25 MG suppository, Place 1 suppository (25 mg total) rectally 2 (two) times daily., Disp: 12 suppository, Rfl: 0 .  levofloxacin (LEVAQUIN) 500 MG tablet, Take 1 tablet (500 mg total) by mouth daily., Disp: 7 tablet, Rfl: 0 .  metFORMIN (GLUCOPHAGE) 500 MG tablet, TAKE 1 TABLET BY MOUTH TWICE DAILY, Disp: 180 tablet, Rfl: 0 .  Pitavastatin Calcium (LIVALO) 2 MG TABS, Take 1 tablet (2 mg total) by mouth at bedtime. (Patient not taking: Reported on 08/24/2017), Disp: 90 tablet, Rfl: 1 .  ramipril (ALTACE) 10 MG capsule, TAKE 2 CAPSULES BY MOUTH ONCE DAILY., Disp: 180 capsule, Rfl: 1 .  ranitidine (ZANTAC) 150 MG tablet, TAKE 1 TABLET BY MOUTH 2 TIMES DAILY., Disp: 180 tablet, Rfl: 0  Current Facility-Administered Medications:  .  0.9 %  sodium chloride infusion, 500 mL, Intravenous, Continuous, Pyrtle, Lajuan Lines, MD   ROS: Review of Systems Constitutional: negative Eyes: negative Ears, nose, mouth, throat, and face: negative, recently had a sore throat that is resolving Respiratory: negative Cardiovascular: negative Gastrointestinal: positive for constipation Genitourinary:negative Integument/breast: negative Hematologic/lymphatic: positive for small skin mole on right cheek as above Musculoskeletal:negative Neurological: negative Behavioral/Psych: positive for anxiety with flying.  Has Xanax prn for this.   Endocrine: negative Allergic/Immunologic: negative    Physical exam BP 113/65   Pulse 85   Temp 97.8 F (36.6 C) (Oral)   Ht _0  (1.702 m)   Wt 235 lb (106.6 kg)   LMP 12/26/2014 (Approximate) Comment: 2-3 days  BMI 36.81 kg/m  General  appearance: alert, cooperative, appears stated age and no distress Head: Normocephalic, without obvious abnormality, atraumatic Eyes: negative findings: lids and lashes normal, conjunctivae and sclerae normal, corneas clear and pupils equal, round, reactive to light and accomodation Ears: normal TM's and external ear canals both ears Nose: Nares normal. Septum midline. Mucosa normal. No drainage or sinus tenderness. Throat: lips, mucosa, and tongue normal; teeth and gums normal and mild erythema of oropharynx Neck: no adenopathy, no carotid bruit, no JVD, supple, symmetrical, trachea midline and thyroid not enlarged, symmetric, no tenderness/mass/nodules Back: symmetric, no curvature. ROM normal. No CVA tenderness. Lungs: clear to auscultation bilaterally Heart: regular rate and rhythm, S1, S2 normal,  no murmur, click, rub or gallop Abdomen: soft, non-tender; bowel sounds normal; no masses,  no organomegaly Extremities: extremities normal, atraumatic, no cyanosis or edema Pulses: 2+ and symmetric Skin: Skin color, texture, turgor normal. No rashes or lesions; she has multiple nevi that appear benign on bilateral upper extremities.  There is about a quarter of an inch by quarter of an inch skin lesion that is consistent with a seborrheic keratosis in the left upper extremity.  Her right cheek just below the right eye has a small, flesh-colored papule that appears benign. Lymph nodes: Cervical, supraclavicular, and axillary nodes normal. Neurologic: Grossly normal    Diabetic Foot Form - Detailed   Diabetic Foot Exam - detailed Diabetic Foot exam was performed with the following findings:  Yes 11/30/2017 12:36 PM  Visual Foot Exam completed.:  Yes  Can the patient see the bottom of their feet?:  Yes Are the shoes appropriate in style and fit?:  Yes Is there swelling or and abnormal foot shape?:  No Is there a claw toe deformity?:  No Is there elevated skin temparature?:  No Is there foot or  ankle muscle weakness?:  No Normal Range of Motion:  Yes Pulse Foot Exam completed.:  Yes  Right posterior Tibialias:  Present Left posterior Tibialias:  Present  Right Dorsalis Pedis:  Present Left Dorsalis Pedis:  Present  Sensory Foot Exam Completed.:  Yes Semmes-Weinstein Monofilament Test R Site 1-Great Toe:  Pos L Site 1-Great Toe:  Pos        Assessment/ Plan: [name] here for annual physical exam.   1. Annual physical exam She is up-to-date on her diabetic eye exam, mammography and colon cancer screening.  She was given her pneumonia shot today.  2. Controlled type 2 diabetes mellitus without complication, without long-term current use of insulin (HCC) Check Y5X, basic metabolic labs.  Diabetic foot exam was performed today and was within normal limits.  No evidence of neuropathy or ulceration. - CMP14+EGFR - Lipid Panel - Bayer DCA Hb A1c Waived  3. Type 2 diabetes mellitus with hyperlipidemia ([place]) Has not been on oral statin since January.  She is willing to start this back.  Will check lipid panel. - CMP14+EGFR - Lipid Panel  4. Screening for deficiency anemia - CBC with Differential  5. Polyarthralgia - Arthritis Panel   Counseled on healthy lifestyle choices, including diet (rich in fruits, vegetables and lean meats and low in salt and simple carbohydrates) and exercise (at least 30 minutes of moderate physical activity daily).  Patient to follow up in 1 year for annual exam or sooner if needed.  Ashly M. Lajuana Ripple, DO

## 2017-12-07 ENCOUNTER — Ambulatory Visit (INDEPENDENT_AMBULATORY_CARE_PROVIDER_SITE_OTHER): Payer: No Typology Code available for payment source | Admitting: Family Medicine

## 2017-12-07 ENCOUNTER — Encounter: Payer: Self-pay | Admitting: Family Medicine

## 2017-12-07 VITALS — BP 138/77 | HR 91 | Temp 97.2°F | Ht 67.0 in | Wt 237.0 lb

## 2017-12-07 DIAGNOSIS — R040 Epistaxis: Secondary | ICD-10-CM

## 2017-12-07 DIAGNOSIS — J0101 Acute recurrent maxillary sinusitis: Secondary | ICD-10-CM

## 2017-12-07 MED ORDER — AMOXICILLIN-POT CLAVULANATE 875-125 MG PO TABS: 1.0000 | ORAL_TABLET | Freq: Two times a day (BID) | ORAL | 0 refills | Status: DC

## 2017-12-07 NOTE — Progress Notes (Signed)
Subjective: CC: nose bleeds PCP: [name], DO [name] is a 57 y.o. female presenting to clinic today for:  1. Nose bleeds Patient reports that she has been having daily nosebleeds for the last 3 months.  She reports that bleeding typically does not last longer than a couple of minutes and usually is relieved with pressure.  She does report passing large clots occasionally.  She was previously using nasal saline and nasal gel but when she was seen at the beginning of February and for sinus infection the provider thought that this may be causing things to be worse and recommend that she use bacitracin applied.  She notes that the bacitracin did seem to help relieve frequent nosebleeds but that she discontinued using it.  She does report mechanically stimulating the nose often, frequently blowing.  She uses a fan at nighttime, which she thinks may be also adding to the problem.  She does have an ear nose and throat doctor, Dr. Constance Holster, and she plans on scheduling an appointment with him soon.  He has not seen her for this issue before.  Additionally, she notes that she thinks she may be developing another sinus infection.  She reports that yesterday she started having facial pain, particularly over the maxillary sinuses, dental pain and sinus pressure.  Again, she was treated for a sinusitis in February '19, November '18, May '18.  Denies fevers, chills, nausea, vomiting.  Therapies as above.  ROS: Per HPI  Allergies  Allergen Reactions  . Erythromycin Other (See Comments)    Abdominal pain  . Codeine Other (See Comments)    Hyper   . Sulfa Antibiotics Hives  . Tetracycline Rash   Past Medical History:  Diagnosis Date  . Allergy    seasonal  . Anxiety   . Arthritis   . Diabetes mellitus    type ii  . GERD (gastroesophageal reflux disease)   . Hiatal hernia   . Hypertension   . Irritable bowel syndrome   . Obesity   . Sinus infection 08/14/2017   Symptoms  resolved after antiobiotic course  . Tubular adenoma of colon 05/27/2015  . Ulcer     Current Outpatient Medications:  .  ALPRAZolam (XANAX) 0.5 MG tablet, Take 1 tablet (0.5 mg total) by mouth at bedtime as needed for sleep. (Patient not taking: Reported on 08/24/2017), Disp: 60 tablet, Rfl: 0 .  amoxicillin-clavulanate (AUGMENTIN) 875-125 MG tablet, Take 1 tablet by mouth 2 (two) times daily., Disp: 20 tablet, Rfl: 0 .  aspirin EC 81 MG tablet, Take 81 mg by mouth daily., Disp: , Rfl:  .  furosemide (LASIX) 40 MG tablet, TAKE 1 TABLET (40 MG TOTAL) BY MOUTH DAILY., Disp: 90 tablet, Rfl: 1 .  glucose blood (ONETOUCH VERIO) test strip, Check fasting blood sugar 1X a day and prn    Dx 250.01, Disp: 300 each, Rfl: 1 .  hydrocortisone (ANUSOL-HC) 25 MG suppository, Place 1 suppository (25 mg total) rectally 2 (two) times daily. (Patient not taking: Reported on 11/30/2017), Disp: 12 suppository, Rfl: 0 .  metFORMIN (GLUCOPHAGE) 500 MG tablet, TAKE 1 TABLET BY MOUTH TWICE DAILY, Disp: 180 tablet, Rfl: 0 .  Pitavastatin Calcium (LIVALO) 2 MG TABS, Take 2 tablets by mouth at bedtime., Disp: , Rfl:  .  ramipril (ALTACE) 10 MG capsule, TAKE 2 CAPSULES BY MOUTH ONCE DAILY., Disp: 180 capsule, Rfl: 1 .  ranitidine (ZANTAC) 150 MG tablet, TAKE 1 TABLET BY MOUTH 2 TIMES DAILY., Disp:  180 tablet, Rfl: 0  Current Facility-Administered Medications:  .  0.9 %  sodium chloride infusion, 500 mL, Intravenous, Continuous, Pyrtle, Lajuan Lines, MD Social History   Socioeconomic History  . Marital status: Married    Spouse name: Not on file  . Number of children: Not on file  . Years of education: Not on file  . Highest education level: Not on file  Social Needs  . Financial resource strain: Not on file  . Food insecurity - worry: Not on file  . Food insecurity - inability: Not on file  . Transportation needs - medical: Not on file  . Transportation needs - non-medical: Not on file  Occupational History  . Not on  file  Tobacco Use  . Smoking status: Never Smoker  . Smokeless tobacco: Never Used  . Tobacco comment: AS A Teenager  Substance and Sexual Activity  . Alcohol use: Yes    Alcohol/week: 0.0 oz    Comment:  occasional wine  . Drug use: No  . Sexual activity: Not on file  Other Topics Concern  . Not on file  Social History Narrative  . Not on file   Family History  Problem Relation Age of Onset  . Diabetes Mother   . Kidney disease Mother   . Colitis Father   . Colon polyps Father   . Heart disease Father   . Irritable bowel syndrome Father   . Other Unknown        corkscrew esophagus- father, sister  . Crohn's disease Sister   . Colitis Sister   . Colon polyps Sister   . Diabetes Maternal Aunt   . Diabetes Paternal Aunt   . Diabetes Maternal Grandmother   . Diabetes Paternal Grandmother   . Heart disease Paternal Grandmother   . Colitis Paternal Grandfather   . Colon cancer Neg Hx   . Esophageal cancer Neg Hx   . Pancreatic cancer Neg Hx   . Rectal cancer Neg Hx   . Stomach cancer Neg Hx     Objective: Office vital signs reviewed. BP 138/77   Pulse 91   Temp (!) 97.2 F (36.2 C) (Oral)   Ht 5\' 7"  (1.702 m)   Wt 237 lb (107.5 kg)   LMP 12/26/2014 (Approximate) Comment: 2-3 days  BMI 37.12 kg/m   Physical Examination:  General: Awake, alert, well nourished, nonotoxic, No acute distress HEENT: +frontal and maxillary sinus TTP    Neck: No masses palpated. No lymphadenopathy    Ears: Left tympanic membrane intact, normal light reflex, no erythema, no bulging; right tympanic membrane with dulled light reflex and crepey texture.  No erythema.  No appreciable purulence behind TM.    Eyes: extraocular membranes intact, sclera white, no conjunctival pallor    Nose: nasal turbinates moist, erythematous and edematous with clear mixed with bloody nasal discharge; there is a hemostatic bleed appreciated in the left nare along the medial aspect.  Deviated septum noted.     Throat: moist mucus membranes, moderate oropharyngeal erythema, grade 2 tonsils no tonsillar exudate.  Airway is patent Cardio: regular rate and rhythm, S1S2 heard, no murmurs appreciated Pulm: clear to auscultation bilaterally, no wheezes, rhonchi or rales; normal work of breathing on room air Neuro: Alert, oriented, follows commands.  No focal deficits.  Assessment/ Plan: 57 y.o. female   1. Recurrent epistaxis I do think that patient needs to see her ear nose and throat doctor for recurrent epistaxis.  She does not demonstrate any signs or  symptoms that suggest significant anemia secondary to frequent but short duration nosebleeding.  CBC was actually ordered during her last physical last week and she plans on getting this done.  We discussed consideration for using the saline gel only applied to specifically the left nare since this seems to be the one that bleeds more.  I recommended humidification.  She understands what would be considered prolonged nasal bleeding.  She may need cautery.  Avoid mechanically irritating nares.  2. Recurrent maxillary sinusitis Patient is afebrile and nontoxic-appearing.  She was actually just treated with a fluoroquinolone 1 month ago.  Will treat with Augmentin p.o. twice daily for the next 10 days.  She will schedule an appointment with Dr. Constance Holster, ENT for recurrent sinusitis and recurrent epistaxis.  Follow-up as needed.  Meds ordered this encounter  Medications  . amoxicillin-clavulanate (AUGMENTIN) 875-125 MG tablet    Sig: Take 1 tablet by mouth 2 (two) times daily.    Dispense:  20 tablet    Refill:  [place], DO [place] [phone]

## 2017-12-14 ENCOUNTER — Other Ambulatory Visit: Payer: No Typology Code available for payment source

## 2017-12-14 LAB — BAYER DCA HB A1C WAIVED: HB A1C (BAYER DCA - WAIVED): 6 % (ref <7.0)

## 2017-12-15 LAB — CMP14+EGFR
ALK PHOS: 55 IU/L (ref 39–117)
ALT: 26 IU/L (ref 0–32)
AST: 18 IU/L (ref 0–40)
Albumin/Globulin Ratio: 1.8 (ref 1.2–2.2)
Albumin: 4.4 g/dL (ref 3.5–5.5)
BUN/Creatinine Ratio: 14 (ref 9–23)
BUN: 10 mg/dL (ref 6–24)
Bilirubin Total: 0.2 mg/dL (ref 0.0–1.2)
CO2: 26 mmol/L (ref 20–29)
CREATININE: 0.7 mg/dL (ref 0.57–1.00)
Calcium: 9.6 mg/dL (ref 8.7–10.2)
Chloride: 99 mmol/L (ref 96–106)
GFR calc Af Amer: 113 mL/min/{1.73_m2} (ref >59)
GFR calc non Af Amer: 98 mL/min/{1.73_m2} (ref >59)
Globulin, Total: 2.5 g/dL (ref 1.5–4.5)
Glucose: 124 mg/dL — ABNORMAL HIGH (ref 65–99)
Potassium: 3.9 mmol/L (ref 3.5–5.2)
Sodium: 140 mmol/L (ref 134–144)
Total Protein: 6.9 g/dL (ref 6.0–8.5)

## 2017-12-15 LAB — ARTHRITIS PANEL
BASOS: 1 %
Basophils Absolute: 0 10*3/uL (ref 0.0–0.2)
EOS (ABSOLUTE): 0.3 10*3/uL (ref 0.0–0.4)
Eos: 3 %
HEMOGLOBIN: 13.3 g/dL (ref 11.1–15.9)
Hematocrit: 40.7 % (ref 34.0–46.6)
IMMATURE GRANS (ABS): 0 10*3/uL (ref 0.0–0.1)
Immature Granulocytes: 0 %
LYMPHS: 32 %
Lymphocytes Absolute: 2.6 10*3/uL (ref 0.7–3.1)
MCH: 29.2 pg (ref 26.6–33.0)
MCHC: 32.7 g/dL (ref 31.5–35.7)
MCV: 90 fL (ref 79–97)
MONOCYTES: 6 %
Monocytes Absolute: 0.5 10*3/uL (ref 0.1–0.9)
NEUTROS ABS: 4.9 10*3/uL (ref 1.4–7.0)
Neutrophils: 58 %
Platelets: 301 10*3/uL (ref 150–379)
RBC: 4.55 x10E6/uL (ref 3.77–5.28)
RDW: 14.1 % (ref 12.3–15.4)
Rhuematoid fact SerPl-aCnc: <10 IU/mL (ref 0.0–13.9)
SED RATE: 7 mm/h (ref 0–40)
Uric Acid: 6.9 mg/dL (ref 2.5–7.1)
WBC: 8.3 10*3/uL (ref 3.4–10.8)

## 2017-12-15 LAB — LIPID PANEL
CHOLESTEROL TOTAL: 194 mg/dL (ref 100–199)
Chol/HDL Ratio: 4.6 ratio — ABNORMAL HIGH (ref 0.0–4.4)
HDL: 42 mg/dL (ref >39)
LDL CALC: 119 mg/dL — AB (ref 0–99)
Triglycerides: 165 mg/dL — ABNORMAL HIGH (ref 0–149)
VLDL Cholesterol Cal: 33 mg/dL (ref 5–40)

## 2017-12-28 ENCOUNTER — Other Ambulatory Visit: Payer: Self-pay | Admitting: Family Medicine

## 2017-12-28 MED FILL — FUROSEMIDE 40 MG TAB: 40 | 90 days supply | Qty: 90 | Fill #1

## 2017-12-28 MED FILL — RAMIPRIL 10 MG CAPSULE: 10 | 90 days supply | Qty: 180 | Fill #1

## 2017-12-28 MED FILL — metFORMIN HCL 500 MG TABS: 500 | 90 days supply | Qty: 180 | Fill #0

## 2018-01-24 ENCOUNTER — Ambulatory Visit (INDEPENDENT_AMBULATORY_CARE_PROVIDER_SITE_OTHER): Payer: No Typology Code available for payment source | Admitting: Family Medicine

## 2018-01-24 ENCOUNTER — Encounter: Payer: Self-pay | Admitting: Family Medicine

## 2018-01-24 VITALS — BP 120/71 | HR 83 | Temp 97.3°F | Ht 67.0 in | Wt 238.0 lb

## 2018-01-24 DIAGNOSIS — M5442 Lumbago with sciatica, left side: Secondary | ICD-10-CM | POA: Diagnosis not present

## 2018-01-24 DIAGNOSIS — M25552 Pain in left hip: Secondary | ICD-10-CM

## 2018-01-24 DIAGNOSIS — G8929 Other chronic pain: Secondary | ICD-10-CM | POA: Diagnosis not present

## 2018-01-24 NOTE — Progress Notes (Signed)
Subjective: CC: Hip pain HPI: [name] is a 57 y.o. female presenting to clinic today for:  1. 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  MR obtained in December 2018 reiterated this.  She had associated atrophy of the gluteus on that study as well.  She presents today and notes that she has been having noticeable pain in the left hip.  She describes the pain as pain that occurs in her left low back, radiates into her left buttock and then down her thigh.  She notes that she has left-sided anterior thigh numbness that has been present since October of last year.  She notes that pain seems to be worse with prolonged sitting and getting up from the seated position.  She attempted to do a hike over the weekend and was unable to because of significant discomfort.  She was previously treated with a corticosteroid injection, which did seem to help.  However, she is reluctant to repeat this given her history of diabetes.  She is wondering what she can do at home to improve symptoms.  ROS: Per HPI  Past Medical History:  Diagnosis Date  . Allergy    seasonal  . Anxiety   . Arthritis   . Diabetes mellitus    type ii  . GERD (gastroesophageal reflux disease)   . Hiatal hernia   . Hypertension   . Irritable bowel syndrome   . Obesity   . Sinus infection 08/14/2017   Symptoms resolved after antiobiotic course  . Tubular adenoma of colon 05/27/2015  . Ulcer    Allergies  Allergen Reactions  . Erythromycin Other (See Comments)    Abdominal pain  . Codeine Other (See Comments)    Hyper   . Sulfa Antibiotics Hives  . Tetracycline Rash    Current Outpatient Medications:  .  ALPRAZolam (XANAX) 0.5 MG tablet, Take 1 tablet (0.5 mg total) by mouth at bedtime as needed for sleep. (Patient not taking: Reported on 08/24/2017), Disp: 60 tablet, Rfl: 0 .  amoxicillin-clavulanate (AUGMENTIN) 875-125  MG tablet, Take 1 tablet by mouth 2 (two) times daily., Disp: 20 tablet, Rfl: 0 .  aspirin EC 81 MG tablet, Take 81 mg by mouth daily., Disp: , Rfl:  .  furosemide (LASIX) 40 MG tablet, TAKE 1 TABLET (40 MG TOTAL) BY MOUTH DAILY., Disp: 90 tablet, Rfl: 1 .  glucose blood (ONETOUCH VERIO) test strip, Check fasting blood sugar 1X a day and prn    Dx 250.01, Disp: 300 each, Rfl: 1 .  hydrocortisone (ANUSOL-HC) 25 MG suppository, Place 1 suppository (25 mg total) rectally 2 (two) times daily. (Patient not taking: Reported on 11/30/2017), Disp: 12 suppository, Rfl: 0 .  metFORMIN (GLUCOPHAGE) 500 MG tablet, TAKE 1 TABLET BY MOUTH TWICE DAILY, Disp: 180 tablet, Rfl: 0 .  Pitavastatin Calcium (LIVALO) 2 MG TABS, Take 2 tablets by mouth at bedtime., Disp: , Rfl:  .  ramipril (ALTACE) 10 MG capsule, TAKE 2 CAPSULES BY MOUTH ONCE DAILY., Disp: 180 capsule, Rfl: 1 .  ranitidine (ZANTAC) 150 MG tablet, TAKE 1 TABLET BY MOUTH 2 TIMES DAILY., Disp: 180 tablet, Rfl: 0  Current Facility-Administered Medications:  .  0.9 %  sodium chloride infusion, 500 mL, Intravenous, Continuous, Pyrtle, Lajuan Lines, MD Social History   Socioeconomic History  . Marital status: Married    Spouse name: Not on file  . Number of children: Not on file  .  Years of education: Not on file  . Highest education level: Not on file  Occupational History  . Not on file  Social Needs  . Financial resource strain: Not on file  . Food insecurity:    Worry: Not on file    Inability: Not on file  . Transportation needs:    Medical: Not on file    Non-medical: Not on file  Tobacco Use  . Smoking status: Never Smoker  . Smokeless tobacco: Never Used  . Tobacco comment: AS A Teenager  Substance and Sexual Activity  . Alcohol use: Yes    Alcohol/week: 0.0 oz    Comment:  occasional wine  . Drug use: No  . Sexual activity: Not on file  Lifestyle  . Physical activity:    Days per week: Not on file    Minutes per session: Not on file    . Stress: Not on file  Relationships  . Social connections:    Talks on phone: Not on file    Gets together: Not on file    Attends religious service: Not on file    Active member of club or organization: Not on file    Attends meetings of clubs or organizations: Not on file    Relationship status: Not on file  . Intimate partner violence:    Fear of current or ex partner: Not on file    Emotionally abused: Not on file    Physically abused: Not on file    Forced sexual activity: Not on file  Other Topics Concern  . Not on file  Social History Narrative  . Not on file   Family History  Problem Relation Age of Onset  . Diabetes Mother   . Kidney disease Mother   . Colitis Father   . Colon polyps Father   . Heart disease Father   . Irritable bowel syndrome Father   . Other Unknown        corkscrew esophagus- father, sister  . Crohn's disease Sister   . Colitis Sister   . Colon polyps Sister   . Diabetes Maternal Aunt   . Diabetes Paternal Aunt   . Diabetes Maternal Grandmother   . Diabetes Paternal Grandmother   . Heart disease Paternal Grandmother   . Colitis Paternal Grandfather   . Colon cancer Neg Hx   . Esophageal cancer Neg Hx   . Pancreatic cancer Neg Hx   . Rectal cancer Neg Hx   . Stomach cancer Neg Hx    Objective: Office vital signs reviewed. BP 120/71   Pulse 83   Temp (!) 97.3 F (36.3 C) (Oral)   Ht 5\' 7"  (1.702 m)   Wt 238 lb (108 kg)   LMP 12/26/2014 (Approximate) Comment: 2-3 days  BMI 37.28 kg/m   Physical Examination:  General: Awake, alert, well nourished, No acute distress MSK: normal gait and normal station  Lumbar spine: No midline tenderness to palpation.  No paraspinal muscle tenderness to palpation.  No palpable bony abnormalities.  Knees: Patient has pain with marked flexion of the left knee.  Lower extremities: Right lower extremity is slightly longer than left lower extremity.  Hips: Right sided anterior innominate  appreciated. Skin: dry; intact; no rashes or lesions  Assessment/ Plan: 57 y.o. female   1. Chronic left-sided low back pain with left-sided sciatica We discussed imaging studies.  Given patient's desire to conservatively treat at this time, will defer imaging study.  Home exercises/ stretches provided for lumbar  pain.  Improve core strength.  NSAIDs PRN.  2. Chronic left hip pain She was noted to have an anterior innominate on the right side today.  I do question as to whether or not she has changed her gait and because this or this is caused her gait to change.  This was corrected using an OMT maneuver today.  Patient did notice some improvement in her discomfort after OT maneuver.  We discussed consideration for referral to Gardenia Phlegm, DO, for further OMT.  His information was provided to the patient.  Home exercises provided and reviewed with the patient.  Follow-up as needed.   [name], DO [place] [phone]

## 2018-01-24 NOTE — Patient Instructions (Signed)
Charlann Boxer with [facility] at [facility] does osteopathic adjustments.  I recommend that you see him if you continue to have issues.  Keep doing the stretches and core strengthening exercises that we discussed.

## 2018-02-06 IMAGING — CT CT ABD-PELV W/ CM
2 of 5 series · 16 of 46 positions shown, 18 images · IV contrast (Isovue)
Comparison: 03/24/2011

CLINICAL DATA: Right upper quadrant pain for 5 weeks.

EXAM:
CT ABDOMEN AND PELVIS WITH CONTRAST
TECHNIQUE: Multidetector CT imaging of the abdomen and pelvis was performed
using the standard protocol following bolus administration of
intravenous contrast.
CONTRAST:  100mL H9T9IX-CTT IOPAMIDOL (H9T9IX-CTT) INJECTION 61%

[Series 2: axial st · axial · 0.98mm/px · z∈[-765,-325]mm · 13 of 100 slices shown, 15 images]
[im 6/100  soft-tissue]
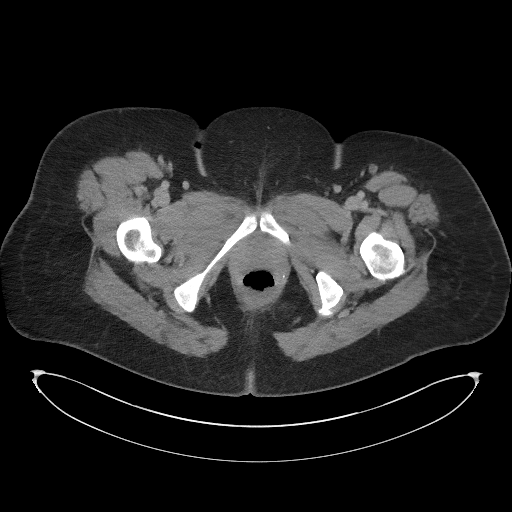
[im 6/100  bone]
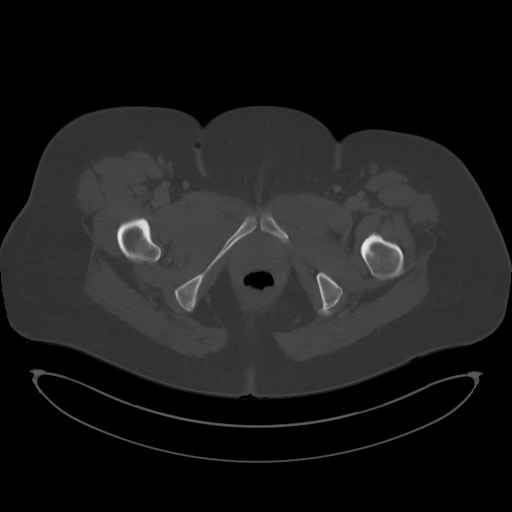
[im 12/100  soft-tissue]
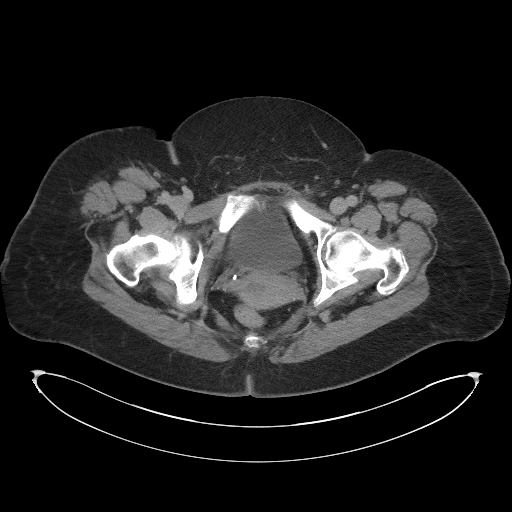
[im 23/100  soft-tissue]
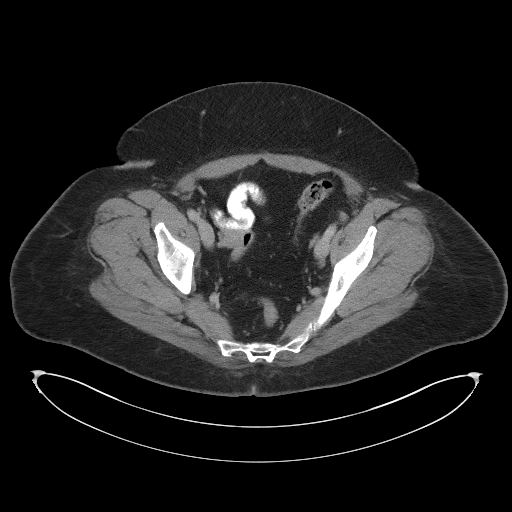
[im 28/100  soft-tissue]
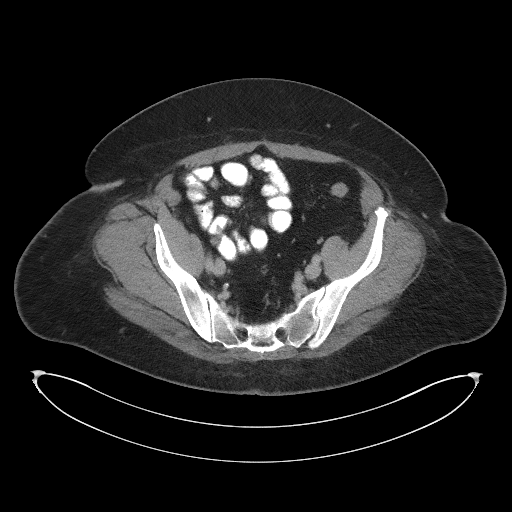
[im 34/100  soft-tissue]
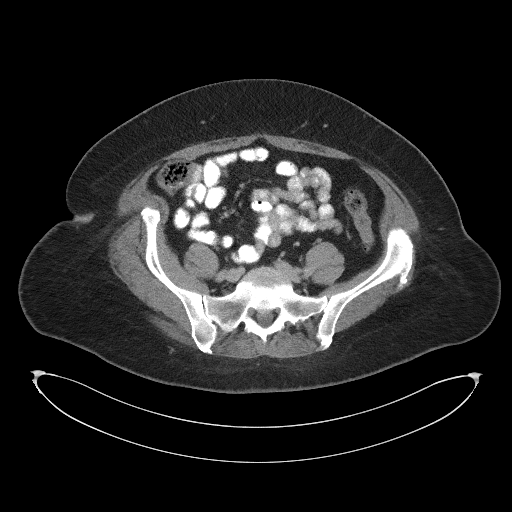
[im 45/100  soft-tissue]
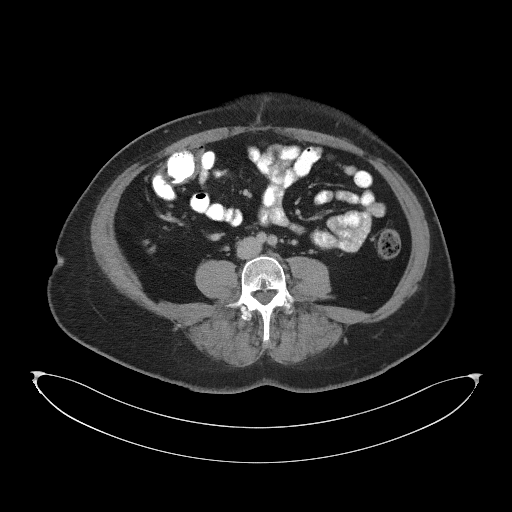
[im 50/100  soft-tissue]
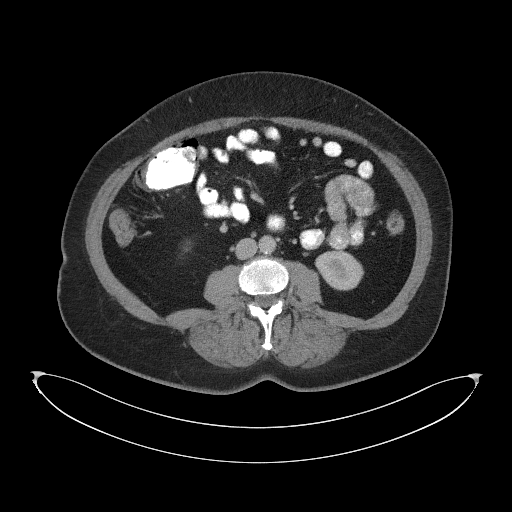
[im 56/100  soft-tissue]
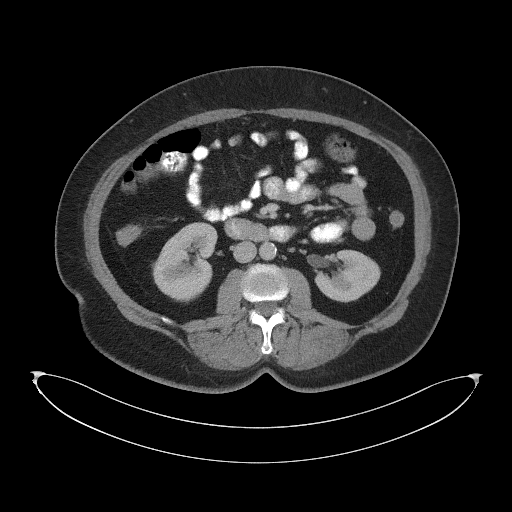
[im 67/100  soft-tissue]
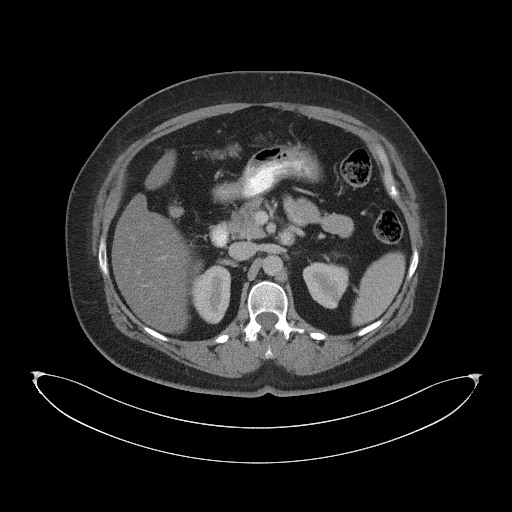
[im 67/100  bone]
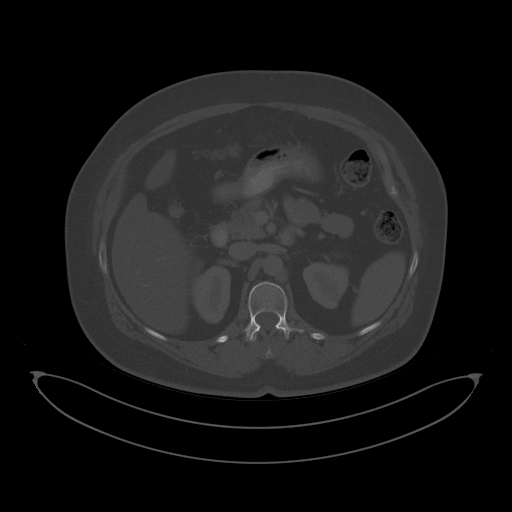
[im 72/100  soft-tissue]
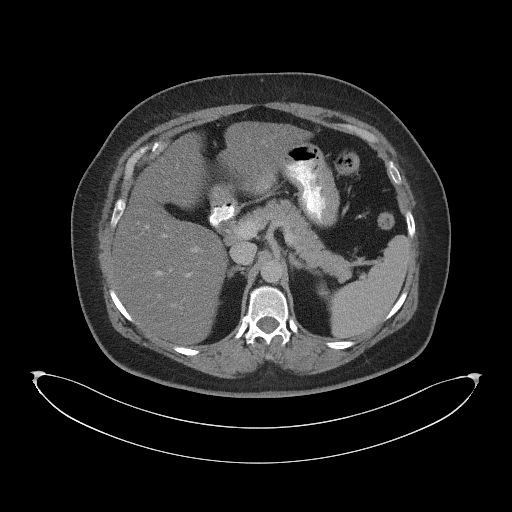
[im 78/100  soft-tissue]
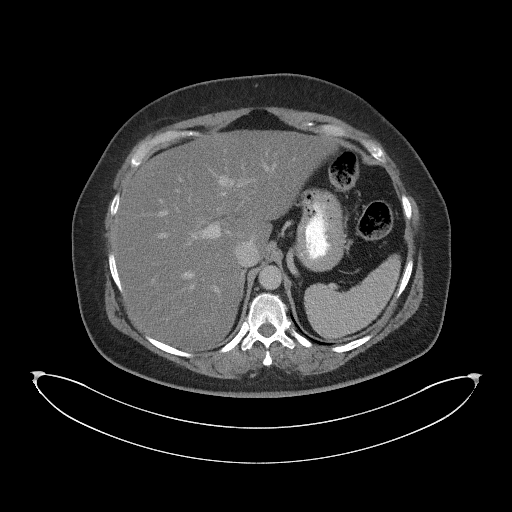
[im 89/100  soft-tissue]
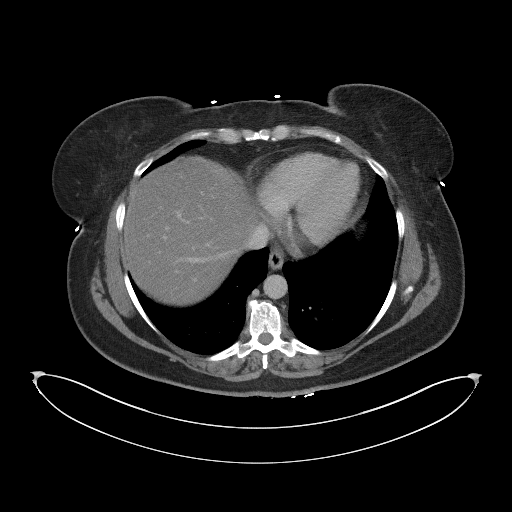
[im 94/100  soft-tissue]
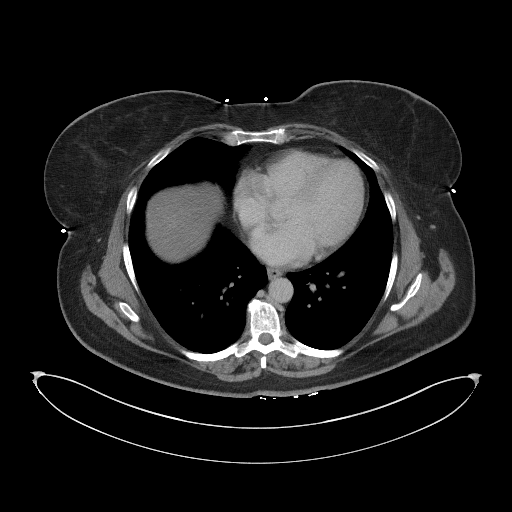

[Series 4: coronal st · coronal · 0.81mm/px · 3 of 116 slices shown]
[im 39/116  soft-tissue]
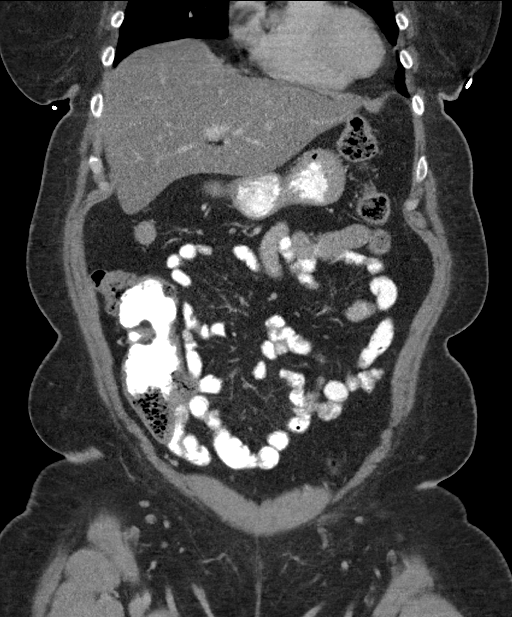
[im 52/116  soft-tissue]
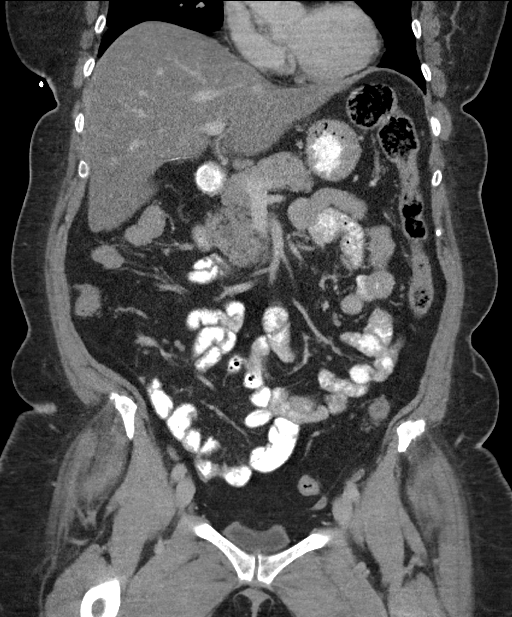
[im 64/116  soft-tissue]
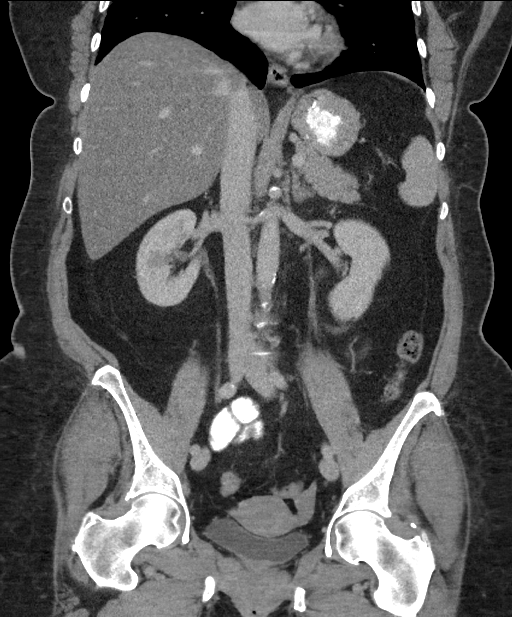

[16 of 46 positions shown; findings below may reference images not displayed]

FINDINGS: Lower chest:  Unremarkable.

Hepatobiliary: The liver shows diffusely decreased attenuation
suggesting steatosis. Liver is enlarged, measuring 19.1 cm
craniocaudal length. Gallbladder surgically absent. No intrahepatic
or extrahepatic biliary dilation.

Pancreas: No focal mass lesion. No dilatation of the main duct. No
intraparenchymal cyst. No peripancreatic edema.

Spleen: No splenomegaly. No focal mass lesion.

Adrenals/Urinary Tract: No adrenal nodule or mass. Tiny hypodense
lesions in both kidneys are too small to characterize but most for
present previously and are unchanged in the interval suggesting
cysts. No evidence for hydroureter. The urinary bladder appears
normal for the degree of distention.

Stomach/Bowel: Stomach is nondistended. No gastric wall thickening.
No evidence of outlet obstruction. Duodenum is normally positioned
as is the ligament of Treitz. No small bowel wall thickening. No
small bowel dilatation. The terminal ileum is normal. The appendix
is normal. No gross colonic mass. No colonic wall thickening. No
substantial diverticular change.

Vascular/Lymphatic: There is abdominal aortic atherosclerosis
without aneurysm. There is no gastrohepatic or hepatoduodenal
ligament lymphadenopathy. No intraperitoneal or retroperitoneal
lymphadenopathy. No pelvic sidewall lymphadenopathy.

Reproductive: The uterus has normal CT imaging appearance. There is
no adnexal mass.

Other: No intraperitoneal free fluid.

Musculoskeletal: Subtle sclerotic focus posterior left iliac bone
unchanged, compatible with benign process. Bone windows reveal no
worrisome lytic or sclerotic osseous lesions.
IMPRESSION: 1. No acute findings in the abdomen or pelvis.
2. Hepatic steatosis with hepatomegaly.

## 2018-02-16 ENCOUNTER — Ambulatory Visit (INDEPENDENT_AMBULATORY_CARE_PROVIDER_SITE_OTHER): Payer: No Typology Code available for payment source | Admitting: Physician Assistant

## 2018-02-16 ENCOUNTER — Encounter: Payer: Self-pay | Admitting: Physician Assistant

## 2018-02-16 VITALS — BP 116/63 | HR 84 | Temp 97.7°F | Ht 67.0 in | Wt 238.0 lb

## 2018-02-16 DIAGNOSIS — R109 Unspecified abdominal pain: Secondary | ICD-10-CM | POA: Diagnosis not present

## 2018-02-16 LAB — URINALYSIS
BILIRUBIN UA: NEGATIVE
Glucose, UA: NEGATIVE
Ketones, UA: NEGATIVE
LEUKOCYTES UA: NEGATIVE
NITRITE UA: NEGATIVE
PH UA: 5.5 (ref 5.0–7.5)
Protein, UA: NEGATIVE
RBC UA: NEGATIVE
SPEC GRAV UA: 1.015 (ref 1.005–1.030)
Urobilinogen, Ur: 0.2 mg/dL (ref 0.2–1.0)

## 2018-02-16 NOTE — Progress Notes (Signed)
  Subjective:     Patient ID: [name], female   DOB: 19-Jan-1961, 57 y.o.   MRN: [id number]  HPI Pt with L back/leg pain intermit but sx worse recently She also has had some urinary hest. So wanted to make sure not related to possible UTI  Review of Systems Pain to the L lower back with some radiation to the L hip Sx worse with prolonged sitting Sx better after up and moving for a period of time Intermit tingling to the L lateral hip/quad area No weakness or giving way of the hip/knee Prev MRI of hip and knee with degen changes but also Baker cyst and mult meniscus tears     Objective:   Physical Exam Gait nl ++ TTP over the L SI joint and midline of L-spine No palp spasm FROM of the Lspine SLR neg + 1/2 leg length discrepancy. R>L UA- neg (see labs)    Assessment:     1. Flank pain         Plan:     Continue with stretching Bolster to lower back with sitting Discussed possible getting standing type desk Also discussed insert to the L shoe Heat/Ice OTC NSAID'S F/U prn

## 2018-02-16 NOTE — Patient Instructions (Signed)

## 2018-02-28 LAB — HM MAMMOGRAPHY

## 2018-03-03 ENCOUNTER — Ambulatory Visit: Payer: Self-pay

## 2018-03-15 ENCOUNTER — Other Ambulatory Visit: Payer: Self-pay | Admitting: Family Medicine

## 2018-03-15 ENCOUNTER — Telehealth: Payer: Self-pay | Admitting: Family Medicine

## 2018-03-15 DIAGNOSIS — K2100 Gastro-esophageal reflux disease with esophagitis, without bleeding: Secondary | ICD-10-CM

## 2018-03-15 MED ORDER — METFORMIN HCL 500 MG PO TABS: 500.0000 mg | ORAL_TABLET | Freq: Two times a day (BID) | ORAL | 3 refills | Status: DC

## 2018-03-15 MED ORDER — RANITIDINE HCL 150 MG PO TABS: 150.0000 mg | ORAL_TABLET | Freq: Two times a day (BID) | ORAL | 3 refills | Status: DC

## 2018-03-15 MED ORDER — FUROSEMIDE 40 MG PO TABS: 40.0000 mg | ORAL_TABLET | Freq: Every day | ORAL | 3 refills | Status: DC

## 2018-03-15 MED ORDER — RAMIPRIL 10 MG PO CAPS
20.0000 mg | ORAL_CAPSULE | Freq: Every day | ORAL | 3 refills | Status: DC
Start: 2018-03-15 — End: 2019-03-28

## 2018-03-15 MED FILL — SHIPPING COST: 1 days supply | Qty: 1 | Fill #0

## 2018-03-15 MED FILL — FUROSEMIDE 40 MG TAB: 40 | 90 days supply | Qty: 90 | Fill #0

## 2018-03-15 MED FILL — raNITIdine HCL 150 MG TABS: 150 | 90 days supply | Qty: 180 | Fill #0

## 2018-03-15 MED FILL — LIVALO 2 MG TABLET: 2 | 90 days supply | Qty: 90 | Fill #0

## 2018-03-15 MED FILL — RAMIPRIL 10 MG CAPS: 10 | 90 days supply | Qty: 180 | Fill #0

## 2018-03-15 MED FILL — metFORMIN HCL 500 MG TABS: 500 | 90 days supply | Qty: 180 | Fill #0

## 2018-03-15 NOTE — Telephone Encounter (Signed)
DOne

## 2018-03-31 ENCOUNTER — Ambulatory Visit (INDEPENDENT_AMBULATORY_CARE_PROVIDER_SITE_OTHER): Payer: No Typology Code available for payment source | Admitting: Family Medicine

## 2018-03-31 ENCOUNTER — Encounter: Payer: Self-pay | Admitting: Family Medicine

## 2018-03-31 VITALS — BP 135/74 | HR 79 | Temp 97.0°F | Ht 67.0 in | Wt 235.0 lb

## 2018-03-31 DIAGNOSIS — H66001 Acute suppurative otitis media without spontaneous rupture of ear drum, right ear: Secondary | ICD-10-CM

## 2018-03-31 MED ORDER — AMOXICILLIN-POT CLAVULANATE 875-125 MG PO TABS: 1.0000 | ORAL_TABLET | Freq: Two times a day (BID) | ORAL | 0 refills | Status: DC

## 2018-03-31 NOTE — Progress Notes (Signed)
   HPI  Patient presents today here with 7 days of ear pain.  Patient explains her symptoms began last week around Friday.  She describes first and muffled hearing followed by persistent and worsening right ear pain. She is also developed some sinus pain and pressure in the frontal sinuses primarily over the last 3 days or so.  No fever, chills, sweats. She is tolerating food and fluids like usual.  PMH: Smoking status noted ROS: Per HPI  Objective: BP 135/74   Pulse 79   Temp (!) 97 F (36.1 C) (Oral)   Ht 5\' 7"  (1.702 m)   Wt 235 lb (106.6 kg)   LMP 12/26/2014 (Approximate) Comment: 2-3 days  BMI 36.81 kg/m  Gen: NAD, alert, cooperative with exam HEENT: NCAT, right TM with erythema and loss of landmarks, left TM within normal limits, tenderness to palpation over bilateral frontal sinuses CV: RRR, good S1/S2, no murmur Resp: CTABL, no wheezes, non-labored Ext: No edema, warm Neuro: Alert and oriented, No gross deficits  Assessment and plan:  #Acute separative otitis media Treat with Augmentin considering likely developing sinusitis Supportive care, call or return for any concerns   Meds ordered this encounter  Medications  . amoxicillin-clavulanate (AUGMENTIN) 875-125 MG tablet    Sig: Take 1 tablet by mouth 2 (two) times daily.    Dispense:  20 tablet    Refill:  0    Laroy Apple, MD [facility] 03/31/2018, 1:07 PM

## 2018-03-31 NOTE — Patient Instructions (Signed)
Otitis Media, Adult Otitis media is redness, soreness, and puffiness (swelling) in the space just behind your eardrum (middle ear). It may be caused by allergies or infection. It often happens along with a cold. Follow these instructions at home:  Take your medicine as told. Finish it even if you start to feel better.  Only take over-the-counter or prescription medicines for pain, discomfort, or fever as told by your doctor.  Follow up with your doctor as told. Contact a doctor if:  You have otitis media only in one ear, or bleeding from your nose, or both.  You notice a lump on your neck.  You are not getting better in 3-5 days.  You feel worse instead of better. Get help right away if:  You have pain that is not helped with medicine.  You have puffiness, redness, or pain around your ear.  You get a stiff neck.  You cannot move part of your face (paralysis).  You notice that the bone behind your ear hurts when you touch it. This information is not intended to replace advice given to you by your health care provider. Make sure you discuss any questions you have with your health care provider. Document Released: 03/08/2008 Document Revised: 02/26/2016 Document Reviewed: 04/17/2013 Elsevier Interactive Patient Education  2017 Elsevier Inc.  

## 2018-04-03 ENCOUNTER — Encounter: Payer: Self-pay | Admitting: Family Medicine

## 2018-04-03 ENCOUNTER — Ambulatory Visit (INDEPENDENT_AMBULATORY_CARE_PROVIDER_SITE_OTHER): Payer: No Typology Code available for payment source | Admitting: Family Medicine

## 2018-04-03 ENCOUNTER — Ambulatory Visit: Payer: No Typology Code available for payment source | Admitting: Family

## 2018-04-03 VITALS — BP 140/72 | HR 76 | Temp 97.0°F | Ht 67.0 in | Wt 236.6 lb

## 2018-04-03 DIAGNOSIS — H66001 Acute suppurative otitis media without spontaneous rupture of ear drum, right ear: Secondary | ICD-10-CM

## 2018-04-03 MED ORDER — METHYLPREDNISOLONE ACETATE 80 MG/ML IJ SUSP
80.0000 mg | Freq: Once | INTRAMUSCULAR | Status: AC
  Administered 2018-04-03: 80 mg via INTRAMUSCULAR

## 2018-04-03 NOTE — Progress Notes (Signed)
   HPI  Patient presents today for this.  Patient states that last night she began developing body aches. She denies any overt fever.  Patient states that her right ear pain has continued and that her sore throat has not gotten any better.  Patient is tolerating food and fluids like usual. No tick bites  PMH: Smoking status noted ROS: Per HPI  Objective: BP 140/72   Pulse 76   Temp (!) 97 F (36.1 C) (Oral)   Ht 5\' 7"  (1.702 m)   Wt 236 lb 9.6 oz (107.3 kg)   LMP 12/26/2014 (Approximate) Comment: 2-3 days  BMI 37.06 kg/m  Gen: NAD, alert, cooperative with exam HEENT: NCAT, oropharynx erythematous with swollen tonsils in the right and left but slightly greater on the right, less erythema of the TM compared to Friday, however she does have what appears to be yellow fluid, her ossicle is now visible CV: RRR, good S1/S2, no murmur Resp: CTABL, no wheezes, non-labored Ext: No edema, warm Neuro: Alert and oriented, No gross deficits  Assessment and plan:  #Acute suppurative otitis media Continued symptoms, worsening in some ways. Patient is on day 3 of antibiotics, no changes to that as I believe she is very good strep coverage with Augmentin IM Depo-Medrol given for symptomatic relief Follow-up as needed    Meds ordered this encounter  Medications  . methylPREDNISolone acetate (DEPO-MEDROL) injection 80 mg    Laroy Apple, MD [facility] 04/03/2018, 9:08 AM

## 2018-04-03 NOTE — Patient Instructions (Signed)
Great to see you!  Let me know if anything else happens.

## 2018-04-05 ENCOUNTER — Other Ambulatory Visit: Payer: Self-pay | Admitting: Physician Assistant

## 2018-04-05 ENCOUNTER — Telehealth: Payer: Self-pay | Admitting: Family Medicine

## 2018-04-05 MED ORDER — NEOMYCIN-POLYMYXIN-HC 1 % OT SOLN: 3.0000 [drp] | Freq: Four times a day (QID) | OTIC | 0 refills | Status: DC

## 2018-04-05 NOTE — Telephone Encounter (Signed)
Patient seen July 2 by another provider for ear pain. Please advise on medication.

## 2018-04-05 NOTE — Telephone Encounter (Signed)
Med sent, patient aware 

## 2018-04-10 ENCOUNTER — Telehealth: Payer: Self-pay | Admitting: Family Medicine

## 2018-04-10 MED ORDER — FLUCONAZOLE 150 MG PO TABS: ORAL_TABLET | ORAL | 0 refills | Status: DC

## 2018-04-10 NOTE — Telephone Encounter (Signed)
Rx sent in and pt is aware. 

## 2018-04-12 ENCOUNTER — Telehealth: Payer: Self-pay | Admitting: Family Medicine

## 2018-04-12 MED ORDER — LEVOFLOXACIN 750 MG PO TABS: 750.0000 mg | ORAL_TABLET | Freq: Every day | ORAL | 0 refills | Status: DC

## 2018-04-12 NOTE — Telephone Encounter (Signed)
Pt with continued symps, with AOM on exam.   Escalate therapy to levaquin  I am happy to refer to ENT also.   Laroy Apple, MD [facility] 04/12/2018, 11:41 AM

## 2018-04-12 NOTE — Telephone Encounter (Signed)
Patient aware and verbalizes understanding. 

## 2018-06-06 ENCOUNTER — Telehealth: Payer: Self-pay | Admitting: Family Medicine

## 2018-06-06 NOTE — Telephone Encounter (Signed)
Samples available and pt aware

## 2018-06-29 ENCOUNTER — Telehealth: Payer: Self-pay | Admitting: Family Medicine

## 2018-06-29 MED FILL — SHIPPING COST: 1 days supply | Qty: 1 | Fill #1

## 2018-06-29 MED FILL — metFORMIN HCL 500 MG TABS: 500 | 90 days supply | Qty: 180 | Fill #0

## 2018-06-29 MED FILL — raNITIdine HCL 150 MG TABS: 150 | 90 days supply | Qty: 180 | Fill #0

## 2018-06-29 MED FILL — FUROSEMIDE 40 MG TAB: 40 | 90 days supply | Qty: 90 | Fill #0

## 2018-06-29 MED FILL — RAMIPRIL 10 MG CAPSULE: 10 | 90 days supply | Qty: 180 | Fill #0

## 2018-06-29 NOTE — Telephone Encounter (Signed)
None available - pt aware

## 2018-06-29 NOTE — Telephone Encounter (Signed)
Pt would like samples of Livalo 2mg  if we have any please.

## 2018-07-04 ENCOUNTER — Encounter: Payer: Self-pay | Admitting: Family

## 2018-07-04 ENCOUNTER — Ambulatory Visit (INDEPENDENT_AMBULATORY_CARE_PROVIDER_SITE_OTHER): Payer: No Typology Code available for payment source | Admitting: Family

## 2018-07-04 ENCOUNTER — Other Ambulatory Visit: Payer: Self-pay | Admitting: Family

## 2018-07-04 ENCOUNTER — Ambulatory Visit (INDEPENDENT_AMBULATORY_CARE_PROVIDER_SITE_OTHER): Payer: No Typology Code available for payment source

## 2018-07-04 VITALS — BP 129/67 | HR 70 | Temp 97.3°F | Ht 67.0 in | Wt 240.0 lb

## 2018-07-04 DIAGNOSIS — M545 Low back pain, unspecified: Secondary | ICD-10-CM

## 2018-07-04 DIAGNOSIS — R109 Unspecified abdominal pain: Secondary | ICD-10-CM

## 2018-07-04 LAB — URINALYSIS, COMPLETE
BILIRUBIN UA: NEGATIVE
GLUCOSE, UA: NEGATIVE
Ketones, UA: NEGATIVE
LEUKOCYTES UA: NEGATIVE
Nitrite, UA: NEGATIVE
PH UA: 7 (ref 5.0–7.5)
PROTEIN UA: NEGATIVE
RBC UA: NEGATIVE
SPEC GRAV UA: 1.02 (ref 1.005–1.030)
Urobilinogen, Ur: 1 mg/dL (ref 0.2–1.0)

## 2018-07-04 LAB — MICROSCOPIC EXAMINATION
BACTERIA UA: NONE SEEN
RBC, UA: NONE SEEN /hpf (ref 0–2)

## 2018-07-04 MED ORDER — METHYLPREDNISOLONE ACETATE 80 MG/ML IJ SUSP
80.0000 mg | Freq: Once | INTRAMUSCULAR | Status: AC
Start: 2018-07-04 — End: 2018-07-04
  Administered 2018-07-04: 80 mg via INTRAMUSCULAR

## 2018-07-04 MED ORDER — KETOROLAC TROMETHAMINE 60 MG/2ML IM SOLN
60.0000 mg | Freq: Once | INTRAMUSCULAR | Status: AC
  Administered 2018-07-04: 60 mg via INTRAMUSCULAR

## 2018-07-04 NOTE — Patient Instructions (Signed)

## 2018-07-04 NOTE — Progress Notes (Signed)
   Subjective:    Patient ID: [name], female    DOB: 1960-10-23, 57 y.o.   MRN: [id number]  Chief Complaint  Patient presents with  . Back Pain    Back Pain  The current episode started 1 to 4 weeks ago. The problem occurs intermittently. The problem has been waxing and waning since onset. The pain is present in the gluteal. The quality of the pain is described as aching. Radiates to: left lower abdomen. The pain is at a severity of 8/10. The pain is moderate. Exacerbated by: moving to stand or sit. Pertinent negatives include no bladder incontinence, bowel incontinence, dysuria or weakness. (Frequent urine ) She has tried NSAIDs for the symptoms. The treatment provided mild relief.      Review of Systems  Gastrointestinal: Negative for bowel incontinence.  Genitourinary: Negative for bladder incontinence and dysuria.  Musculoskeletal: Positive for back pain.  Neurological: Negative for weakness.  All other systems reviewed and are negative.      Objective:   Physical Exam  Constitutional: She is oriented to person, place, and time. She appears well-developed and well-nourished. No distress.  HENT:  Head: Normocephalic and atraumatic.  Right Ear: External ear normal.  Left Ear: External ear normal.  Mouth/Throat: Oropharynx is clear and moist.  Eyes: Pupils are equal, round, and reactive to light.  Neck: Normal range of motion. Neck supple. No thyromegaly present.  Cardiovascular: Normal rate, regular rhythm, normal heart sounds and intact distal pulses.  No murmur heard. Pulmonary/Chest: Effort normal and breath sounds normal. No respiratory distress. She has no wheezes.  Abdominal: Soft. Bowel sounds are normal. She exhibits no distension. There is tenderness (RUQ and RLL tenderness).  Musculoskeletal: She exhibits no edema or tenderness.  Right CVA tenderness, pain in lower lumbar with flexion and extension   Neurological: She is alert and oriented to person, place,  and time. She has normal reflexes. No cranial nerve deficit.  Skin: Skin is warm and dry.  Psychiatric: She has a normal mood and affect. Her behavior is normal. Judgment and thought content normal.  Vitals reviewed.  X-Ray- Osteoarthritis present, Preliminary reading by Evelina Dun, FNP [facility]   BP 129/67   Pulse 70   Temp (!) 97.3 F (36.3 C)   Ht 5\' 7"  (1.702 m)   Wt 240 lb (108.9 kg)   LMP 12/26/2014 (Approximate) Comment: 2-3 days  BMI 37.59 kg/m      Assessment & Plan:  [name] comes in today with chief complaint of Back Pain   Diagnosis and orders addressed:  1. Bilateral low back pain without sciatica, unspecified chronicity Rest Continue ROM exercises Pt does not want muscle relaxer at this time - Urinalysis, Complete - DG Lumbar Spine 2-3 Views; Future - methylPREDNISolone acetate (DEPO-MEDROL) injection 80 mg - ketorolac (TORADOL) injection 60 mg  2. Right flank pain Report any signs of fever or increased s/s of UTI - Urine Culture   Evelina Dun, FNP

## 2018-07-05 LAB — URINE CULTURE

## 2018-07-06 ENCOUNTER — Other Ambulatory Visit: Payer: Self-pay | Admitting: Family

## 2018-07-06 MED ORDER — CIPROFLOXACIN HCL 500 MG PO TABS: 500.0000 mg | ORAL_TABLET | Freq: Two times a day (BID) | ORAL | 0 refills | Status: DC

## 2018-08-08 NOTE — Progress Notes (Deleted)
Subjective: CC: DM2 PCP: [name], DO [id number] B Carrasco is a 57 y.o. female presenting to clinic today for:  1. 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 Side effects: ***  Last eye exam: 09/2017, normal Last foot exam: 11/2017 Last A1c: 6.0 March 2019 Nephropathy screen indicated?: on ACE-I Last flu, zoster and/or pneumovax: PNA vacc needed  ROS: denies fever, chills, dizziness, LOC, polyuria, polydipsia, unintended weight loss/gain, foot ulcerations, numbness or tingling in extremities or chest pain.    ROS: Per HPI  Allergies  Allergen Reactions  . Erythromycin Other (See Comments)    Abdominal pain  . Codeine Other (See Comments)    Hyper   . Sulfa Antibiotics Hives  . Tetracycline Rash   Past Medical History:  Diagnosis Date  . Allergy    seasonal  . Anxiety   . Arthritis   . Diabetes mellitus    type ii  . GERD (gastroesophageal reflux disease)   . Hiatal hernia   . Hypertension   . Irritable bowel syndrome   . Obesity   . Sinus infection 08/14/2017   Symptoms resolved after antiobiotic course  . Tubular adenoma of colon 05/27/2015  . Ulcer     Current Outpatient Medications:  .  ALPRAZolam (XANAX) 0.5 MG tablet, Take 1 tablet (0.5 mg total) by mouth at bedtime as needed for sleep., Disp: 60 tablet, Rfl: 0 .  aspirin EC 81 MG tablet, Take 81 mg by mouth daily., Disp: , Rfl:  .  ciprofloxacin (CIPRO) 500 MG tablet, Take 1 tablet (500 mg total) by mouth 2 (two) times daily., Disp: 14 tablet, Rfl: 0 .  furosemide (LASIX) 40 MG tablet, Take 1 tablet (40 mg total) by mouth daily., Disp: 90 tablet, Rfl: 3 .  glucose blood (ONETOUCH VERIO) test strip, Check fasting blood sugar 1X a day and prn    Dx 250.01, Disp: 300 each, Rfl: 1 .  hydrocortisone (ANUSOL-HC) 25 MG suppository, Place 1 suppository (25 mg  total) rectally 2 (two) times daily., Disp: 12 suppository, Rfl: 0 .  LIVALO 2 MG TABS, TAKE 1 TABLET BY MOUTH AT BEDTIME., Disp: 90 tablet, Rfl: 0 .  metFORMIN (GLUCOPHAGE) 500 MG tablet, Take 1 tablet (500 mg total) by mouth 2 (two) times daily., Disp: 180 tablet, Rfl: 3 .  NEOMYCIN-POLYMYXIN-HYDROCORTISONE (CORTISPORIN) 1 % SOLN OTIC solution, Place 3 drops into both ears 4 (four) times daily., Disp: 10 mL, Rfl: 0 .  ramipril (ALTACE) 10 MG capsule, Take 2 capsules (20 mg total) by mouth daily., Disp: 180 capsule, Rfl: 3 .  ranitidine (ZANTAC) 150 MG tablet, Take 1 tablet (150 mg total) by mouth 2 (two) times daily., Disp: 180 tablet, Rfl: 3 Social History   Socioeconomic History  . Marital status: Married    Spouse name: Not on file  . Number of children: Not on file  . Years of education: Not on file  . Highest education level: Not on file  Occupational History  . Not on file  Social Needs  . Financial resource strain: Not on file  . Food insecurity:    Worry: Not on file    Inability: Not on file  . Transportation needs:    Medical: Not on file    Non-medical: Not on file  Tobacco Use  . Smoking status: Never Smoker  . Smokeless tobacco: Never Used  . Tobacco  comment: AS A Teenager  Substance and Sexual Activity  . Alcohol use: Yes    Alcohol/week: 0.0 standard drinks    Comment:  occasional wine  . Drug use: No  . Sexual activity: Not on file  Lifestyle  . Physical activity:    Days per week: Not on file    Minutes per session: Not on file  . Stress: Not on file  Relationships  . Social connections:    Talks on phone: Not on file    Gets together: Not on file    Attends religious service: Not on file    Active member of club or organization: Not on file    Attends meetings of clubs or organizations: Not on file    Relationship status: Not on file  . Intimate partner violence:    Fear of current or ex partner: Not on file    Emotionally abused: Not on file     Physically abused: Not on file    Forced sexual activity: Not on file  Other Topics Concern  . Not on file  Social History Narrative  . Not on file   Family History  Problem Relation Age of Onset  . Diabetes Mother   . Kidney disease Mother   . Colitis Father   . Colon polyps Father   . Heart disease Father   . Irritable bowel syndrome Father   . Other Unknown        corkscrew esophagus- father, sister  . Crohn's disease Sister   . Colitis Sister   . Colon polyps Sister   . Diabetes Maternal Aunt   . Diabetes Paternal Aunt   . Diabetes Maternal Grandmother   . Diabetes Paternal Grandmother   . Heart disease Paternal Grandmother   . Colitis Paternal Grandfather   . Colon cancer Neg Hx   . Esophageal cancer Neg Hx   . Pancreatic cancer Neg Hx   . Rectal cancer Neg Hx   . Stomach cancer Neg Hx     Objective: Office vital signs reviewed. LMP 12/26/2014 (Approximate) Comment: 2-3 days  Physical Examination:  General: Awake, alert, *** nourished, No acute distress HEENT: Normal    Neck: No masses palpated. No lymphadenopathy    Ears: Tympanic membranes intact, normal light reflex, no erythema, no bulging    Eyes: PERRLA, extraocular membranes intact, sclera ***    Nose: nasal turbinates moist, *** nasal discharge    Throat: moist mucus membranes, no erythema, *** tonsillar exudate.  Airway is patent Cardio: regular rate and rhythm, S1S2 heard, no murmurs appreciated Pulm: clear to auscultation bilaterally, no wheezes, rhonchi or rales; normal work of breathing on room air GI: soft, non-tender, non-distended, bowel sounds present x4, no hepatomegaly, no splenomegaly, no masses GU: external vaginal tissue ***, cervix ***, *** punctate lesions on cervix appreciated, *** discharge from cervical os, *** bleeding, *** cervical motion tenderness, *** abdominal/ adnexal masses Extremities: warm, well perfused, No edema, cyanosis or clubbing; +*** pulses bilaterally MSK: *** gait  and *** station Skin: dry; intact; no rashes or lesions Neuro: *** Strength and light touch sensation grossly intact, *** DTRs ***/4  Assessment/ Plan: 57 y.o. female   ***  No orders of the defined types were placed in this encounter.  No orders of the defined types were placed in this encounter.    [name], DO [place] [phone]

## 2018-08-11 ENCOUNTER — Ambulatory Visit: Payer: No Typology Code available for payment source | Admitting: Family Medicine

## 2018-08-17 ENCOUNTER — Ambulatory Visit (INDEPENDENT_AMBULATORY_CARE_PROVIDER_SITE_OTHER): Payer: No Typology Code available for payment source | Admitting: Family Medicine

## 2018-08-17 ENCOUNTER — Encounter: Payer: Self-pay | Admitting: Family Medicine

## 2018-08-17 VITALS — BP 125/71 | HR 79 | Temp 98.1°F | Ht 67.0 in | Wt 241.0 lb

## 2018-08-17 DIAGNOSIS — I152 Hypertension secondary to endocrine disorders: Secondary | ICD-10-CM

## 2018-08-17 DIAGNOSIS — E1169 Type 2 diabetes mellitus with other specified complication: Secondary | ICD-10-CM

## 2018-08-17 DIAGNOSIS — I10 Essential (primary) hypertension: Secondary | ICD-10-CM

## 2018-08-17 DIAGNOSIS — E1159 Type 2 diabetes mellitus with other circulatory complications: Secondary | ICD-10-CM | POA: Diagnosis not present

## 2018-08-17 DIAGNOSIS — E119 Type 2 diabetes mellitus without complications: Secondary | ICD-10-CM

## 2018-08-17 DIAGNOSIS — E785 Hyperlipidemia, unspecified: Secondary | ICD-10-CM

## 2018-08-17 LAB — BAYER DCA HB A1C WAIVED: HB A1C: 6.2 % (ref <7.0)

## 2018-08-17 NOTE — Progress Notes (Signed)
Subjective: CC: DM2 PCP: [name], DO [id number] [name] is a 57 y.o. female presenting to clinic today for:  1. Type 2 Diabetes w/ hypertension and hyperlipidemia Patient reports Taking medication(s): Metformin 500mg  BID, Altace 20mg  daily, Livalo 2mg . Side effects: none.  She admits that she is not as physically active as she would like to be secondary to knee pain.  Though she goes on to state that the knee pain does seem to be a little bit better than normal.  Additionally, she has not been watching her carbs as closely as she normally does.  Last eye exam: 09/2017, normal Last foot exam: 11/2017 Last A1c: 6.0 March 2019 Nephropathy screen indicated?: on ACE-I Last flu, zoster and/or pneumovax: PNA vacc needed  ROS: denies dizziness, LOC, polyuria, polydipsia, unintended weight loss/gain, foot ulcerations, numbness or tingling in extremities.  No history of foot ulcers in the past.  Vision is stable.  No chest pain or shortness of breath.   Allergies  Allergen Reactions  . Erythromycin Other (See Comments)    Abdominal pain  . Codeine Other (See Comments)    Hyper   . Sulfa Antibiotics Hives  . Tetracycline Rash   Past Medical History:  Diagnosis Date  . Allergy    seasonal  . Anxiety   . Arthritis   . Diabetes mellitus    type ii  . GERD (gastroesophageal reflux disease)   . Hiatal hernia   . Hypertension   . Irritable bowel syndrome   . Obesity   . Sinus infection 08/14/2017   Symptoms resolved after antiobiotic course  . Tubular adenoma of colon 05/27/2015  . Ulcer     Current Outpatient Medications:  .  ALPRAZolam (XANAX) 0.5 MG tablet, Take 1 tablet (0.5 mg total) by mouth at bedtime as needed for sleep., Disp: 60 tablet, Rfl: 0 .  aspirin EC 81 MG tablet, Take 81 mg by mouth daily., Disp: , Rfl:  .  ciprofloxacin (CIPRO) 500 MG tablet, Take 1 tablet (500 mg total) by mouth 2 (two) times daily., Disp: 14 tablet, Rfl: 0 .  furosemide (LASIX) 40 MG  tablet, Take 1 tablet (40 mg total) by mouth daily., Disp: 90 tablet, Rfl: 3 .  glucose blood (ONETOUCH VERIO) test strip, Check fasting blood sugar 1X a day and prn    Dx 250.01, Disp: 300 each, Rfl: 1 .  hydrocortisone (ANUSOL-HC) 25 MG suppository, Place 1 suppository (25 mg total) rectally 2 (two) times daily., Disp: 12 suppository, Rfl: 0 .  LIVALO 2 MG TABS, TAKE 1 TABLET BY MOUTH AT BEDTIME., Disp: 90 tablet, Rfl: 0 .  metFORMIN (GLUCOPHAGE) 500 MG tablet, Take 1 tablet (500 mg total) by mouth 2 (two) times daily., Disp: 180 tablet, Rfl: 3 .  NEOMYCIN-POLYMYXIN-HYDROCORTISONE (CORTISPORIN) 1 % SOLN OTIC solution, Place 3 drops into both ears 4 (four) times daily., Disp: 10 mL, Rfl: 0 .  ramipril (ALTACE) 10 MG capsule, Take 2 capsules (20 mg total) by mouth daily., Disp: 180 capsule, Rfl: 3 .  ranitidine (ZANTAC) 150 MG tablet, Take 1 tablet (150 mg total) by mouth 2 (two) times daily., Disp: 180 tablet, Rfl: 3 Social History   Socioeconomic History  . Marital status: Married    Spouse name: Not on file  . Number of children: Not on file  . Years of education: Not on file  . Highest education level: Not on file  Occupational History  . Not on file  Social Needs  . Financial  resource strain: Not on file  . Food insecurity:    Worry: Not on file    Inability: Not on file  . Transportation needs:    Medical: Not on file    Non-medical: Not on file  Tobacco Use  . Smoking status: Never Smoker  . Smokeless tobacco: Never Used  . Tobacco comment: AS A Teenager  Substance and Sexual Activity  . Alcohol use: Yes    Alcohol/week: 0.0 standard drinks    Comment:  occasional wine  . Drug use: No  . Sexual activity: Not on file  Lifestyle  . Physical activity:    Days per week: Not on file    Minutes per session: Not on file  . Stress: Not on file  Relationships  . Social connections:    Talks on phone: Not on file    Gets together: Not on file    Attends religious service:  Not on file    Active member of club or organization: Not on file    Attends meetings of clubs or organizations: Not on file    Relationship status: Not on file  . Intimate partner violence:    Fear of current or ex partner: Not on file    Emotionally abused: Not on file    Physically abused: Not on file    Forced sexual activity: Not on file  Other Topics Concern  . Not on file  Social History Narrative  . Not on file   Family History  Problem Relation Age of Onset  . Diabetes Mother   . Kidney disease Mother   . Colitis Father   . Colon polyps Father   . Heart disease Father   . Irritable bowel syndrome Father   . Other Unknown        corkscrew esophagus- father, sister  . Crohn's disease Sister   . Colitis Sister   . Colon polyps Sister   . Diabetes Maternal Aunt   . Diabetes Paternal Aunt   . Diabetes Maternal Grandmother   . Diabetes Paternal Grandmother   . Heart disease Paternal Grandmother   . Colitis Paternal Grandfather   . Colon cancer Neg Hx   . Esophageal cancer Neg Hx   . Pancreatic cancer Neg Hx   . Rectal cancer Neg Hx   . Stomach cancer Neg Hx     Objective: Office vital signs reviewed. BP 125/71   Pulse 79   Temp 98.1 F (36.7 C) (Oral)   Ht 5\' 7"  (1.702 m)   Wt 241 lb (109.3 kg)   LMP 12/26/2014 (Approximate) Comment: 2-3 days  BMI 37.75 kg/m   Physical Examination:  General: Awake, alert, well nourished, well appearing. No acute distress HEENT: sclera white, MMM Cardio: regular rate and rhythm, S1S2 heard, no murmurs appreciated Pulm: clear to auscultation bilaterally, no wheezes, rhonchi or rales; normal work of breathing on room air Extremities: warm, well perfused, No edema, cyanosis or clubbing; +2 pulses bilaterally  Assessment/ Plan: 57 y.o. female   1. Controlled type 2 diabetes mellitus without complication, without long-term current use of insulin (HCC) A1c with a slight bump since last check at 6.2 today.  However, still  within controlled range.  Continue current regimen.  We discussed carb modification, increase physical activity.  We also discussed consideration for adding something like Victoza to assist in weight loss.  She does have a history of slow GI tract with chronic constipation.  Victoza may not be the best option for patient.  She has had success with lifestyle modification in the past and is going to proceed with this again.  Plan to follow-up in about 6 months, sooner if needed. - Bayer DCA Hb A1c Waived  2. Hypertension associated with diabetes ([place]) Well-controlled on current regimen.  No changes made.  3. Hyperlipidemia associated with type 2 diabetes mellitus ([place]) Doing well with Livalo.  No side effects.  Check direct LDL. - LDL Cholesterol, Direct   Orders Placed This Encounter  Procedures  . Bayer DCA Hb A1c Waived  . LDL Cholesterol, Direct   No orders of the defined types were placed in this encounter.    [name], DO [place] [phone]

## 2018-08-18 LAB — LDL CHOLESTEROL, DIRECT: LDL Direct: 71 mg/dL (ref 0–99)

## 2018-08-28 ENCOUNTER — Telehealth: Payer: Self-pay | Admitting: Family Medicine

## 2018-08-28 NOTE — Telephone Encounter (Signed)
Samples available and given to patient.

## 2018-09-19 ENCOUNTER — Telehealth: Payer: Self-pay | Admitting: Family Medicine

## 2018-09-19 NOTE — Telephone Encounter (Signed)
We are out of Livalo samples. Pt aware.

## 2018-09-25 ENCOUNTER — Telehealth: Payer: Self-pay | Admitting: *Deleted

## 2018-09-25 ENCOUNTER — Other Ambulatory Visit: Payer: Self-pay | Admitting: Family Medicine

## 2018-09-25 MED ORDER — PITAVASTATIN CALCIUM 2 MG PO TABS: 1.0000 | ORAL_TABLET | Freq: Every day | ORAL | 1 refills | Status: DC

## 2018-09-25 MED FILL — LIVALO 2 MG TABLET: 2 | 90 days supply | Qty: 90 | Fill #0

## 2018-09-25 MED FILL — RAMIPRIL 10 MG CAPSULE: 10 | 90 days supply | Qty: 180 | Fill #1

## 2018-09-25 MED FILL — SHIPPING COST: 1 days supply | Qty: 1 | Fill #2

## 2018-09-25 MED FILL — FUROSEMIDE 40 MG TAB: 40 | 90 days supply | Qty: 90 | Fill #1

## 2018-09-25 MED FILL — metFORMIN HCL 500 MG TABS: 500 | 90 days supply | Qty: 180 | Fill #1

## 2018-09-25 NOTE — Telephone Encounter (Signed)
Pt aware refill sent to pharmacy 

## 2018-10-27 ENCOUNTER — Encounter: Payer: No Typology Code available for payment source | Admitting: Family Medicine

## 2018-11-03 ENCOUNTER — Other Ambulatory Visit: Payer: Self-pay | Admitting: Family Medicine

## 2018-11-03 ENCOUNTER — Other Ambulatory Visit: Payer: No Typology Code available for payment source

## 2018-11-03 DIAGNOSIS — R52 Pain, unspecified: Secondary | ICD-10-CM

## 2018-11-06 ENCOUNTER — Ambulatory Visit (INDEPENDENT_AMBULATORY_CARE_PROVIDER_SITE_OTHER): Payer: No Typology Code available for payment source | Admitting: Nurse Practitioner

## 2018-11-06 ENCOUNTER — Encounter: Payer: Self-pay | Admitting: Nurse Practitioner

## 2018-11-06 VITALS — BP 130/76 | HR 77 | Temp 97.4°F | Wt 242.2 lb

## 2018-11-06 DIAGNOSIS — R059 Cough, unspecified: Secondary | ICD-10-CM

## 2018-11-06 DIAGNOSIS — R05 Cough: Secondary | ICD-10-CM

## 2018-11-06 MED ORDER — BENZONATATE 100 MG PO CAPS: 100.0000 mg | ORAL_CAPSULE | Freq: Three times a day (TID) | ORAL | 0 refills | Status: DC | PRN

## 2018-11-06 MED ORDER — METHYLPREDNISOLONE ACETATE 80 MG/ML IJ SUSP
80.0000 mg | Freq: Once | INTRAMUSCULAR | Status: AC
  Administered 2018-11-06: 80 mg via INTRAMUSCULAR

## 2018-11-06 NOTE — Patient Instructions (Signed)

## 2018-11-06 NOTE — Progress Notes (Signed)
   Subjective:    Patient ID: [name], female    DOB: 03/27/1961, 58 y.o.   MRN: [id number]   Chief Complaint: cough  HPI Patient comes in c/o cough. She was tested for flu last Friday which was negative. She feels some better but cough will not let up. She has been taking hycodan at night which helps but cannot take during the day because it makes her sleeepy.    Review of Systems  Constitutional: Negative for chills and fever.  HENT: Positive for congestion, ear pain (fullness) and sore throat. Negative for trouble swallowing.   Respiratory: Positive for cough (productive).   Neurological: Positive for headaches.  Psychiatric/Behavioral: Negative.   All other systems reviewed and are negative.      Objective:   Physical Exam Constitutional:      General: She is not in acute distress.    Appearance: She is obese.  HENT:     Right Ear: Hearing, tympanic membrane, ear canal and external ear normal.     Left Ear: Hearing, tympanic membrane, ear canal and external ear normal.     Nose: Nose normal.     Right Turbinates: Pale.     Left Turbinates: Pale.     Right Sinus: No maxillary sinus tenderness or frontal sinus tenderness.     Left Sinus: No maxillary sinus tenderness or frontal sinus tenderness.     Mouth/Throat:     Lips: Pink.     Mouth: Mucous membranes are dry.     Pharynx: Oropharynx is clear. Uvula midline.     Tonsils: No tonsillar exudate.  Neck:     Musculoskeletal: Normal range of motion.  Cardiovascular:     Rate and Rhythm: Normal rate and regular rhythm.     Heart sounds: Normal heart sounds.  Pulmonary:     Effort: Pulmonary effort is normal.     Breath sounds: Normal breath sounds.  Skin:    General: Skin is warm and dry.  Neurological:     General: No focal deficit present.     Mental Status: She is alert and oriented to person, place, and time.  Psychiatric:        Mood and Affect: Mood normal.        Behavior: Behavior normal.     BP  130/76   Pulse 77   Temp (!) 97.4 F (36.3 C) (Oral)   Wt 242 lb 3.2 oz (109.9 kg)   LMP 12/26/2014 (Approximate) Comment: 2-3 days  BMI 37.93 kg/m        Assessment & Plan:  Anayelli B Rohlman in today with chief complaint of URI (prod cough, drainage, wheezing, bilateral ear fullness started on Friday)   1. Cough Force fluids Humidifier RTO prn - benzonatate (TESSALON PERLES) 100 MG capsule; Take 1 capsule (100 mg total) by mouth 3 (three) times daily as needed for cough.  Dispense: 20 capsule; Refill: 0 - methylPREDNISolone acetate (DEPO-MEDROL) injection 80 mg   [name]-Margaret Hassell Done, FNP

## 2018-11-07 ENCOUNTER — Other Ambulatory Visit: Payer: No Typology Code available for payment source

## 2018-11-07 ENCOUNTER — Other Ambulatory Visit: Payer: Self-pay

## 2018-11-07 DIAGNOSIS — R52 Pain, unspecified: Secondary | ICD-10-CM

## 2018-11-07 LAB — VERITOR FLU A/B WAIVED
INFLUENZA A: NEGATIVE
Influenza B: NEGATIVE

## 2018-11-08 LAB — VERITOR FLU A/B WAIVED
INFLUENZA A: NEGATIVE
INFLUENZA B: NEGATIVE

## 2018-12-19 MED FILL — FUROSEMIDE 40 MG TAB: 40 | 90 days supply | Qty: 90 | Fill #2

## 2018-12-19 MED FILL — LIVALO 2 MG TABLET: 2 | 90 days supply | Qty: 90 | Fill #1

## 2018-12-19 MED FILL — metFORMIN HCL 500 MG TABS: 500 | 90 days supply | Qty: 180 | Fill #2

## 2018-12-19 MED FILL — RAMIPRIL 10 MG CAPSULE: 10 | 90 days supply | Qty: 180 | Fill #2

## 2019-01-02 ENCOUNTER — Ambulatory Visit: Payer: No Typology Code available for payment source | Admitting: Family Medicine

## 2019-02-16 ENCOUNTER — Other Ambulatory Visit: Payer: Self-pay

## 2019-02-16 ENCOUNTER — Ambulatory Visit (INDEPENDENT_AMBULATORY_CARE_PROVIDER_SITE_OTHER): Payer: No Typology Code available for payment source | Admitting: Family Medicine

## 2019-02-16 VITALS — BP 116/70 | HR 84 | Temp 98.1°F | Ht 67.0 in | Wt 235.0 lb

## 2019-02-16 DIAGNOSIS — R300 Dysuria: Secondary | ICD-10-CM | POA: Diagnosis not present

## 2019-02-16 DIAGNOSIS — E1169 Type 2 diabetes mellitus with other specified complication: Secondary | ICD-10-CM

## 2019-02-16 DIAGNOSIS — E119 Type 2 diabetes mellitus without complications: Secondary | ICD-10-CM

## 2019-02-16 DIAGNOSIS — I10 Essential (primary) hypertension: Secondary | ICD-10-CM

## 2019-02-16 DIAGNOSIS — Z130 Encounter for screening for diseases of the blood and blood-forming organs and certain disorders involving the immune mechanism: Secondary | ICD-10-CM

## 2019-02-16 DIAGNOSIS — E785 Hyperlipidemia, unspecified: Secondary | ICD-10-CM

## 2019-02-16 DIAGNOSIS — E1159 Type 2 diabetes mellitus with other circulatory complications: Secondary | ICD-10-CM | POA: Diagnosis not present

## 2019-02-16 LAB — URINALYSIS, COMPLETE
Bilirubin, UA: NEGATIVE
Glucose, UA: NEGATIVE
Ketones, UA: NEGATIVE
Leukocytes,UA: NEGATIVE
Nitrite, UA: NEGATIVE
Protein,UA: NEGATIVE
RBC, UA: NEGATIVE
Specific Gravity, UA: 1.02 (ref 1.005–1.030)
Urobilinogen, Ur: 0.2 mg/dL (ref 0.2–1.0)
pH, UA: 7 (ref 5.0–7.5)

## 2019-02-16 LAB — MICROSCOPIC EXAMINATION: Renal Epithel, UA: NONE SEEN /hpf

## 2019-02-16 MED ORDER — FLUCONAZOLE 150 MG PO TABS: 150.0000 mg | ORAL_TABLET | Freq: Once | ORAL | 0 refills | Status: AC

## 2019-02-16 MED ORDER — CIPROFLOXACIN HCL 250 MG PO TABS: 250.0000 mg | ORAL_TABLET | Freq: Two times a day (BID) | ORAL | 0 refills | Status: DC

## 2019-02-16 NOTE — Progress Notes (Signed)
Subjective: CC: dysuria PCP: [name], DO [id number] B Butkiewicz is a 58 y.o. female presenting to clinic today for:  1. Dysuria Patient reports a 1 day history of lower pelvic pressure/pain and urinary urgency.  She notes that her urinary tract infections often start this way and have in the past had normal urinalysis which has proceeded to pyelonephritis in just a couple of days.  She was worried about going into the weekend with these early symptoms without being seen.  No nausea, vomiting, dysuria, hematuria or fevers.  Bowel movements are normal.  No abnormal vaginal discharge.   ROS: Per HPI  Allergies  Allergen Reactions  . Erythromycin Other (See Comments)    Abdominal pain  . Codeine Other (See Comments)    Hyper   . Sulfa Antibiotics Hives  . Tetracycline Rash   Past Medical History:  Diagnosis Date  . Allergy    seasonal  . Anxiety   . Arthritis   . Diabetes mellitus    type ii  . GERD (gastroesophageal reflux disease)   . Hiatal hernia   . Hypertension   . Irritable bowel syndrome   . Obesity   . Sinus infection 08/14/2017   Symptoms resolved after antiobiotic course  . Tubular adenoma of colon 05/27/2015  . Ulcer     Current Outpatient Medications:  .  aspirin EC 81 MG tablet, Take 81 mg by mouth daily., Disp: , Rfl:  .  furosemide (LASIX) 40 MG tablet, Take 1 tablet (40 mg total) by mouth daily., Disp: 90 tablet, Rfl: 3 .  hydrocortisone (ANUSOL-HC) 25 MG suppository, Place 1 suppository (25 mg total) rectally 2 (two) times daily., Disp: 12 suppository, Rfl: 0 .  metFORMIN (GLUCOPHAGE) 500 MG tablet, Take 1 tablet (500 mg total) by mouth 2 (two) times daily., Disp: 180 tablet, Rfl: 3 .  ALPRAZolam (XANAX) 0.5 MG tablet, Take 1 tablet (0.5 mg total) by mouth at bedtime as needed for sleep. (Patient not taking: Reported on 02/16/2019), Disp: 60 tablet, Rfl: 0 .  glucose blood (ONETOUCH VERIO) test strip, Check fasting blood sugar 1X a day and prn     Dx 250.01, Disp: 300 each, Rfl: 1 .  Pitavastatin Calcium (LIVALO) 2 MG TABS, Take 1 tablet (2 mg total) by mouth at bedtime., Disp: 90 tablet, Rfl: 1 .  ramipril (ALTACE) 10 MG capsule, Take 2 capsules (20 mg total) by mouth daily., Disp: 180 capsule, Rfl: 3 .  ranitidine (ZANTAC) 150 MG tablet, Take 1 tablet (150 mg total) by mouth 2 (two) times daily., Disp: 180 tablet, Rfl: 3 Social History   Socioeconomic History  . Marital status: Married    Spouse name: Not on file  . Number of children: Not on file  . Years of education: Not on file  . Highest education level: Not on file  Occupational History  . Not on file  Social Needs  . Financial resource strain: Not on file  . Food insecurity:    Worry: Not on file    Inability: Not on file  . Transportation needs:    Medical: Not on file    Non-medical: Not on file  Tobacco Use  . Smoking status: Never Smoker  . Smokeless tobacco: Never Used  . Tobacco comment: AS A Teenager  Substance and Sexual Activity  . Alcohol use: Yes    Alcohol/week: 0.0 standard drinks    Comment:  occasional wine  . Drug use: No  . Sexual activity: Not on  file  Lifestyle  . Physical activity:    Days per week: Not on file    Minutes per session: Not on file  . Stress: Not on file  Relationships  . Social connections:    Talks on phone: Not on file    Gets together: Not on file    Attends religious service: Not on file    Active member of club or organization: Not on file    Attends meetings of clubs or organizations: Not on file    Relationship status: Not on file  . Intimate partner violence:    Fear of current or ex partner: Not on file    Emotionally abused: Not on file    Physically abused: Not on file    Forced sexual activity: Not on file  Other Topics Concern  . Not on file  Social History Narrative  . Not on file   Family History  Problem Relation Age of Onset  . Diabetes Mother   . Kidney disease Mother   . Colitis Father    . Colon polyps Father   . Heart disease Father   . Irritable bowel syndrome Father   . Other Unknown        corkscrew esophagus- father, sister  . Crohn's disease Sister   . Colitis Sister   . Colon polyps Sister   . Diabetes Maternal Aunt   . Diabetes Paternal Aunt   . Diabetes Maternal Grandmother   . Diabetes Paternal Grandmother   . Heart disease Paternal Grandmother   . Colitis Paternal Grandfather   . Colon cancer Neg Hx   . Esophageal cancer Neg Hx   . Pancreatic cancer Neg Hx   . Rectal cancer Neg Hx   . Stomach cancer Neg Hx     Objective: Office vital signs reviewed. BP 116/70   Pulse 84   Temp 98.1 F (36.7 C) (Oral)   Ht '5\' 7"'$  (1.702 m)   Wt 235 lb (106.6 kg)   LMP 12/26/2014 (Approximate) Comment: 2-3 days  BMI 36.81 kg/m   Physical Examination:  General: Awake, alert, well nourished, No acute distress GU: +suprapubic TTP, no CVA TTP  Assessment/ Plan: 58 y.o. female   1. Dysuria Urinalysis was negative.  Urine microscopy with few bacteria but no other significant findings.  I sent this for urine culture.  Given her history of quick progression into pyelonephritis with need for hospitalization in the past I have gone ahead and given her a prescription for Cipro twice daily and Diflucan.  Indications for use discussed.  She voices good understanding and will follow-up PRN - Urinalysis, Complete - ciprofloxacin (CIPRO) 250 MG tablet; Take 1 tablet (250 mg total) by mouth 2 (two) times daily.  Dispense: 6 tablet; Refill: 0 - fluconazole (DIFLUCAN) 150 MG tablet; Take 1 tablet (150 mg total) by mouth once for 1 dose.  Dispense: 1 tablet; Refill: 0 - Urine Culture  2. Controlled type 2 diabetes mellitus without complication, without long-term current use of insulin ([place]) I have placed future orders for her labs.  She will schedule follow-up for the below issues.  These were not addressed today. - Bayer DCA Hb A1c Waived; Future  3. Hyperlipidemia associated  with type 2 diabetes mellitus (HCC) - CMP14+EGFR; Future - Lipid Panel; Future - TSH; Future  4. Hypertension associated with diabetes ([place]) - CMP14+EGFR; Future  5. Screening, anemia, deficiency, iron - CBC; Future   Orders Placed This Encounter  Procedures  . Urinalysis, Complete  No orders of the defined types were placed in this encounter.    [name], DO [place] [phone]

## 2019-02-18 LAB — URINE CULTURE

## 2019-02-21 ENCOUNTER — Telehealth: Payer: Self-pay | Admitting: Family Medicine

## 2019-02-21 ENCOUNTER — Other Ambulatory Visit: Payer: Self-pay | Admitting: Physician Assistant

## 2019-02-21 MED ORDER — LEVOFLOXACIN 500 MG PO TABS: 500.0000 mg | ORAL_TABLET | Freq: Every day | ORAL | 0 refills | Status: DC

## 2019-02-21 NOTE — Telephone Encounter (Signed)
As back up provider for patient's pcp, please advise on new medication.

## 2019-02-21 NOTE — Telephone Encounter (Signed)
Aware. 

## 2019-02-21 NOTE — Telephone Encounter (Signed)
sent 

## 2019-02-28 ENCOUNTER — Encounter: Payer: Self-pay | Admitting: Family Medicine

## 2019-02-28 ENCOUNTER — Other Ambulatory Visit: Payer: Self-pay

## 2019-02-28 ENCOUNTER — Ambulatory Visit
Admission: RE | Admit: 2019-02-28 | Discharge: 2019-02-28 | Disposition: A | Discharge status: Home / Self Care | Payer: No Typology Code available for payment source | Source: Ambulatory Visit | Attending: Family Medicine | Admitting: Family Medicine

## 2019-02-28 ENCOUNTER — Ambulatory Visit (INDEPENDENT_AMBULATORY_CARE_PROVIDER_SITE_OTHER): Payer: No Typology Code available for payment source | Admitting: Family Medicine

## 2019-02-28 ENCOUNTER — Other Ambulatory Visit: Payer: Self-pay | Admitting: Family Medicine

## 2019-02-28 VITALS — BP 115/71 | HR 72 | Temp 97.4°F | Ht 67.0 in | Wt 236.0 lb

## 2019-02-28 DIAGNOSIS — R109 Unspecified abdominal pain: Secondary | ICD-10-CM

## 2019-02-28 DIAGNOSIS — E119 Type 2 diabetes mellitus without complications: Secondary | ICD-10-CM

## 2019-02-28 DIAGNOSIS — E1159 Type 2 diabetes mellitus with other circulatory complications: Secondary | ICD-10-CM

## 2019-02-28 DIAGNOSIS — R1011 Right upper quadrant pain: Secondary | ICD-10-CM

## 2019-02-28 DIAGNOSIS — E1169 Type 2 diabetes mellitus with other specified complication: Secondary | ICD-10-CM

## 2019-02-28 DIAGNOSIS — Z130 Encounter for screening for diseases of the blood and blood-forming organs and certain disorders involving the immune mechanism: Secondary | ICD-10-CM

## 2019-02-28 LAB — BAYER DCA HB A1C WAIVED: HB A1C (BAYER DCA - WAIVED): 6.2 % (ref <7.0)

## 2019-02-28 LAB — LIPID PANEL

## 2019-02-28 MED ORDER — METHYLPREDNISOLONE ACETATE 40 MG/ML IJ SUSP
40.0000 mg | Freq: Once | INTRAMUSCULAR | Status: AC
  Administered 2019-02-28: 15:00:00 40 mg via INTRAMUSCULAR

## 2019-02-28 NOTE — Progress Notes (Addendum)
Subjective: CC: Flank pain PCP: [name], DO OEV:[id number] [name] is a 58 y.o. female presenting to clinic today for:  1.  Flank pain Patient with ongoing right-sided flank pain that has been present for greater than 2 weeks now.  She had a urinalysis at our last visit which was unremarkable but she was given treatment for possible UTI given her history of hospitalization with unremarkable urinalysis in the past.  She completed that round of antibiotics and felt that symptoms had gotten slightly better but had not resolved and therefore she asked for Levaquin.  She was prescribed a 10-day course of that and has now completed it.  She has not noticed resolution in her symptoms and therefore presents to the office again today.  Denies dysuria, hematuria, nausea, vomiting, fevers.  She has had some associated right upper quadrant pain.  Sometimes pain is worse with certain movements.  It is reproducible with palpation.  No known history of renal stones in the past.   ROS: Per HPI  Allergies  Allergen Reactions  . Erythromycin Other (See Comments)    Abdominal pain  . Codeine Other (See Comments)    Hyper   . Sulfa Antibiotics Hives  . Tetracycline Rash   Past Medical History:  Diagnosis Date  . Allergy    seasonal  . Anxiety   . Arthritis   . Diabetes mellitus    type ii  . GERD (gastroesophageal reflux disease)   . Hiatal hernia   . Hypertension   . Irritable bowel syndrome   . Obesity   . Sinus infection 08/14/2017   Symptoms resolved after antiobiotic course  . Tubular adenoma of colon 05/27/2015  . Ulcer     Current Outpatient Medications:  .  ALPRAZolam (XANAX) 0.5 MG tablet, Take 1 tablet (0.5 mg total) by mouth at bedtime as needed for sleep., Disp: 60 tablet, Rfl: 0 .  aspirin EC 81 MG tablet, Take 81 mg by mouth daily., Disp: , Rfl:  .  furosemide (LASIX) 40 MG tablet, Take 1 tablet (40 mg total) by mouth daily., Disp: 90 tablet, Rfl: 3 .  glucose blood  (ONETOUCH VERIO) test strip, Check fasting blood sugar 1X a day and prn    Dx 250.01, Disp: 300 each, Rfl: 1 .  hydrocortisone (ANUSOL-HC) 25 MG suppository, Place 1 suppository (25 mg total) rectally 2 (two) times daily., Disp: 12 suppository, Rfl: 0 .  levofloxacin (LEVAQUIN) 500 MG tablet, Take 1 tablet (500 mg total) by mouth daily., Disp: 10 tablet, Rfl: 0 .  metFORMIN (GLUCOPHAGE) 500 MG tablet, Take 1 tablet (500 mg total) by mouth 2 (two) times daily., Disp: 180 tablet, Rfl: 3 .  Pitavastatin Calcium (LIVALO) 2 MG TABS, Take 1 tablet (2 mg total) by mouth at bedtime., Disp: 90 tablet, Rfl: 1 .  ramipril (ALTACE) 10 MG capsule, Take 2 capsules (20 mg total) by mouth daily., Disp: 180 capsule, Rfl: 3 .  ranitidine (ZANTAC) 150 MG tablet, Take 1 tablet (150 mg total) by mouth 2 (two) times daily., Disp: 180 tablet, Rfl: 3 Social History   Socioeconomic History  . Marital status: Married    Spouse name: Not on file  . Number of children: Not on file  . Years of education: Not on file  . Highest education level: Not on file  Occupational History  . Not on file  Social Needs  . Financial resource strain: Not on file  . Food insecurity:    Worry: Not  on file    Inability: Not on file  . Transportation needs:    Medical: Not on file    Non-medical: Not on file  Tobacco Use  . Smoking status: Never Smoker  . Smokeless tobacco: Never Used  . Tobacco comment: AS A Teenager  Substance and Sexual Activity  . Alcohol use: Yes    Alcohol/week: 0.0 standard drinks    Comment:  occasional wine  . Drug use: No  . Sexual activity: Not on file  Lifestyle  . Physical activity:    Days per week: Not on file    Minutes per session: Not on file  . Stress: Not on file  Relationships  . Social connections:    Talks on phone: Not on file    Gets together: Not on file    Attends religious service: Not on file    Active member of club or organization: Not on file    Attends meetings of clubs  or organizations: Not on file    Relationship status: Not on file  . Intimate partner violence:    Fear of current or ex partner: Not on file    Emotionally abused: Not on file    Physically abused: Not on file    Forced sexual activity: Not on file  Other Topics Concern  . Not on file  Social History Narrative  . Not on file   Family History  Problem Relation Age of Onset  . Diabetes Mother   . Kidney disease Mother   . Colitis Father   . Colon polyps Father   . Heart disease Father   . Irritable bowel syndrome Father   . Other Unknown        corkscrew esophagus- father, sister  . Crohn's disease Sister   . Colitis Sister   . Colon polyps Sister   . Diabetes Maternal Aunt   . Diabetes Paternal Aunt   . Diabetes Maternal Grandmother   . Diabetes Paternal Grandmother   . Heart disease Paternal Grandmother   . Colitis Paternal Grandfather   . Colon cancer Neg Hx   . Esophageal cancer Neg Hx   . Pancreatic cancer Neg Hx   . Rectal cancer Neg Hx   . Stomach cancer Neg Hx     Objective: Office vital signs reviewed. BP 115/71   Pulse 72   Temp (!) 97.4 F (36.3 C) (Oral)   Ht _0  (1.702 m)   Wt 236 lb (107 kg)   LMP 12/26/2014 (Approximate) Comment: 2-3 days  BMI 36.96 kg/m   Physical Examination:  General: Awake, alert, well nourished, nontoxic. Appears uncomfortable. HEENT: sclera white, MMM GU: no suprapubic TTP GI: +mild RUQ TTP MSK: +right CVA TTP Skin: no jaundice  Assessment/ Plan: 58 y.o. female   1. Flank pain, acute At this point, she has ongoing pain with no evidence of infection.  Need to rule out renal stone.  CT renal stone has been ordered.  Hopefully this will also give [place] a better picture of her liver and gallbladder as well.  Currently, she is not appearing toxic.  Vital signs are stable.  She was given a dose of Depo-Medrol 40 mg here in office to help with pain. - CT RENAL STONE STUDY; Future  2. Right upper quadrant abdominal pain -  CT RENAL STONE STUDY; Future  3. Hyperlipidemia associated with type 2 diabetes mellitus ([place]) Fasting labs have been placed at previous visit.  These items were not discussed during today's  visit. - TSH - Lipid Panel - CMP14+EGFR  4. Screening, anemia, deficiency, iron - CBC  5. Hypertension associated with diabetes ([place]) - CMP14+EGFR  6. Controlled type 2 diabetes mellitus without complication, without long-term current use of insulin (HCC) - Bayer DCA Hb A1c Waived   No orders of the defined types were placed in this encounter.  No orders of the defined types were placed in this encounter.    [name], DO [place] [phone]

## 2019-02-28 NOTE — Addendum Note (Signed)
Addended by: [name] on: 02/28/2019 02:37 PM   Modules accepted: Orders

## 2019-03-01 ENCOUNTER — Telehealth: Payer: Self-pay | Admitting: Internal Medicine

## 2019-03-01 LAB — CMP14+EGFR
ALT: 29 IU/L (ref 0–32)
AST: 25 IU/L (ref 0–40)
Albumin/Globulin Ratio: 2.2 (ref 1.2–2.2)
Albumin: 4.6 g/dL (ref 3.8–4.9)
Alkaline Phosphatase: 53 IU/L (ref 39–117)
BUN/Creatinine Ratio: 11 (ref 9–23)
BUN: 8 mg/dL (ref 6–24)
Bilirubin Total: 0.4 mg/dL (ref 0.0–1.2)
CO2: 26 mmol/L (ref 20–29)
Calcium: 9.9 mg/dL (ref 8.7–10.2)
Chloride: 100 mmol/L (ref 96–106)
Creatinine, Ser: 0.76 mg/dL (ref 0.57–1.00)
GFR calc Af Amer: 101 mL/min/{1.73_m2} (ref >59)
GFR calc non Af Amer: 88 mL/min/{1.73_m2} (ref >59)
Globulin, Total: 2.1 g/dL (ref 1.5–4.5)
Glucose: 113 mg/dL — ABNORMAL HIGH (ref 65–99)
Potassium: 4 mmol/L (ref 3.5–5.2)
Sodium: 141 mmol/L (ref 134–144)
Total Protein: 6.7 g/dL (ref 6.0–8.5)

## 2019-03-01 LAB — CBC
Hematocrit: 38.9 % (ref 34.0–46.6)
Hemoglobin: 13.2 g/dL (ref 11.1–15.9)
MCH: 30.5 pg (ref 26.6–33.0)
MCHC: 33.9 g/dL (ref 31.5–35.7)
MCV: 90 fL (ref 79–97)
Platelets: 284 10*3/uL (ref 150–450)
RBC: 4.33 x10E6/uL (ref 3.77–5.28)
RDW: 13.1 % (ref 11.7–15.4)
WBC: 8.2 10*3/uL (ref 3.4–10.8)

## 2019-03-01 LAB — LIPID PANEL
Chol/HDL Ratio: 3.5 ratio (ref 0.0–4.4)
Cholesterol, Total: 166 mg/dL (ref 100–199)
HDL: 48 mg/dL (ref >39)
LDL Calculated: 89 mg/dL (ref 0–99)
Triglycerides: 147 mg/dL (ref 0–149)
VLDL Cholesterol Cal: 29 mg/dL (ref 5–40)

## 2019-03-01 LAB — TSH: TSH: 2.05 u[IU]/mL (ref 0.450–4.500)

## 2019-03-01 NOTE — Telephone Encounter (Signed)
Pt called in requesting to speak with nurse linda she stated she had some additional questions to ask her about her upcoming appts. She would like a call back

## 2019-03-01 NOTE — Telephone Encounter (Signed)
Spoke with pt and she was interested in an appt for procedure next Thursday but explained Dr. Hilarie Fredrickson is not doing procedures that day. Pt given additional dates and states she will call back tomorrow if she decides to move the procedure to a different date.

## 2019-03-21 ENCOUNTER — Other Ambulatory Visit: Payer: Self-pay

## 2019-03-21 ENCOUNTER — Ambulatory Visit: Payer: No Typology Code available for payment source | Admitting: *Deleted

## 2019-03-21 VITALS — Ht 67.0 in | Wt 231.0 lb

## 2019-03-21 DIAGNOSIS — Z86010 Personal history of colonic polyps: Secondary | ICD-10-CM

## 2019-03-21 MED ORDER — NA SULFATE-K SULFATE-MG SULF 17.5-3.13-1.6 GM/177ML PO SOLN: 1.0000 | Freq: Once | ORAL | 0 refills | Status: AC

## 2019-03-21 MED FILL — SUPREP BOWEL PREP KIT: 17.5-3.13-1 | 2 days supply | Qty: 354 | Fill #0

## 2019-03-21 NOTE — Progress Notes (Signed)

## 2019-03-22 ENCOUNTER — Telehealth: Payer: Self-pay | Admitting: Family Medicine

## 2019-03-22 ENCOUNTER — Other Ambulatory Visit: Payer: Self-pay | Admitting: Physician Assistant

## 2019-03-22 MED ORDER — ONDANSETRON 8 MG PO TBDP: 8.0000 mg | ORAL_TABLET | Freq: Three times a day (TID) | ORAL | 0 refills | Status: DC | PRN

## 2019-03-22 NOTE — Telephone Encounter (Signed)
Pt aware.

## 2019-03-22 NOTE — Telephone Encounter (Signed)
Pt needed the rx to go to [facility] so called [facility] and cancelled rx and resent rx to [facility] as requested.

## 2019-03-22 NOTE — Telephone Encounter (Signed)
Prescription is sent to [facility]

## 2019-03-22 NOTE — Addendum Note (Signed)
Addended by: Marylin Crosby on: 03/22/2019 01:46 PM   Modules accepted: Orders

## 2019-03-28 ENCOUNTER — Other Ambulatory Visit: Payer: Self-pay | Admitting: Family Medicine

## 2019-03-28 MED FILL — LIVALO 2 MG TABLET: 2 | 90 days supply | Qty: 90 | Fill #0

## 2019-03-28 MED FILL — FUROSEMIDE 40 MG TAB: 40 | 90 days supply | Qty: 90 | Fill #0

## 2019-03-28 MED FILL — metFORMIN HCL 500 MG TABS: 500 | 90 days supply | Qty: 180 | Fill #0

## 2019-03-28 MED FILL — RAMIPRIL 10 MG CAPSULE: 10 | 90 days supply | Qty: 180 | Fill #0

## 2019-03-28 NOTE — Telephone Encounter (Signed)
Patient also needs livalo.  She had an A1C about a month back. Please advise on refills.

## 2019-03-30 ENCOUNTER — Telehealth: Payer: Self-pay | Admitting: Internal Medicine

## 2019-03-30 NOTE — Telephone Encounter (Signed)

## 2019-04-02 ENCOUNTER — Ambulatory Visit (AMBULATORY_SURGERY_CENTER): Payer: No Typology Code available for payment source | Admitting: Internal Medicine

## 2019-04-02 ENCOUNTER — Encounter: Payer: Self-pay | Admitting: Internal Medicine

## 2019-04-02 ENCOUNTER — Other Ambulatory Visit: Payer: Self-pay

## 2019-04-02 VITALS — BP 144/60 | HR 67 | Temp 98.1°F | Resp 9 | Ht 67.0 in | Wt 231.0 lb

## 2019-04-02 DIAGNOSIS — D122 Benign neoplasm of ascending colon: Secondary | ICD-10-CM

## 2019-04-02 DIAGNOSIS — D120 Benign neoplasm of cecum: Secondary | ICD-10-CM | POA: Diagnosis not present

## 2019-04-02 DIAGNOSIS — Z86010 Personal history of colonic polyps: Secondary | ICD-10-CM

## 2019-04-02 DIAGNOSIS — K648 Other hemorrhoids: Secondary | ICD-10-CM

## 2019-04-02 MED ORDER — SODIUM CHLORIDE 0.9 % IV SOLN: 500.0000 mL | Freq: Once | INTRAVENOUS | Status: DC

## 2019-04-02 NOTE — Op Note (Signed)
[place] Patient Name: [name] Procedure Date: 04/02/2019 11:25 AM MRN: [id number] Endoscopist: Jerene Bears , MD Age: 58 Referring MD:  Date of Birth: 01-24-1961 Gender: Female Account #: [id number] Procedure:                Colonoscopy Indications:              Abnormal CT of the GI tract suggesting an                            endoluminal soft tissue density near the ileocecal                            valve in May 2020; personal history of sessile                            serrated polyp x2 with last colonoscopy November                            2018 Medicines:                Monitored Anesthesia Care Procedure:                Pre-Anesthesia Assessment:                           - Prior to the procedure, a History and Physical                            was performed, and patient medications and                            allergies were reviewed. The patient's tolerance of                            previous anesthesia was also reviewed. The risks                            and benefits of the procedure and the sedation                            options and risks were discussed with the patient.                            All questions were answered, and informed consent                            was obtained. Prior Anticoagulants: The patient has                            taken no previous anticoagulant or antiplatelet                            agents. ASA Grade Assessment: II - A patient with  mild systemic disease. After reviewing the risks                            and benefits, the patient was deemed in                            satisfactory condition to undergo the procedure.                           After obtaining informed consent, the colonoscope                            was passed under direct vision. Throughout the                            procedure, the patient's blood pressure, pulse, and         oxygen saturations were monitored continuously. The                            Colonoscope was introduced through the anus and                            advanced to the terminal ileum. The colonoscopy was                            performed without difficulty. The patient tolerated                            the procedure well. The quality of the bowel                            preparation was good. The terminal ileum, ileocecal                            valve, appendiceal orifice, and rectum were                            photographed. Scope In: 11:35:06 AM Scope Out: 11:54:18 AM Scope Withdrawal Time: 0 hours 14 minutes 15 seconds  Total Procedure Duration: 0 hours 19 minutes 12 seconds  Findings:                 The terminal ileum appeared normal.                           A 6 mm polyp was found in the cecum. The polyp was                            sessile. The polyp was removed with a cold snare.                            Resection and retrieval were complete.  A 6 mm polyp was found in the ascending colon. The                            polyp was sessile. The polyp was removed with a                            cold snare. Resection and retrieval were complete.                           There is no endoscopic evidence of mass in the                            ascending colon, in the cecum and in the ileocecal                            valve.                           Internal hemorrhoids were found during retroflexion. Complications:            No immediate complications. Estimated Blood Loss:     Estimated blood loss was minimal. Impression:               - The examined portion of the ileum was normal.                           - One 6 mm polyp in the cecum, removed with a cold                            snare. Resected and retrieved.                           - One 6 mm polyp in the ascending colon, removed                            with a  cold snare. Resected and retrieved.                           - No evidence of endoluminal mass (as suggested by                            recent CT scan of the abdomen, this was likely the                            result of normal peristaltic activity at the                            ileocecal valve).                           - Internal hemorrhoids. Recommendation:           - Patient has a contact number available for  emergencies. The signs and symptoms of potential                            delayed complications were discussed with the                            patient. Return to normal activities tomorrow.                            Written discharge instructions were provided to the                            patient.                           - Resume previous diet.                           - Continue present medications.                           - Await pathology results.                           - Repeat colonoscopy is recommended for                            surveillance. The colonoscopy date will be                            determined after pathology results from today's                            exam become available for review. Jerene Bears, MD 04/02/2019 11:59:39 AM This report has been signed electronically.

## 2019-04-02 NOTE — Progress Notes (Signed)
Pt's states no medical or surgical changes since previsit or office visit. TA temp JB vital signs 

## 2019-04-02 NOTE — Patient Instructions (Signed)
YOU HAD AN ENDOSCOPIC PROCEDURE TODAY AT THE [facility]:   Refer to the procedure report that was given to you for any specific questions about what was found during the examination.  If the procedure report does not answer your questions, please call your gastroenterologist to clarify.  If you requested that your care partner not be given the details of your procedure findings, then the procedure report has been included in a sealed envelope for you to review at your convenience later.  YOU SHOULD EXPECT: Some feelings of bloating in the abdomen. Passage of more gas than usual.  Walking can help get rid of the air that was put into your GI tract during the procedure and reduce the bloating. If you had a lower endoscopy (such as a colonoscopy or flexible sigmoidoscopy) you may notice spotting of blood in your stool or on the toilet paper. If you underwent a bowel prep for your procedure, you may not have a normal bowel movement for a few days.  Please Note:  You might notice some irritation and congestion in your nose or some drainage.  This is from the oxygen used during your procedure.  There is no need for concern and it should clear up in a day or so.  SYMPTOMS TO REPORT IMMEDIATELY:   Following lower endoscopy (colonoscopy or flexible sigmoidoscopy):  Excessive amounts of blood in the stool  Significant tenderness or worsening of abdominal pains  Swelling of the abdomen that is new, acute  Fever of 100F or higher  For urgent or emergent issues, a gastroenterologist can be reached at any hour by calling [phone].   DIET:  We do recommend a small meal at first, but then you may proceed to your regular diet.  Drink plenty of fluids but you should avoid alcoholic beverages for 24 hours.  ACTIVITY:  You should plan to take it easy for the rest of today and you should NOT DRIVE or use heavy machinery until tomorrow (because of the sedation medicines used during the test).     FOLLOW UP: Our staff will call the number listed on your records 48-72 hours following your procedure to check on you and address any questions or concerns that you may have regarding the information given to you following your procedure. If we do not reach you, we will leave a message.  We will attempt to reach you two times.  During this call, we will ask if you have developed any symptoms of COVID 19. If you develop any symptoms (ie: fever, flu-like symptoms, shortness of breath, cough etc.) before then, please call [phone].  If you test positive for Covid 19 in the 2 weeks post procedure, please call and report this information to us.    If any biopsies were taken you will be contacted by phone or by letter within the next 1-3 weeks.  Please call us at [phone] if you have not heard about the biopsies in 3 weeks.    SIGNATURES/CONFIDENTIALITY: You and/or your care partner have signed paperwork which will be entered into your electronic medical record.  These signatures attest to the fact that that the information above on your After Visit Summary has been reviewed and is understood.  Full responsibility of the confidentiality of this discharge information lies with you and/or your care-partner. 

## 2019-04-02 NOTE — Progress Notes (Signed)
To PACU, VSS. Report to [email or web address] 

## 2019-04-04 ENCOUNTER — Telehealth: Payer: Self-pay

## 2019-04-04 NOTE — Telephone Encounter (Signed)
  Follow up Call-  Call back number 04/02/2019 08/24/2017  Post procedure Call Back phone  # [phone]  Permission to leave phone message Yes Yes  Some recent data might be hidden     Patient questions:  Do you have a fever, pain , or abdominal swelling? No. Pain Score  0 *  Have you tolerated food without any problems? Yes.    Have you been able to return to your normal activities? Yes.    Do you have any questions about your discharge instructions: Diet   No. Medications  No. Follow up visit  No.  Do you have questions or concerns about your Care? No.  Actions: * If pain score is 4 or above: No action needed, pain <4.  1. Have you developed a fever since your procedure? no  2.   Have you had an respiratory symptoms (SOB or cough) since your procedure? no  3.   Have you tested positive for COVID 19 since your procedure no  4.   Have you had any family members/close contacts diagnosed with the COVID 19 since your procedure?  no   If yes to any of these questions please route to Joylene John, RN and Alphonsa Gin, Therapist, sports.

## 2019-04-05 ENCOUNTER — Encounter: Payer: Self-pay | Admitting: Internal Medicine

## 2019-04-11 ENCOUNTER — Encounter: Payer: No Typology Code available for payment source | Admitting: Family Medicine

## 2019-06-05 ENCOUNTER — Other Ambulatory Visit: Payer: Self-pay

## 2019-06-19 MED FILL — FUROSEMIDE 40 MG TAB: 40 | 90 days supply | Qty: 90 | Fill #1

## 2019-06-19 MED FILL — metFORMIN HCL 500 MG TABS: 500 | 90 days supply | Qty: 180 | Fill #1

## 2019-06-19 MED FILL — RAMIPRIL 10 MG CAPSULE: 10 | 90 days supply | Qty: 180 | Fill #1

## 2019-09-03 ENCOUNTER — Ambulatory Visit (INDEPENDENT_AMBULATORY_CARE_PROVIDER_SITE_OTHER): Payer: No Typology Code available for payment source | Admitting: Family Medicine

## 2019-09-03 ENCOUNTER — Other Ambulatory Visit: Payer: Self-pay

## 2019-09-03 ENCOUNTER — Encounter: Payer: Self-pay | Admitting: Family Medicine

## 2019-09-03 ENCOUNTER — Ambulatory Visit (INDEPENDENT_AMBULATORY_CARE_PROVIDER_SITE_OTHER): Payer: No Typology Code available for payment source

## 2019-09-03 DIAGNOSIS — R0781 Pleurodynia: Secondary | ICD-10-CM | POA: Diagnosis not present

## 2019-09-03 DIAGNOSIS — H6690 Otitis media, unspecified, unspecified ear: Secondary | ICD-10-CM | POA: Diagnosis not present

## 2019-09-03 MED ORDER — AMOXICILLIN-POT CLAVULANATE 875-125 MG PO TABS: 1.0000 | ORAL_TABLET | Freq: Two times a day (BID) | ORAL | 0 refills | Status: DC

## 2019-09-03 NOTE — Progress Notes (Signed)
    Subjective:    Patient ID: Donnie Aho Cronk, female    DOB: 20-Apr-1961, 58 y.o.   MRN: [id number]   HPI: Shekia Filan Mullett is a 58 y.o. female presenting for earache. Right starting yesterday. Pressure. No ST. Feels pressure in ear with swallow. No fever. 98.4 this AM at home. Hurts at mastoid as well. Responds usually to antibiotics.    Depression screen [facility] 2/9 02/28/2019 02/16/2019 08/17/2018 07/04/2018 04/03/2018  Decreased Interest 0 0 0 0 0  Down, Depressed, Hopeless 0 0 0 0 0  PHQ - 2 Score 0 0 0 0 0  Altered sleeping 0 0 0 - -  Tired, decreased energy 0 0 0 - -  Change in appetite 0 0 0 - -  Feeling bad or failure about yourself  0 0 0 - -  Trouble concentrating 0 0 0 - -  Moving slowly or fidgety/restless 0 0 0 - -  Suicidal thoughts 0 0 0 - -  PHQ-9 Score 0 0 0 - -  Difficult doing work/chores - - Not difficult at all - -  Some recent data might be hidden     Relevant past medical, surgical, family and social history reviewed and updated as indicated.  Interim medical history since our last visit reviewed. Allergies and medications reviewed and updated.  ROS:  Review of Systems  Constitutional: Negative for activity change and fever.  HENT: Positive for ear pain. Negative for congestion and sinus pain.   Respiratory: Negative for cough and shortness of breath.   Gastrointestinal: Negative for diarrhea.     Social History   Tobacco Use  Smoking Status Never Smoker  Smokeless Tobacco Never Used  Tobacco Comment   AS A Teenager       Objective:     Wt Readings from Last 3 Encounters:  04/02/19 231 lb (104.8 kg)  03/21/19 231 lb (104.8 kg)  02/28/19 236 lb (107 kg)     Exam deferred. Pt. Harboring due to COVID 19. Phone visit performed.   Assessment & Plan:  No diagnosis found.  No orders of the defined types were placed in this encounter.   No orders of the defined types were placed in this encounter.     There are no diagnoses linked to this  encounter.  Virtual Visit via telephone Note  I discussed the limitations, risks, security and privacy concerns of performing an evaluation and management service by telephone and the availability of in person appointments. The patient was identified with two identifiers. Pt.expressed understanding and agreed to proceed. Pt. Is at work. Dr. Livia Snellen is in his office.  Follow Up Instructions:   I discussed the assessment and treatment plan with the patient. The patient was provided an opportunity to ask questions and all were answered. The patient agreed with the plan and demonstrated an understanding of the instructions.   The patient was advised to call back or seek an in-person evaluation if the symptoms worsen or if the condition fails to improve as anticipated.   Total minutes including chart review and phone contact time: 8   Follow up plan: No follow-ups on file.  Claretta Fraise, MD [place]

## 2019-09-04 ENCOUNTER — Other Ambulatory Visit: Payer: Self-pay | Admitting: Family Medicine

## 2019-09-04 DIAGNOSIS — S2242XA Multiple fractures of ribs, left side, initial encounter for closed fracture: Secondary | ICD-10-CM

## 2019-09-04 DIAGNOSIS — Z780 Asymptomatic menopausal state: Secondary | ICD-10-CM

## 2019-09-05 ENCOUNTER — Other Ambulatory Visit: Payer: Self-pay

## 2019-09-05 ENCOUNTER — Ambulatory Visit (INDEPENDENT_AMBULATORY_CARE_PROVIDER_SITE_OTHER): Payer: No Typology Code available for payment source | Admitting: Family Medicine

## 2019-09-05 VITALS — BP 134/76 | HR 83 | Temp 98.3°F

## 2019-09-05 DIAGNOSIS — H9201 Otalgia, right ear: Secondary | ICD-10-CM

## 2019-09-05 DIAGNOSIS — S2242XA Multiple fractures of ribs, left side, initial encounter for closed fracture: Secondary | ICD-10-CM

## 2019-09-05 DIAGNOSIS — H66001 Acute suppurative otitis media without spontaneous rupture of ear drum, right ear: Secondary | ICD-10-CM

## 2019-09-05 MED ORDER — METHYLPREDNISOLONE ACETATE 40 MG/ML IJ SUSP
40.0000 mg | Freq: Once | INTRAMUSCULAR | Status: AC
  Administered 2019-09-05: 40 mg via INTRAMUSCULAR

## 2019-09-05 MED ORDER — PREDNISONE 10 MG PO TABS: ORAL_TABLET | ORAL | 0 refills | Status: DC

## 2019-09-05 NOTE — Patient Instructions (Signed)
Keep taking the antibiotic.  Started prednisone tomorrow.  Take with breakfast.

## 2019-09-05 NOTE — Progress Notes (Signed)
Subjective: CC: Otalgia PCP: [name], DO [id number] [name] is a 58 y.o. female presenting to clinic today for:  1.  Otalgia Patient was treated for a middle ear infection with Augmentin.  She is currently on day 3 of the medication but has not noticed a great deal of improvement in her right-sided ear pain.  She describes the pain as throbbing and moderate to severe.  She has used over-the-counter analgesics without improvement.  Denies any fevers or drainage from the ears.  2.  Rib pain Patient recently found to have fractures of the left ribs 9 and 10.  These were nontraumatic fractures (occurred after reaching backwards) and we are planning for DEXA scan to evaluate for possible osteoporosis.   ROS: Per HPI  Allergies  Allergen Reactions  . Erythromycin Other (See Comments)    Abdominal pain  . Codeine Other (See Comments)    Hyper   . Sulfa Antibiotics Hives  . Tetracycline Rash   Past Medical History:  Diagnosis Date  . Allergy    seasonal  . Anxiety   . Arthritis   . Diabetes mellitus    type ii  . GERD (gastroesophageal reflux disease)   . Hiatal hernia   . Hyperlipidemia   . Hypertension   . Irritable bowel syndrome   . Obesity   . Sinus infection 08/14/2017   Symptoms resolved after antiobiotic course  . Tubular adenoma of colon 05/27/2015  . Ulcer     Current Outpatient Medications:  .  ALPRAZolam (XANAX) 0.5 MG tablet, Take 1 tablet (0.5 mg total) by mouth at bedtime as needed for sleep., Disp: 60 tablet, Rfl: 0 .  amoxicillin-clavulanate (AUGMENTIN) 875-125 MG tablet, Take 1 tablet by mouth 2 (two) times daily. Take all of this medication, Disp: 20 tablet, Rfl: 0 .  aspirin EC 81 MG tablet, Take 81 mg by mouth daily., Disp: , Rfl:  .  Biotin 10000 MCG TABS, Take by mouth., Disp: , Rfl:  .  furosemide (LASIX) 40 MG tablet, TAKE 1 TABLET BY MOUTH DAILY., Disp: 90 tablet, Rfl: 3 .  glucose blood (ONETOUCH VERIO) test strip, Check fasting blood  sugar 1X a day and prn    Dx 250.01, Disp: 300 each, Rfl: 1 .  hydrocortisone (ANUSOL-HC) 25 MG suppository, Place 1 suppository (25 mg total) rectally 2 (two) times daily., Disp: 12 suppository, Rfl: 0 .  metFORMIN (GLUCOPHAGE) 500 MG tablet, TAKE 1 TABLET BY MOUTH 2 TIMES DAILY., Disp: 180 tablet, Rfl: 3 .  omeprazole (PRILOSEC) 20 MG capsule, Take 20 mg by mouth daily., Disp: , Rfl:  .  ondansetron (ZOFRAN ODT) 8 MG disintegrating tablet, Take 1 tablet (8 mg total) by mouth every 8 (eight) hours as needed for nausea or vomiting., Disp: 20 tablet, Rfl: 0 .  Pitavastatin Calcium (LIVALO) 2 MG TABS, Take 1 tablet (2 mg total) by mouth at bedtime., Disp: 90 tablet, Rfl: 1 .  ramipril (ALTACE) 10 MG capsule, TAKE 2 CAPSULES BY MOUTH DAILY., Disp: 180 capsule, Rfl: 3 Social History   Socioeconomic History  . Marital status: Married    Spouse name: Not on file  . Number of children: Not on file  . Years of education: Not on file  . Highest education level: Not on file  Occupational History  . Not on file  Social Needs  . Financial resource strain: Not on file  . Food insecurity    Worry: Not on file    Inability: Not on  file  . Transportation needs    Medical: Not on file    Non-medical: Not on file  Tobacco Use  . Smoking status: Never Smoker  . Smokeless tobacco: Never Used  . Tobacco comment: AS A Teenager  Substance and Sexual Activity  . Alcohol use: Yes    Alcohol/week: 0.0 standard drinks    Comment:  occasional wine  . Drug use: No  . Sexual activity: Not on file  Lifestyle  . Physical activity    Days per week: Not on file    Minutes per session: Not on file  . Stress: Not on file  Relationships  . Social Herbalist on phone: Not on file    Gets together: Not on file    Attends religious service: Not on file    Active member of club or organization: Not on file    Attends meetings of clubs or organizations: Not on file    Relationship status: Not on file   . Intimate partner violence    Fear of current or ex partner: Not on file    Emotionally abused: Not on file    Physically abused: Not on file    Forced sexual activity: Not on file  Other Topics Concern  . Not on file  Social History Narrative  . Not on file   Family History  Problem Relation Age of Onset  . Diabetes Mother   . Kidney disease Mother   . Colitis Father   . Colon polyps Father   . Heart disease Father   . Irritable bowel syndrome Father   . Other Other        corkscrew esophagus- father, sister  . Crohn's disease Sister   . Colitis Sister   . Colon polyps Sister   . Ulcerative colitis Sister   . Diabetes Maternal Aunt   . Diabetes Paternal Aunt   . Diabetes Maternal Grandmother   . Diabetes Paternal Grandmother   . Heart disease Paternal Grandmother   . Colitis Paternal Grandfather   . Colon cancer Neg Hx   . Esophageal cancer Neg Hx   . Pancreatic cancer Neg Hx   . Rectal cancer Neg Hx   . Stomach cancer Neg Hx     Objective: Office vital signs reviewed. BP 134/76   Pulse 83   Temp 98.3 F (36.8 C) (Oral)   LMP 12/26/2014 (Approximate) Comment: 2-3 days  Physical Examination:  General: Awake, alert, well nourished, seems somewhat uncomfortable and holds her left side with position changes. HEENT: Normal    Neck: No masses palpated. No lymphadenopathy    Ears: Tympanic membranes on the right is opaque and there is no appreciable spurt of fluid level noted.  She has erythema centrally.  No bulging or perforation.  Assessment/ Plan: 58 y.o. female   1. Non-recurrent acute suppurative otitis media of right ear without spontaneous rupture of tympanic membrane Ear certainly has an infection I agree with use of the Augmentin.  She likely has poor eustachian tube drainage causing pressure and pain.  I will treat her with prednisone to help with the inflammation and pain.  She was given a dose by injection today and then instructed to start the oral  tomorrow.  She is to monitor her blood sugars, which have been previously controlled, closely given use of corticosteroids. - methylPREDNISolone acetate (DEPO-MEDROL) injection 40 mg - predniSONE (DELTASONE) 10 MG tablet; Take 60mg  by mouth day 1, 50mg  day 2, 40mg  day  3, 30mg  day 4, 20mg  day 5, 10mg  day 6.  Then stop.  Dispense: 21 tablet; Refill: 0  2. Right ear pain As above - methylPREDNISolone acetate (DEPO-MEDROL) injection 40 mg - predniSONE (DELTASONE) 10 MG tablet; Take 60mg  by mouth day 1, 50mg  day 2, 40mg  day 3, 30mg  day 4, 20mg  day 5, 10mg  day 6.  Then stop.  Dispense: 21 tablet; Refill: 0  3. Closed fracture of multiple ribs of left side, initial encounter Hopefully the steroids will also help with the pain she is experiencing. - methylPREDNISolone acetate (DEPO-MEDROL) injection 40 mg - predniSONE (DELTASONE) 10 MG tablet; Take 60mg  by mouth day 1, 50mg  day 2, 40mg  day 3, 30mg  day 4, 20mg  day 5, 10mg  day 6.  Then stop.  Dispense: 21 tablet; Refill: 0   No orders of the defined types were placed in this encounter.  No orders of the defined types were placed in this encounter.    [name], DO [place] [phone]

## 2019-09-06 ENCOUNTER — Other Ambulatory Visit: Payer: Self-pay | Admitting: Family Medicine

## 2019-09-06 ENCOUNTER — Telehealth: Payer: Self-pay | Admitting: Family Medicine

## 2019-09-06 ENCOUNTER — Telehealth: Payer: Self-pay | Admitting: *Deleted

## 2019-09-06 MED ORDER — FREESTYLE LITE TEST VI STRP: ORAL_STRIP | 3 refills | Status: DC

## 2019-09-06 MED ORDER — NEOMYCIN-POLYMYXIN-HC 3.5-10000-1 OT SUSP: 3.0000 [drp] | Freq: Four times a day (QID) | OTIC | 0 refills | Status: DC

## 2019-09-06 MED ORDER — FREESTYLE LANCETS MISC: 3 refills | Status: DC

## 2019-09-06 MED ORDER — NEOMYCIN-POLYMYXIN-HC 3.5-10000-1 OT SUSP: 3.0000 [drp] | Freq: Four times a day (QID) | OTIC | 0 refills | Status: AC

## 2019-09-06 MED ORDER — FREESTYLE FREEDOM LITE W/DEVICE KIT: PACK | 0 refills | Status: AC

## 2019-09-06 MED FILL — FREESTYLE FREEDOM LITE METE: W/DEVICE | 1 days supply | Qty: 1 | Fill #0

## 2019-09-06 MED FILL — FREESTYLE LANCETS: 90 days supply | Qty: 200 | Fill #0

## 2019-09-06 MED FILL — FREESTYLE LITE TEST STRIP: 90 days supply | Qty: 200 | Fill #0

## 2019-09-06 NOTE — Telephone Encounter (Signed)
Pt would like an antibiotic rx for ear drops called to [facility]. Her ear still feels full of fluid and pressure.

## 2019-09-06 NOTE — Telephone Encounter (Signed)
Pt aware Rx sent.  

## 2019-09-06 NOTE — Progress Notes (Signed)
Sent the prescription to [facility] for her instead.

## 2019-09-06 NOTE — Telephone Encounter (Signed)
Sent in antibiotic eardrops for the patient

## 2019-09-06 NOTE — Telephone Encounter (Signed)
Patient aware.

## 2019-09-10 ENCOUNTER — Ambulatory Visit: Payer: No Typology Code available for payment source | Admitting: Family Medicine

## 2019-09-17 ENCOUNTER — Other Ambulatory Visit: Payer: No Typology Code available for payment source

## 2019-09-17 ENCOUNTER — Other Ambulatory Visit: Payer: Self-pay | Admitting: Family Medicine

## 2019-09-17 ENCOUNTER — Other Ambulatory Visit: Payer: Self-pay

## 2019-09-17 ENCOUNTER — Ambulatory Visit (INDEPENDENT_AMBULATORY_CARE_PROVIDER_SITE_OTHER): Payer: No Typology Code available for payment source

## 2019-09-17 DIAGNOSIS — Z780 Asymptomatic menopausal state: Secondary | ICD-10-CM

## 2019-09-20 ENCOUNTER — Telehealth: Payer: Self-pay | Admitting: Family Medicine

## 2019-09-20 NOTE — Telephone Encounter (Signed)
Patients rt ear is still hurting. She would like to know if another round of antibiotics can be called in as well as a RX for Diflucan to [facility].

## 2019-09-21 ENCOUNTER — Other Ambulatory Visit: Payer: Self-pay

## 2019-09-21 ENCOUNTER — Other Ambulatory Visit: Payer: Self-pay | Admitting: Family Medicine

## 2019-09-21 MED ORDER — FLUCONAZOLE 150 MG PO TABS: 150.0000 mg | ORAL_TABLET | Freq: Once | ORAL | 0 refills | Status: DC

## 2019-09-21 MED ORDER — AMOXICILLIN-POT CLAVULANATE 875-125 MG PO TABS: 1.0000 | ORAL_TABLET | Freq: Two times a day (BID) | ORAL | 0 refills | Status: DC

## 2019-09-21 MED ORDER — FLUCONAZOLE 150 MG PO TABS: 150.0000 mg | ORAL_TABLET | Freq: Once | ORAL | 0 refills | Status: AC

## 2019-09-21 MED FILL — LIVALO 2 MG TABLET: 2 | 90 days supply | Qty: 90 | Fill #1

## 2019-09-21 MED FILL — FUROSEMIDE 40 MG TAB: 40 | 90 days supply | Qty: 90 | Fill #2

## 2019-09-21 MED FILL — RAMIPRIL 10 MG CAPSULE: 10 | 90 days supply | Qty: 180 | Fill #2

## 2019-09-21 MED FILL — metFORMIN HCL 500 MG TABS: 500 | 90 days supply | Qty: 180 | Fill #2

## 2019-09-21 NOTE — Telephone Encounter (Signed)
Rx accidentally sent to [facility].  Please call [place] to have transferred.

## 2019-10-23 ENCOUNTER — Other Ambulatory Visit: Payer: Self-pay

## 2019-10-24 ENCOUNTER — Encounter: Payer: Self-pay | Admitting: Family Medicine

## 2019-10-24 ENCOUNTER — Ambulatory Visit (INDEPENDENT_AMBULATORY_CARE_PROVIDER_SITE_OTHER): Payer: No Typology Code available for payment source | Admitting: Family Medicine

## 2019-10-24 VITALS — BP 112/75 | HR 84 | Temp 97.8°F | Ht 67.0 in | Wt 231.0 lb

## 2019-10-24 DIAGNOSIS — I152 Hypertension secondary to endocrine disorders: Secondary | ICD-10-CM

## 2019-10-24 DIAGNOSIS — I10 Essential (primary) hypertension: Secondary | ICD-10-CM

## 2019-10-24 DIAGNOSIS — M8580 Other specified disorders of bone density and structure, unspecified site: Secondary | ICD-10-CM

## 2019-10-24 DIAGNOSIS — E785 Hyperlipidemia, unspecified: Secondary | ICD-10-CM

## 2019-10-24 DIAGNOSIS — E1159 Type 2 diabetes mellitus with other circulatory complications: Secondary | ICD-10-CM

## 2019-10-24 DIAGNOSIS — L570 Actinic keratosis: Secondary | ICD-10-CM

## 2019-10-24 DIAGNOSIS — E1169 Type 2 diabetes mellitus with other specified complication: Secondary | ICD-10-CM | POA: Diagnosis not present

## 2019-10-24 DIAGNOSIS — E119 Type 2 diabetes mellitus without complications: Secondary | ICD-10-CM

## 2019-10-24 LAB — BAYER DCA HB A1C WAIVED: HB A1C (BAYER DCA - WAIVED): 5.8 % (ref <7.0)

## 2019-10-24 NOTE — Patient Instructions (Signed)
Your diabetic eye exam scheduled and have report sent to me

## 2019-10-24 NOTE — Progress Notes (Signed)
Subjective: CC: DM2 PCP: [name], DO BTD:HRCBUL B Tullo is a 59 y.o. female presenting to clinic today for:  1. Type 2 Diabetes w/ HTN, HLD:  Patient reports she has been trying to be more physically active and is walking much more now.  She is also trying to watch her diet.  Taking medication(s): Metformin 500 mg twice daily, Livalo 2 mg nightly, Altace 10 mg twice daily.  Last eye exam: Needs Last foot exam: needs Last A1c:  Lab Results  Component Value Date   HGBA1C 6.2 02/28/2019   Nephropathy screen indicated?: on ACE-I Last flu, zoster and/or pneumovax:  Immunization History  Administered Date(s) Administered  . Influenza Split 07/17/2013  . Influenza,inj,Quad PF,6+ Mos 07/10/2015, 07/05/2016, 06/30/2017, 07/12/2018, 07/23/2019  . Influenza-Unspecified 07/08/2014  . Pneumococcal Conjugate-13 11/30/2017    ROS: Denies dizziness, LOC, polyuria, polydipsia, unintended weight loss/gain, foot ulcerations, numbness or tingling in extremities, shortness of breath or chest pain.  2. Osteopenia Patient had her DEXA scan in December 2020.  Her T score was -1.5, indicating osteopenia.  However, what is confusing as she had this left-sided rib fracture without significant injury.  There is a strong suspicion for osteoporosis.  She is taking calcium and vitamin D in a senior multivitamin now.  She is exercising as above.  Rib pain is totally resolved.  3.  Skin lesion Patient reports an area on her chest that has been present for a while now.  It does not seem be necessarily getting bigger or changing color or shape but it is irritating.  Often itches and gets caught on her necklaces and clothing.  No spontaneous bleeding.  ROS: Per HPI  Allergies  Allergen Reactions  . Erythromycin Other (See Comments)    Abdominal pain  . Codeine Other (See Comments)    Hyper   . Sulfa Antibiotics Hives  . Tetracycline Rash   Past Medical History:  Diagnosis Date  . Allergy     seasonal  . Anxiety   . Arthritis   . Diabetes mellitus    type ii  . GERD (gastroesophageal reflux disease)   . Hiatal hernia   . Hyperlipidemia   . Hypertension   . Irritable bowel syndrome   . Obesity   . Sinus infection 08/14/2017   Symptoms resolved after antiobiotic course  . Tubular adenoma of colon 05/27/2015  . Ulcer     Current Outpatient Medications:  .  aspirin EC 81 MG tablet, Take 81 mg by mouth daily., Disp: , Rfl:  .  Biotin 10000 MCG TABS, Take by mouth., Disp: , Rfl:  .  Blood Glucose Monitoring Suppl (FREESTYLE FREEDOM LITE) w/Device KIT, Test blood sugar BID Dx E11.9, Disp: 1 kit, Rfl: 0 .  furosemide (LASIX) 40 MG tablet, TAKE 1 TABLET BY MOUTH DAILY., Disp: 90 tablet, Rfl: 3 .  glucose blood (FREESTYLE LITE) test strip, Test blood sugar BID Dx E11.9, Disp: 200 each, Rfl: 3 .  hydrocortisone (ANUSOL-HC) 25 MG suppository, Place 1 suppository (25 mg total) rectally 2 (two) times daily., Disp: 12 suppository, Rfl: 0 .  Lancets (FREESTYLE) lancets, Test blood sugar BID Dx E11.9, Disp: 200 each, Rfl: 3 .  metFORMIN (GLUCOPHAGE) 500 MG tablet, TAKE 1 TABLET BY MOUTH 2 TIMES DAILY., Disp: 180 tablet, Rfl: 3 .  multivitamin-iron-minerals-folic acid (CENTRUM) chewable tablet, Chew 1 tablet by mouth daily., Disp: , Rfl:  .  omeprazole (PRILOSEC) 20 MG capsule, Take 20 mg by mouth daily., Disp: , Rfl:  .  Pitavastatin Calcium (LIVALO) 2 MG TABS, Take 1 tablet (2 mg total) by mouth at bedtime., Disp: 90 tablet, Rfl: 1 .  ramipril (ALTACE) 10 MG capsule, TAKE 2 CAPSULES BY MOUTH DAILY., Disp: 180 capsule, Rfl: 3 .  ALPRAZolam (XANAX) 0.5 MG tablet, Take 1 tablet (0.5 mg total) by mouth at bedtime as needed for sleep. (Patient not taking: Reported on 10/24/2019), Disp: 60 tablet, Rfl: 0 Social History   Socioeconomic History  . Marital status: Married    Spouse name: Not on file  . Number of children: Not on file  . Years of education: Not on file  . Highest education  level: Not on file  Occupational History  . Not on file  Tobacco Use  . Smoking status: Never Smoker  . Smokeless tobacco: Never Used  . Tobacco comment: AS A Teenager  Substance and Sexual Activity  . Alcohol use: Yes    Alcohol/week: 0.0 standard drinks    Comment:  occasional wine  . Drug use: No  . Sexual activity: Not on file  Other Topics Concern  . Not on file  Social History Narrative  . Not on file   Social Determinants of Health   Financial Resource Strain:   . Difficulty of Paying Living Expenses: Not on file  Food Insecurity:   . Worried About Charity fundraiser in the Last Year: Not on file  . Ran Out of Food in the Last Year: Not on file  Transportation Needs:   . Lack of Transportation (Medical): Not on file  . Lack of Transportation (Non-Medical): Not on file  Physical Activity:   . Days of Exercise per Week: Not on file  . Minutes of Exercise per Session: Not on file  Stress:   . Feeling of Stress : Not on file  Social Connections:   . Frequency of Communication with Friends and Family: Not on file  . Frequency of Social Gatherings with Friends and Family: Not on file  . Attends Religious Services: Not on file  . Active Member of Clubs or Organizations: Not on file  . Attends Archivist Meetings: Not on file  . Marital Status: Not on file  Intimate Partner Violence:   . Fear of Current or Ex-Partner: Not on file  . Emotionally Abused: Not on file  . Physically Abused: Not on file  . Sexually Abused: Not on file   Family History  Problem Relation Age of Onset  . Diabetes Mother   . Kidney disease Mother   . Colitis Father   . Colon polyps Father   . Heart disease Father   . Irritable bowel syndrome Father   . Other Other        corkscrew esophagus- father, sister  . Crohn's disease Sister   . Colitis Sister   . Colon polyps Sister   . Ulcerative colitis Sister   . Diabetes Maternal Aunt   . Diabetes Paternal Aunt   . Diabetes  Maternal Grandmother   . Diabetes Paternal Grandmother   . Heart disease Paternal Grandmother   . Colitis Paternal Grandfather   . Colon cancer Neg Hx   . Esophageal cancer Neg Hx   . Pancreatic cancer Neg Hx   . Rectal cancer Neg Hx   . Stomach cancer Neg Hx     Objective: Office vital signs reviewed. BP 112/75   Pulse 84   Temp 97.8 F (36.6 C) (Oral)   Ht '5\' 7"'  (1.702 m)   Wt  231 lb (104.8 kg)   LMP 12/26/2014 (Approximate) Comment: 2-3 days  SpO2 99%   BMI 36.18 kg/m   Physical Examination:  General: Awake, alert, well nourished, No acute distress HEENT: There are white.  Moist mucous membranes. Cardio: regular rate and rhythm, S1S2 heard, no murmurs appreciated Pulm: clear to auscultation bilaterally, no wheezes, rhonchi or rales; normal work of breathing on room air Extremities: warm, well perfused, No edema, cyanosis or clubbing; +2 pulses bilaterally MSK: normal gait and station Skin: dry; she has a small hyperkeratotic lesion noted in the center of her chest anteriorly.  This is consistent with an actinic keratosis.  No pigmentation.  No bleeding or fluctuance. Neuro: see DM foot below  Diabetic Foot Exam - Simple   Simple Foot Form Diabetic Foot exam was performed with the following findings: Yes 10/24/2019  2:06 PM  Visual Inspection No deformities, no ulcerations, no other skin breakdown bilaterally: Yes Sensation Testing Intact to touch and monofilament testing bilaterally: Yes Pulse Check Posterior Tibialis and Dorsalis pulse intact bilaterally: Yes Comments    Cryotherapy Procedure:  Risks and benefits of procedure were reviewed with the patient.  Written consent obtained and scanned into the chart.  Lesion of concern was identified and located on chest.  Liquid nitrogen was applied to area of concern and extending out 1 millimeters beyond the border of the lesion.  Treated area was allowed to come back to room temperature before treating it a second  time.  Patient tolerated procedure well and there were no immediate complications.  Home care instructions were reviewed with the patient and a handout was provided.   Assessment/ Plan: 59 y.o. female   1. Controlled type 2 diabetes mellitus without complication, without long-term current use of insulin (HCC) Under excellent control with A1c down to 5.8 now.  Continue current regimen. - Bayer DCA Hb A1c Waived  2. Hypertension associated with diabetes ([place]) Controlled. - Basic Metabolic Panel  3. Hyperlipidemia associated with type 2 diabetes mellitus ([place]) Continue Livalo.  Plan for fasting lipid panel at her 16-monthfollow-up  4. Osteopenia, unspecified location I will reach out to Dr. BLayne Bentonfor her opinion on this patient.  I suspect that we need to leave her as osteoporosis over osteopenia given rib fractures.  5. Actinic keratosis Treated with cryotherapy today.  Home care instructions reviewed and she will follow as needed on this issue   Orders Placed This Encounter  Procedures  . Basic Metabolic Panel  . Bayer DCA Hb A1c Waived   No orders of the defined types were placed in this encounter.    AJanora Norlander DO [place]([phone]

## 2019-10-25 LAB — BASIC METABOLIC PANEL
BUN/Creatinine Ratio: 15 (ref 9–23)
BUN: 11 mg/dL (ref 6–24)
CO2: 30 mmol/L — ABNORMAL HIGH (ref 20–29)
Calcium: 10.1 mg/dL (ref 8.7–10.2)
Chloride: 97 mmol/L (ref 96–106)
Creatinine, Ser: 0.73 mg/dL (ref 0.57–1.00)
GFR calc Af Amer: 106 mL/min/{1.73_m2} (ref >59)
GFR calc non Af Amer: 92 mL/min/{1.73_m2} (ref >59)
Glucose: 103 mg/dL — ABNORMAL HIGH (ref 65–99)
Potassium: 3.9 mmol/L (ref 3.5–5.2)
Sodium: 140 mmol/L (ref 134–144)

## 2019-12-24 ENCOUNTER — Other Ambulatory Visit: Payer: Self-pay | Admitting: Family Medicine

## 2019-12-24 MED FILL — LIVALO 2 MG TABLET: 2 | 90 days supply | Qty: 90 | Fill #0

## 2019-12-24 MED FILL — FUROSEMIDE 40 MG TAB: 40 | 90 days supply | Qty: 90 | Fill #3

## 2019-12-24 MED FILL — METFORMIN HCL 500 MG TABS: 500 | 90 days supply | Qty: 180 | Fill #3

## 2019-12-24 MED FILL — RAMIPRIL 10 MG CAPSULE: 10 | 90 days supply | Qty: 180 | Fill #3

## 2020-03-17 ENCOUNTER — Other Ambulatory Visit: Payer: Self-pay | Admitting: Family Medicine

## 2020-03-17 MED FILL — METFORMIN HCL 500 MG TABS: 500 | 90 days supply | Qty: 180 | Fill #0

## 2020-03-17 MED FILL — RAMIPRIL 10 MG CAPSULE: 10 | 90 days supply | Qty: 180 | Fill #0

## 2020-03-17 MED FILL — FUROSEMIDE 40 MG TAB: 40 | 90 days supply | Qty: 90 | Fill #0

## 2020-04-28 ENCOUNTER — Other Ambulatory Visit: Payer: Self-pay | Admitting: Family Medicine

## 2020-04-28 DIAGNOSIS — E1169 Type 2 diabetes mellitus with other specified complication: Secondary | ICD-10-CM

## 2020-04-28 DIAGNOSIS — E1159 Type 2 diabetes mellitus with other circulatory complications: Secondary | ICD-10-CM

## 2020-04-28 DIAGNOSIS — E559 Vitamin D deficiency, unspecified: Secondary | ICD-10-CM

## 2020-04-28 DIAGNOSIS — M8580 Other specified disorders of bone density and structure, unspecified site: Secondary | ICD-10-CM

## 2020-04-28 DIAGNOSIS — E785 Hyperlipidemia, unspecified: Secondary | ICD-10-CM

## 2020-04-29 ENCOUNTER — Other Ambulatory Visit: Payer: No Typology Code available for payment source

## 2020-04-29 ENCOUNTER — Other Ambulatory Visit: Payer: Self-pay

## 2020-04-29 ENCOUNTER — Telehealth: Payer: Self-pay | Admitting: Family Medicine

## 2020-04-29 DIAGNOSIS — E785 Hyperlipidemia, unspecified: Secondary | ICD-10-CM

## 2020-04-29 DIAGNOSIS — E1169 Type 2 diabetes mellitus with other specified complication: Secondary | ICD-10-CM

## 2020-04-29 DIAGNOSIS — M8580 Other specified disorders of bone density and structure, unspecified site: Secondary | ICD-10-CM

## 2020-04-29 DIAGNOSIS — I152 Hypertension secondary to endocrine disorders: Secondary | ICD-10-CM

## 2020-04-29 DIAGNOSIS — E559 Vitamin D deficiency, unspecified: Secondary | ICD-10-CM

## 2020-04-29 LAB — BAYER DCA HB A1C WAIVED: HB A1C (BAYER DCA - WAIVED): 6.6 % (ref <7.0)

## 2020-04-29 NOTE — Telephone Encounter (Signed)
Pt would like to know HA1C results from today. Please call at [phone].

## 2020-04-29 NOTE — Telephone Encounter (Signed)
Patient aware and verbalized understanding. °

## 2020-04-30 ENCOUNTER — Ambulatory Visit (INDEPENDENT_AMBULATORY_CARE_PROVIDER_SITE_OTHER): Payer: No Typology Code available for payment source | Admitting: Family Medicine

## 2020-04-30 ENCOUNTER — Encounter: Payer: Self-pay | Admitting: Family Medicine

## 2020-04-30 VITALS — BP 117/70 | HR 76 | Temp 97.8°F | Ht 67.0 in | Wt 237.0 lb

## 2020-04-30 DIAGNOSIS — I10 Essential (primary) hypertension: Secondary | ICD-10-CM

## 2020-04-30 DIAGNOSIS — E1159 Type 2 diabetes mellitus with other circulatory complications: Secondary | ICD-10-CM

## 2020-04-30 DIAGNOSIS — E785 Hyperlipidemia, unspecified: Secondary | ICD-10-CM

## 2020-04-30 DIAGNOSIS — E559 Vitamin D deficiency, unspecified: Secondary | ICD-10-CM

## 2020-04-30 DIAGNOSIS — E1169 Type 2 diabetes mellitus with other specified complication: Secondary | ICD-10-CM | POA: Diagnosis not present

## 2020-04-30 DIAGNOSIS — M79671 Pain in right foot: Secondary | ICD-10-CM

## 2020-04-30 LAB — LIPID PANEL
Chol/HDL Ratio: 3.7 ratio (ref 0.0–4.4)
Cholesterol, Total: 169 mg/dL (ref 100–199)
HDL: 46 mg/dL (ref >39)
LDL Chol Calc (NIH): 92 mg/dL (ref 0–99)
Triglycerides: 182 mg/dL — ABNORMAL HIGH (ref 0–149)
VLDL Cholesterol Cal: 31 mg/dL (ref 5–40)

## 2020-04-30 LAB — CBC
Hematocrit: 41 % (ref 34.0–46.6)
Hemoglobin: 13.4 g/dL (ref 11.1–15.9)
MCH: 28.3 pg (ref 26.6–33.0)
MCHC: 32.7 g/dL (ref 31.5–35.7)
MCV: 87 fL (ref 79–97)
Platelets: 305 10*3/uL (ref 150–450)
RBC: 4.73 x10E6/uL (ref 3.77–5.28)
RDW: 12.7 % (ref 11.7–15.4)
WBC: 8.7 10*3/uL (ref 3.4–10.8)

## 2020-04-30 LAB — CMP14+EGFR
ALT: 36 IU/L — ABNORMAL HIGH (ref 0–32)
AST: 27 IU/L (ref 0–40)
Albumin/Globulin Ratio: 2 (ref 1.2–2.2)
Albumin: 4.6 g/dL (ref 3.8–4.9)
Alkaline Phosphatase: 72 IU/L (ref 48–121)
BUN/Creatinine Ratio: 12 (ref 9–23)
BUN: 9 mg/dL (ref 6–24)
Bilirubin Total: 0.3 mg/dL (ref 0.0–1.2)
CO2: 29 mmol/L (ref 20–29)
Calcium: 9.9 mg/dL (ref 8.7–10.2)
Chloride: 98 mmol/L (ref 96–106)
Creatinine, Ser: 0.76 mg/dL (ref 0.57–1.00)
GFR calc Af Amer: 100 mL/min/{1.73_m2} (ref >59)
GFR calc non Af Amer: 87 mL/min/{1.73_m2} (ref >59)
Globulin, Total: 2.3 g/dL (ref 1.5–4.5)
Glucose: 147 mg/dL — ABNORMAL HIGH (ref 65–99)
Potassium: 4.3 mmol/L (ref 3.5–5.2)
Sodium: 139 mmol/L (ref 134–144)
Total Protein: 6.9 g/dL (ref 6.0–8.5)

## 2020-04-30 LAB — VITAMIN D 25 HYDROXY (VIT D DEFICIENCY, FRACTURES): Vit D, 25-Hydroxy: 29.4 ng/mL — ABNORMAL LOW (ref 30.0–100.0)

## 2020-04-30 LAB — TSH: TSH: 1.74 u[IU]/mL (ref 0.450–4.500)

## 2020-04-30 NOTE — Progress Notes (Signed)
Subjective: CC: DM2 PCP: [name] Norlander, DO [name] is a 59 y.o. female presenting to clinic today for:  1. Type 2 Diabetes w/ HTN, HLD:  Patient reports she has not been active in several months now.  Taking medication(s): Metformin 500 mg twice daily, Livalo 2 mg nightly, Altace 10 mg twice daily.  Last eye exam: Needs Last foot exam: needs Last A1c:  Lab Results  Component Value Date   HGBA1C 6.6 04/29/2020   Nephropathy screen indicated?: on ACE-I Last flu, zoster and/or pneumovax:  Immunization History  Administered Date(s) Administered  . Influenza Split 07/17/2013  . Influenza,inj,Quad PF,6+ Mos 07/10/2015, 07/05/2016, 06/30/2017, 07/12/2018, 07/23/2019  . Influenza-Unspecified 07/08/2014  . Moderna SARS-COVID-2 Vaccination 01/29/2020, 02/25/2020  . Pneumococcal Conjugate-13 11/30/2017    ROS: Denies dizziness, LOC, polyuria, polydipsia, unintended weight loss/gain, foot ulcerations, numbness or tingling in extremities, shortness of breath or chest pain.  2. Foot pain Patient reports about a 3-week history of a right sided lateral foot pain.  She denies any preceding injury.  She wears very good shoes, usually [employer].  She reports slight soft tissue swelling.  Pain is present with certain movements.  No bruising.  She has been less physically active.  ROS: Per HPI  Allergies  Allergen Reactions  . Erythromycin Other (See Comments)    Abdominal pain  . Codeine Other (See Comments)    Hyper   . Sulfa Antibiotics Hives  . Tetracycline Rash   Past Medical History:  Diagnosis Date  . Allergy    seasonal  . Anxiety   . Arthritis   . Diabetes mellitus    type ii  . GERD (gastroesophageal reflux disease)   . Hiatal hernia   . Hyperlipidemia   . Hypertension   . Irritable bowel syndrome   . Obesity   . Sinus infection 08/14/2017   Symptoms resolved after antiobiotic course  . Tubular adenoma of colon 05/27/2015  . Ulcer     Current  Outpatient Medications:  .  aspirin EC 81 MG tablet, Take 81 mg by mouth daily., Disp: , Rfl:  .  Biotin 10000 MCG TABS, Take by mouth., Disp: , Rfl:  .  Blood Glucose Monitoring Suppl (FREESTYLE FREEDOM LITE) w/Device KIT, Test blood sugar BID Dx E11.9, Disp: 1 kit, Rfl: 0 .  furosemide (LASIX) 40 MG tablet, TAKE 1 TABLET BY MOUTH DAILY., Disp: 90 tablet, Rfl: 0 .  glucose blood (FREESTYLE LITE) test strip, Test blood sugar BID Dx E11.9, Disp: 200 each, Rfl: 3 .  hydrocortisone (ANUSOL-HC) 25 MG suppository, Place 1 suppository (25 mg total) rectally 2 (two) times daily., Disp: 12 suppository, Rfl: 0 .  Lancets (FREESTYLE) lancets, Test blood sugar BID Dx E11.9, Disp: 200 each, Rfl: 3 .  LIVALO 2 MG TABS, TAKE 1 TABLET BY MOUTH AT BEDTIME., Disp: 90 tablet, Rfl: 1 .  metFORMIN (GLUCOPHAGE) 500 MG tablet, TAKE 1 TABLET BY MOUTH 2 TIMES DAILY., Disp: 180 tablet, Rfl: 0 .  multivitamin-iron-minerals-folic acid (CENTRUM) chewable tablet, Chew 1 tablet by mouth daily., Disp: , Rfl:  .  omeprazole (PRILOSEC) 20 MG capsule, Take 20 mg by mouth daily., Disp: , Rfl:  .  ramipril (ALTACE) 10 MG capsule, TAKE 2 CAPSULES BY MOUTH DAILY., Disp: 180 capsule, Rfl: 0 .  ALPRAZolam (XANAX) 0.5 MG tablet, Take 1 tablet (0.5 mg total) by mouth at bedtime as needed for sleep. (Patient not taking: Reported on 10/24/2019), Disp: 60 tablet, Rfl: 0 Social History  Socioeconomic History  . Marital status: Married    Spouse name: Not on file  . Number of children: Not on file  . Years of education: Not on file  . Highest education level: Not on file  Occupational History  . Not on file  Tobacco Use  . Smoking status: Never Smoker  . Smokeless tobacco: Never Used  . Tobacco comment: AS A Teenager  Vaping Use  . Vaping Use: Never used  Substance and Sexual Activity  . Alcohol use: Yes    Alcohol/week: 0.0 standard drinks    Comment:  occasional wine  . Drug use: No  . Sexual activity: Not on file  Other  Topics Concern  . Not on file  Social History Narrative  . Not on file   Social Determinants of Health   Financial Resource Strain:   . Difficulty of Paying Living Expenses:   Food Insecurity:   . Worried About Charity fundraiser in the Last Year:   . Arboriculturist in the Last Year:   Transportation Needs:   . Film/video editor (Medical):   Marland Kitchen Lack of Transportation (Non-Medical):   Physical Activity:   . Days of Exercise per Week:   . Minutes of Exercise per Session:   Stress:   . Feeling of Stress :   Social Connections:   . Frequency of Communication with Friends and Family:   . Frequency of Social Gatherings with Friends and Family:   . Attends Religious Services:   . Active Member of Clubs or Organizations:   . Attends Archivist Meetings:   Marland Kitchen Marital Status:   Intimate Partner Violence:   . Fear of Current or Ex-Partner:   . Emotionally Abused:   Marland Kitchen Physically Abused:   . Sexually Abused:    Family History  Problem Relation Age of Onset  . Diabetes Mother   . Kidney disease Mother   . Colitis Father   . Colon polyps Father   . Heart disease Father   . Irritable bowel syndrome Father   . Other Other        corkscrew esophagus- father, sister  . Crohn's disease Sister   . Colitis Sister   . Colon polyps Sister   . Ulcerative colitis Sister   . Diabetes Maternal Aunt   . Diabetes Paternal Aunt   . Diabetes Maternal Grandmother   . Diabetes Paternal Grandmother   . Heart disease Paternal Grandmother   . Colitis Paternal Grandfather   . Colon cancer Neg Hx   . Esophageal cancer Neg Hx   . Pancreatic cancer Neg Hx   . Rectal cancer Neg Hx   . Stomach cancer Neg Hx     Objective: Office vital signs reviewed. BP 117/70   Pulse 76   Temp 97.8 F (36.6 C) (Temporal)   Ht '5\' 7"'  (1.702 m)   Wt (!) 237 lb (107.5 kg)   LMP 12/26/2014 (Approximate) Comment: 2-3 days  SpO2 98%   BMI 37.12 kg/m   Physical Examination:  General: Awake,  alert, well nourished, No acute distress HEENT: There are white.  Moist mucous membranes. Cardio: regular rate and rhythm, S1S2 heard, no murmurs appreciated Pulm: clear to auscultation bilaterally, no wheezes, rhonchi or rales; normal work of breathing on room air Extremities: warm, well perfused, No edema, cyanosis or clubbing; +2 pulses bilaterally MSK: normal gait and station Skin: dry; she has a small hyperkeratotic lesion noted in the center of her chest anteriorly.  This is consistent with an actinic keratosis.  No pigmentation.  No bleeding or fluctuance. Neuro: see DM foot below  Diabetic Foot Exam - Simple   Simple Foot Form Diabetic Foot exam was performed with the following findings: Yes 04/30/2020  2:01 PM  Visual Inspection No deformities, no ulcerations, no other skin breakdown bilaterally: Yes Sensation Testing Intact to touch and monofilament testing bilaterally: Yes Pulse Check Posterior Tibialis and Dorsalis pulse intact bilaterally: Yes Comments     Assessment/ Plan: 58 y.o. female   1. Controlled type 2 diabetes mellitus with other specified complication, without long-term current use of insulin (HCC) Slight rise in A1c. Still controlled. No changes needed  2. Hypertension associated with diabetes ([place]) controlled  3. Hyperlipidemia associated with type 2 diabetes mellitus ([place]) Continue Livalo  4. Vitamin D deficiency OTC Vit D 800IU daily  5. Right foot pain Likely due to foot strain/ cuboid syndrome.  Recommended being put in shoes that get her to a neurtral state.  Offered referral to [facility] for custom inserts but wants to try fleet feet/ shoe shoes    No orders of the defined types were placed in this encounter.  No orders of the defined types were placed in this encounter.  Reviewed labs with patient today.  [name] Norlander, DO [place] [phone]

## 2020-05-23 ENCOUNTER — Other Ambulatory Visit: Payer: Self-pay

## 2020-05-23 ENCOUNTER — Ambulatory Visit (INDEPENDENT_AMBULATORY_CARE_PROVIDER_SITE_OTHER): Payer: No Typology Code available for payment source

## 2020-05-23 ENCOUNTER — Telehealth: Payer: Self-pay | Admitting: Family Medicine

## 2020-05-23 ENCOUNTER — Encounter: Payer: Self-pay | Admitting: Family Medicine

## 2020-05-23 ENCOUNTER — Ambulatory Visit (INDEPENDENT_AMBULATORY_CARE_PROVIDER_SITE_OTHER): Payer: No Typology Code available for payment source | Admitting: Family Medicine

## 2020-05-23 DIAGNOSIS — M79672 Pain in left foot: Secondary | ICD-10-CM

## 2020-05-23 NOTE — Progress Notes (Signed)
      Subjective:   Patient ID: [name], female    DOB: 1961/05/28, 59 y.o.   MRN: [id number]  HPI: [name] is a 59 y.o. female presenting on 05/23/2020 for Foot Pain (left foot)   HPI Patient is coming in complaining of left foot pain on the dorsum of her left foot about mid foot that has been going on for about a week.  She says she has been taking ibuprofen and trying to Dr. Tawni Pummel day at home but it just is not getting better and it hurts when she stands on it and she feels like her ankle rotates more.  It does hurt more when she stands on it or walks on it.  She says is just not getting better.  Relevant past medical, surgical, family and social history reviewed and updated as indicated. Interim medical history since our last visit reviewed. Allergies and medications reviewed and updated.  Review of Systems  Musculoskeletal: Positive for arthralgias and myalgias.    Per HPI unless specifically indicated above      Objective:   LMP 12/26/2014 (Approximate) Comment: 2-3 days  Wt Readings from Last 3 Encounters:  04/30/20 (!) 237 lb (107.5 kg)  10/24/19 231 lb (104.8 kg)  04/02/19 231 lb (104.8 kg)    Physical Exam Vitals and nursing note reviewed.  Constitutional:      General: She is not in acute distress.    Appearance: She is well-developed. She is not diaphoretic.  Musculoskeletal:        General: Normal range of motion.     Left foot: Normal capillary refill. Swelling, tenderness and bony tenderness present. No Charcot foot, foot drop or crepitus. Normal pulse.       Legs:  Skin:    General: Skin is warm and dry.     Findings: No rash.  Neurological:     Mental Status: She is alert.     Foot x-ray: There is a small calcification or bony fragment in the tendon, and no fractures that I can note but await final read from radiologist.  Assessment & Plan:   Problem List Items Addressed This Visit    None    Visit Diagnoses    Foot pain, left     -  Primary   Relevant Orders   DG Foot Complete Left       Follow up plan: Return if symptoms worsen or fail to improve.  Counseling provided for all of the vaccine components Orders Placed This Encounter  Procedures  . DG Foot Complete Left    Caryl Pina, MD [facility] 05/23/2020, 11:06 AM

## 2020-05-23 NOTE — Telephone Encounter (Signed)
Spoke with pt and scheduled in the after hours today and she is aware.

## 2020-06-11 ENCOUNTER — Other Ambulatory Visit: Payer: Self-pay | Admitting: Family Medicine

## 2020-06-11 MED FILL — LIVALO 2 MG TABLET: 2 | 90 days supply | Qty: 90 | Fill #1

## 2020-06-12 MED FILL — METFORMIN HCL 500 MG TABS: 500 | 90 days supply | Qty: 180 | Fill #0

## 2020-06-12 MED FILL — RAMIPRIL 10 MG CAPSULE: 10 | 90 days supply | Qty: 180 | Fill #0

## 2020-06-12 MED FILL — FUROSEMIDE 40 MG TAB: 40 | 90 days supply | Qty: 90 | Fill #0

## 2020-07-25 ENCOUNTER — Ambulatory Visit: Payer: No Typology Code available for payment source | Admitting: Physician Assistant

## 2020-07-29 ENCOUNTER — Ambulatory Visit: Payer: No Typology Code available for payment source | Admitting: Physician Assistant

## 2020-08-05 ENCOUNTER — Encounter: Payer: Self-pay | Admitting: Family Medicine

## 2020-08-05 ENCOUNTER — Ambulatory Visit (INDEPENDENT_AMBULATORY_CARE_PROVIDER_SITE_OTHER): Payer: No Typology Code available for payment source | Admitting: Family Medicine

## 2020-08-05 ENCOUNTER — Other Ambulatory Visit: Payer: Self-pay

## 2020-08-05 VITALS — BP 138/76 | HR 87 | Temp 97.5°F | Ht 67.0 in | Wt 237.8 lb

## 2020-08-05 DIAGNOSIS — M654 Radial styloid tenosynovitis [de Quervain]: Secondary | ICD-10-CM | POA: Diagnosis not present

## 2020-08-05 NOTE — Progress Notes (Signed)
Subjective: CC: wrist pain PCP: [name], DO [name] is a 59 y.o. female presenting to clinic today for:  1. Wrist pain Patient reports couple month history of right-sided wrist pain.  She points to the radial styloid area as the area of pain.  Denies any preceding injury.  She is right-hand dominant.  Pain is exacerbated by writing motions.  She also spends quite a bit of time on the computer.  Does not report any changes in sensation or weakness necessarily but she does feel that there is some soft tissue swelling.  Ice does seem to make symptoms feel somewhat better.  Motrin 400 to 600 mg twice daily also seems to help some.   ROS: Per HPI  Allergies  Allergen Reactions   Erythromycin Other (See Comments)    Abdominal pain   Codeine Other (See Comments)    Hyper    Sulfa Antibiotics Hives   Tetracycline Rash   Past Medical History:  Diagnosis Date   Allergy    seasonal   Anxiety    Arthritis    Diabetes mellitus    type ii   GERD (gastroesophageal reflux disease)    Hiatal hernia    Hyperlipidemia    Hypertension    Irritable bowel syndrome    Obesity    Sinus infection 08/14/2017   Symptoms resolved after antiobiotic course   Tubular adenoma of colon 05/27/2015   Ulcer     Current Outpatient Medications:    ALPRAZolam (XANAX) 0.5 MG tablet, Take 1 tablet (0.5 mg total) by mouth at bedtime as needed for sleep. (Patient not taking: Reported on 10/24/2019), Disp: 60 tablet, Rfl: 0   aspirin EC 81 MG tablet, Take 81 mg by mouth daily., Disp: , Rfl:    Biotin 10000 MCG TABS, Take by mouth., Disp: , Rfl:    Blood Glucose Monitoring Suppl (FREESTYLE FREEDOM LITE) w/Device KIT, Test blood sugar BID Dx E11.9, Disp: 1 kit, Rfl: 0   furosemide (LASIX) 40 MG tablet, TAKE 1 TABLET BY MOUTH DAILY., Disp: 90 tablet, Rfl: 0   glucose blood (FREESTYLE LITE) test strip, Test blood sugar BID Dx E11.9, Disp: 200 each, Rfl: 3    hydrocortisone (ANUSOL-HC) 25 MG suppository, Place 1 suppository (25 mg total) rectally 2 (two) times daily., Disp: 12 suppository, Rfl: 0   Lancets (FREESTYLE) lancets, Test blood sugar BID Dx E11.9, Disp: 200 each, Rfl: 3   LIVALO 2 MG TABS, TAKE 1 TABLET BY MOUTH AT BEDTIME., Disp: 90 tablet, Rfl: 1   metFORMIN (GLUCOPHAGE) 500 MG tablet, TAKE 1 TABLET BY MOUTH 2 TIMES DAILY., Disp: 180 tablet, Rfl: 0   multivitamin-iron-minerals-folic acid (CENTRUM) chewable tablet, Chew 1 tablet by mouth daily., Disp: , Rfl:    omeprazole (PRILOSEC) 20 MG capsule, Take 20 mg by mouth daily., Disp: , Rfl:    ramipril (ALTACE) 10 MG capsule, TAKE 2 CAPSULES BY MOUTH DAILY., Disp: 180 capsule, Rfl: 0 Social History   Socioeconomic History   Marital status: Married    Spouse name: Not on file   Number of children: Not on file   Years of education: Not on file   Highest education level: Not on file  Occupational History   Not on file  Tobacco Use   Smoking status: Never Smoker   Smokeless tobacco: Never Used   Tobacco comment: AS A Teenager  Vaping Use   Vaping Use: Never used  Substance and Sexual Activity   Alcohol use: Yes  Alcohol/week: 0.0 standard drinks    Comment:  occasional wine   Drug use: No   Sexual activity: Not on file  Other Topics Concern   Not on file  Social History Narrative   Not on file   Social Determinants of Health   Financial Resource Strain:    Difficulty of Paying Living Expenses: Not on file  Food Insecurity:    Worried About [place] in the Last Year: Not on file   Ran Out of Food in the Last Year: Not on file  Transportation Needs:    Lack of Transportation (Medical): Not on file   Lack of Transportation (Non-Medical): Not on file  Physical Activity:    Days of Exercise per Week: Not on file   Minutes of Exercise per Session: Not on file  Stress:    Feeling of Stress : Not on file  Social Connections:     Frequency of Communication with Friends and Family: Not on file   Frequency of Social Gatherings with Friends and Family: Not on file   Attends Religious Services: Not on file   Active Member of Clubs or Organizations: Not on file   Attends Archivist Meetings: Not on file   Marital Status: Not on file  Intimate Partner Violence:    Fear of Current or Ex-Partner: Not on file   Emotionally Abused: Not on file   Physically Abused: Not on file   Sexually Abused: Not on file   Family History  Problem Relation Age of Onset   Diabetes Mother    Kidney disease Mother    Colitis Father    Colon polyps Father    Heart disease Father    Irritable bowel syndrome Father    Other Other        corkscrew esophagus- father, sister   Crohn's disease Sister    Colitis Sister    Colon polyps Sister    Ulcerative colitis Sister    Diabetes Maternal Aunt    Diabetes Paternal Aunt    Diabetes Maternal Grandmother    Diabetes Paternal Grandmother    Heart disease Paternal Grandmother    Colitis Paternal Grandfather    Colon cancer Neg Hx    Esophageal cancer Neg Hx    Pancreatic cancer Neg Hx    Rectal cancer Neg Hx    Stomach cancer Neg Hx     Objective: Office vital signs reviewed. BP 138/76    Pulse 87    Temp (!) 97.5 F (36.4 C)    Ht 5' 7" (1.702 m)    Wt 237 lb 12.8 oz (107.9 kg)    LMP 12/26/2014 (Approximate) Comment: 2-3 days   SpO2 99%    BMI 37.24 kg/m   Physical Examination:  General: Awake, alert, well nourished, No acute distress MSK:   Right wrist: Patient with mild soft tissue swelling noted.  She has a very positive Finkelstein test.  +tenderness palpation over the affected area.  Assessment/ Plan: 59 y.o. female   De Quervain's tenosynovitis, right  Physical exam clinically consistent with deeper veins tenosynovitis.  We discussed home care treatments.  She would like to continue with conservative therapy.  Okay to continue  oral NSAID up to 3 times daily if needed.  Ice to the affected area.  Home physical therapy exercises provided.  If no significant improvement within the next couple of months, could consider corticosteroid injection into this area.  She voiced good understanding of the plan and  will follow up as needed  No orders of the defined types were placed in this encounter.  No orders of the defined types were placed in this encounter.    [name], DO [place] [phone]

## 2020-08-05 NOTE — Patient Instructions (Signed)
De Quervain's Tenosynovitis  De Quervain's tenosynovitis is a condition that causes inflammation of the tendon on the thumb side of the wrist. Tendons are cords of tissue that connect bones to muscles. The tendons in the hand pass through a tunnel called a sheath. A slippery layer of tissue (synovium) lets the tendons move smoothly in the sheath. With de Quervain's tenosynovitis, the sheath swells or thickens, causing friction and pain. The condition is also called de Quervain's disease and de Quervain's syndrome. It occurs most often in women who are 30-50 years old. What are the causes? The exact cause of this condition is not known. It may be associated with overuse of the hand and wrist. What increases the risk? You are more likely to develop this condition if you:  Use your hands far more than normal, especially if you repeat certain movements that involve twisting your hand or using a tight grip.  Are pregnant.  Are a middle-aged woman.  Have rheumatoid arthritis.  Have diabetes. What are the signs or symptoms? The main symptom of this condition is pain on the thumb side of the wrist. The pain may get worse when you grasp something or turn your wrist. Other symptoms may include:  Pain that extends up the forearm.  Swelling of your wrist and hand.  Trouble moving the thumb and wrist.  A sensation of snapping in the wrist.  A bump filled with fluid (cyst) in the area of the pain. How is this diagnosed? This condition may be diagnosed based on:  Your symptoms and medical history.  A physical exam. During the exam, your health care provider may do a simple test (Finkelstein test) that involves pulling your thumb and wrist to see if this causes pain. You may also need to have an X-ray. How is this treated? Treatment for this condition may include:  Avoiding any activity that causes pain and swelling.  Taking medicines. Anti-inflammatory medicines and corticosteroid  injections may be used to reduce inflammation and relieve pain.  Wearing a splint.  Having surgery. This may be needed if other treatments do not work. Once the pain and swelling has gone down:  Physical therapy. This includes stretching and strengthening exercises.  Occupational therapy. This includes adjusting how you move your wrist. Follow these instructions at home: If you have a splint:  Wear the splint as told by your health care provider. Remove it only as told by your health care provider.  Loosen the splint if your fingers tingle, become numb, or turn cold and blue.  Keep the splint clean.  If the splint is not waterproof: ? Do not let it get wet. ? Cover it with a watertight covering when you take a bath or a shower. Managing pain, stiffness, and swelling   Avoid movements and activities that cause pain and swelling in the wrist area.  If directed, put ice on the painful area. This may be helpful after doing activities that involve the sore wrist. ? Put ice in a plastic bag. ? Place a towel between your skin and the bag. ? Leave the ice on for 20 minutes, 2-3 times a day.  Move your fingers often to avoid stiffness and to lessen swelling.  Raise (elevate) the injured area above the level of your heart while you are sitting or lying down. General instructions  Return to your normal activities as told by your health care provider. Ask your health care provider what activities are safe for you.  Take over-the-counter   and prescription medicines only as told by your health care provider.  Keep all follow-up visits as told by your health care provider. This is important. Contact a health care provider if:  Your pain medicine does not help.  Your pain gets worse.  You develop new symptoms. Summary  De Quervain's tenosynovitis is a condition that causes inflammation of the tendon on the thumb side of the wrist.  The condition occurs most often in women who are  30-50 years old.  The exact cause of this condition is not known. It may be associated with overuse of the hand and wrist.  Treatment starts with avoiding activity that causes pain or swelling in the wrist area. Other treatment may include wearing a splint and taking medicine. Sometimes, surgery is needed. This information is not intended to replace advice given to you by your health care provider. Make sure you discuss any questions you have with your health care provider. Document Revised: 03/23/2018 Document Reviewed: 08/29/2017 Elsevier Patient Education  2020 [employer]  

## 2020-08-21 ENCOUNTER — Telehealth (INDEPENDENT_AMBULATORY_CARE_PROVIDER_SITE_OTHER): Payer: No Typology Code available for payment source | Admitting: Family Medicine

## 2020-08-21 ENCOUNTER — Encounter: Payer: Self-pay | Admitting: Family Medicine

## 2020-08-21 ENCOUNTER — Telehealth: Payer: No Typology Code available for payment source

## 2020-08-21 DIAGNOSIS — R399 Unspecified symptoms and signs involving the genitourinary system: Secondary | ICD-10-CM | POA: Diagnosis not present

## 2020-08-21 LAB — URINALYSIS, COMPLETE
Bilirubin, UA: NEGATIVE
Glucose, UA: NEGATIVE
Ketones, UA: NEGATIVE
Leukocytes,UA: NEGATIVE
Nitrite, UA: NEGATIVE
Protein,UA: NEGATIVE
RBC, UA: NEGATIVE
Specific Gravity, UA: 1.02 (ref 1.005–1.030)
Urobilinogen, Ur: 0.2 mg/dL (ref 0.2–1.0)
pH, UA: 7 (ref 5.0–7.5)

## 2020-08-21 LAB — MICROSCOPIC EXAMINATION

## 2020-08-21 MED ORDER — CIPROFLOXACIN HCL 500 MG PO TABS: 500.0000 mg | ORAL_TABLET | Freq: Two times a day (BID) | ORAL | 0 refills | Status: DC

## 2020-08-21 NOTE — Progress Notes (Signed)
Subjective:    Patient ID: [name], female    DOB: 03/02/1961, 59 y.o.   MRN: [id number]   HPI: [name] is a 59 y.o. female presenting for a couple of days of urgency, frequency and feeling of not emptying completely. Hx of pyelo in the past. Denies fever. No nausea. She typically has flank pain with her cystitis. She took macrodantin prophylactically for many years as result she no longer gets relief from that antibiotic.  She is noted to have multiple antibiotic allergies as well.  She tells me that Cipro seems to work best for her or the [place].  Patient tells me that she is not having a lot of burning yet.   Depression screen [facility] 2/9 08/05/2020 04/30/2020 10/24/2019 02/28/2019 02/16/2019  Decreased Interest 0 0 0 0 0  Down, Depressed, Hopeless 0 0 0 0 0  PHQ - 2 Score 0 0 0 0 0  Altered sleeping - 0 0 0 0  Tired, decreased energy - 0 0 0 0  Change in appetite - 0 0 0 0  Feeling bad or failure about yourself  - 0 0 0 0  Trouble concentrating - 0 0 0 0  Moving slowly or fidgety/restless - 0 0 0 0  Suicidal thoughts - 0 0 0 0  PHQ-9 Score - 0 0 0 0  Difficult doing work/chores - - - - -  Some recent data might be hidden     Relevant past medical, surgical, family and social history reviewed and updated as indicated.  Interim medical history since our last visit reviewed. Allergies and medications reviewed and updated.  ROS:  Review of Systems  Constitutional: Negative for fever.  Genitourinary: Positive for flank pain, frequency and urgency.     Social History   Tobacco Use  Smoking Status Never Smoker  Smokeless Tobacco Never Used  Tobacco Comment   AS A Teenager       Objective:     Wt Readings from Last 3 Encounters:  08/05/20 237 lb 12.8 oz (107.9 kg)  04/30/20 (!) 237 lb (107.5 kg)  10/24/19 231 lb (104.8 kg)     Exam deferred. Pt. Harboring due to COVID 19. Phone visit performed.   Assessment & Plan:   1. UTI symptoms     Meds ordered  this encounter  Medications  . ciprofloxacin (CIPRO) 500 MG tablet    Sig: Take 1 tablet (500 mg total) by mouth 2 (two) times daily.    Dispense:  14 tablet    Refill:  0    Orders Placed This Encounter  Procedures  . Urine Culture    Standing Status:   Future    Number of Occurrences:   1    Standing Expiration Date:   09/20/2020  . Microscopic Examination  . Urinalysis, Complete      Diagnoses and all orders for this visit:  UTI symptoms -     Urinalysis, Complete -     Urine Culture; Future -     Urine Culture  Other orders -     Microscopic Examination -     ciprofloxacin (CIPRO) 500 MG tablet; Take 1 tablet (500 mg total) by mouth 2 (two) times daily.    Virtual Visit via telephone Note  I discussed the limitations, risks, security and privacy concerns of performing an evaluation and management service by telephone and the availability of in person appointments. The patient was identified with two identifiers. Pt.expressed understanding  and agreed to proceed. Pt. Is at home. Dr. Livia Snellen is in his office.  Follow Up Instructions:   I discussed the assessment and treatment plan with the patient. The patient was provided an opportunity to ask questions and all were answered. The patient agreed with the plan and demonstrated an understanding of the instructions.   The patient was advised to call back or seek an in-person evaluation if the symptoms worsen or if the condition fails to improve as anticipated.   Total minutes including chart review and phone contact time: 8   Follow up plan: No follow-ups on file.  Claretta Fraise, MD [place]

## 2020-08-23 LAB — URINE CULTURE

## 2020-09-15 ENCOUNTER — Other Ambulatory Visit: Payer: Self-pay | Admitting: Family Medicine

## 2020-09-15 LAB — HM DIABETES EYE EXAM

## 2020-09-15 MED FILL — METFORMIN HCL 500 MG TABS: 500 | 90 days supply | Qty: 180 | Fill #0

## 2020-09-15 MED FILL — FUROSEMIDE 40 MG TAB: 40 | 90 days supply | Qty: 90 | Fill #0

## 2020-09-15 MED FILL — RAMIPRIL 10 MG CAPSULE: 10 | 90 days supply | Qty: 180 | Fill #0

## 2020-09-15 MED FILL — LIVALO 2 MG TABLET: 2 | 90 days supply | Qty: 90 | Fill #0

## 2020-10-10 ENCOUNTER — Other Ambulatory Visit: Payer: Self-pay

## 2020-10-10 ENCOUNTER — Ambulatory Visit (INDEPENDENT_AMBULATORY_CARE_PROVIDER_SITE_OTHER): Payer: No Typology Code available for payment source | Admitting: Physician Assistant

## 2020-10-10 ENCOUNTER — Encounter: Payer: Self-pay | Admitting: Physician Assistant

## 2020-10-10 DIAGNOSIS — D1801 Hemangioma of skin and subcutaneous tissue: Secondary | ICD-10-CM

## 2020-10-10 DIAGNOSIS — L821 Other seborrheic keratosis: Secondary | ICD-10-CM | POA: Diagnosis not present

## 2020-10-10 DIAGNOSIS — B079 Viral wart, unspecified: Secondary | ICD-10-CM

## 2020-10-10 DIAGNOSIS — Q825 Congenital non-neoplastic nevus: Secondary | ICD-10-CM

## 2020-10-10 DIAGNOSIS — Z1283 Encounter for screening for malignant neoplasm of skin: Secondary | ICD-10-CM

## 2020-10-17 ENCOUNTER — Encounter: Payer: Self-pay | Admitting: Physician Assistant

## 2020-10-17 NOTE — Progress Notes (Signed)
   New Patient   Subjective  [name] is a 60 y.o. female who presents for the following: New Patient (Initial Visit) (Patient here today for yearly skin check. No concerns).   The following portions of the chart were reviewed this encounter and updated as appropriate:  Tobacco  Allergies  Meds  Problems  Med Hx  Surg Hx  Fam Hx      Objective  Well appearing patient in no apparent distress; mood and affect are within normal limits.  A full examination was performed including scalp, head, eyes, ears, nose, lips, neck, chest, axillae, abdomen, back, buttocks, bilateral upper extremities, bilateral lower extremities, hands, feet, fingers, toes, fingernails, and toenails. All findings within normal limits unless otherwise noted below.  Objective  Head - Anterior (Face): Head to toe exam today  Objective  Right Upper Back: Verrucous papules -- Discussed viral etiology and contagion.   Objective  Neck - Anterior: Multiple red papules  Objective  Left Thigh - Anterior and scattered trunk: Stuck-on, waxy papules and plaques.   Objective  Left Melolabial Fold (2), Philtrum: Raised soft papule  Assessment & Plan  Skin exam for malignant neoplasm Head - Anterior (Face)  Yearly Skin Check  Viral warts, unspecified type Right Upper Back  observe  Hemangioma of skin Neck - Anterior  Benign okay to leave.  Seborrheic keratosis Left Thigh - Anterior and scattered trunk  Benign okay to leave. 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 The documentation on 10/17/20 for the exam, diagnosis, procedures, and orders are all accurate and complete.

## 2020-10-29 ENCOUNTER — Ambulatory Visit: Payer: No Typology Code available for payment source

## 2020-11-05 ENCOUNTER — Ambulatory Visit (INDEPENDENT_AMBULATORY_CARE_PROVIDER_SITE_OTHER): Payer: No Typology Code available for payment source | Admitting: Family Medicine

## 2020-11-05 VITALS — BP 127/73 | HR 76 | Temp 98.7°F | Ht 67.0 in | Wt 232.0 lb

## 2020-11-05 DIAGNOSIS — E1169 Type 2 diabetes mellitus with other specified complication: Secondary | ICD-10-CM

## 2020-11-05 DIAGNOSIS — Z23 Encounter for immunization: Secondary | ICD-10-CM

## 2020-11-05 DIAGNOSIS — E785 Hyperlipidemia, unspecified: Secondary | ICD-10-CM

## 2020-11-05 DIAGNOSIS — E1159 Type 2 diabetes mellitus with other circulatory complications: Secondary | ICD-10-CM

## 2020-11-05 DIAGNOSIS — M654 Radial styloid tenosynovitis [de Quervain]: Secondary | ICD-10-CM

## 2020-11-05 DIAGNOSIS — K639 Disease of intestine, unspecified: Secondary | ICD-10-CM

## 2020-11-05 DIAGNOSIS — I152 Hypertension secondary to endocrine disorders: Secondary | ICD-10-CM

## 2020-11-05 LAB — BAYER DCA HB A1C WAIVED: HB A1C (BAYER DCA - WAIVED): 6.5 % (ref <7.0)

## 2020-11-05 NOTE — Progress Notes (Signed)
Subjective: CC: DM PCP: [name], DO VHQ:IONGEX [name] is a 60 y.o. female presenting to clinic today for:  1. Type 2 Diabetes with hypertension, hyperlipidemia:  Reports that she has not been totally compliant with her diet lately, particularly over the holidays.  She has been compliant with her medications however.  No reports of visual disturbance, chest pain, shortness of breath, edema.  She had quite a bit of issues with her GI system about a week ago where she thinks she may have had volvulus.  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  She has had total resolution of her abdominal pain and bowel movements have been normal.  She is using Senokot   Last eye exam: Up-to-date Last foot exam: Up-to-date Last A1c:  Lab Results  Component Value Date   HGBA1C 6.6 04/29/2020   Nephropathy screen indicated?:  On ACE inhibitor Last flu, zoster and/or pneumovax:  Immunization History  Administered Date(s) Administered  . Influenza Split 07/17/2013  . Influenza,inj,Quad PF,6+ Mos 07/10/2015, 07/05/2016, 06/30/2017, 07/12/2018, 07/23/2019  . Influenza-Unspecified 07/08/2014  . Moderna Sars-Covid-2 Vaccination 01/29/2020, 02/25/2020  . Pneumococcal Conjugate-13 11/30/2017   2.  De Quervain's tenosynovitis, right Ongoing issues with this.  She notes it is pretty much stable from previous.  She uses ibuprofen intermittently and has been performing the physical therapy exercises and it does help when she utilizes them but she is hesitant to use ibuprofen frequently due to GI upset.   ROS: Per HPI  Allergies  Allergen Reactions  . Erythromycin Other (See Comments)    Abdominal pain  . Codeine Other (See Comments)    Hyper   . Sulfa Antibiotics Hives  . Tetracycline Rash   Past Medical History:  Diagnosis Date  . Allergy    seasonal  . Anxiety   . Arthritis   . Diabetes mellitus    type ii  . GERD (gastroesophageal reflux disease)    . Hiatal hernia   . Hyperlipidemia   . Hypertension   . Irritable bowel syndrome   . Obesity   . Sinus infection 08/14/2017   Symptoms resolved after antiobiotic course  . Tubular adenoma of colon 05/27/2015  . Ulcer     Current Outpatient Medications:  .  ALPRAZolam (XANAX) 0.5 MG tablet, Take 1 tablet (0.5 mg total) by mouth at bedtime as needed for sleep., Disp: 60 tablet, Rfl: 0 .  aspirin EC 81 MG tablet, Take 81 mg by mouth daily., Disp: , Rfl:  .  Biotin 10000 MCG TABS, Take by mouth., Disp: , Rfl:  .  Blood Glucose Monitoring Suppl (FREESTYLE FREEDOM LITE) w/Device KIT, Test blood sugar BID Dx E11.9, Disp: 1 kit, Rfl: 0 .  ciprofloxacin (CIPRO) 500 MG tablet, Take 1 tablet (500 mg total) by mouth 2 (two) times daily., Disp: 14 tablet, Rfl: 0 .  furosemide (LASIX) 40 MG tablet, TAKE 1 TABLET BY MOUTH DAILY., Disp: 90 tablet, Rfl: 0 .  glucose blood (FREESTYLE LITE) test strip, Test blood sugar BID Dx E11.9, Disp: 200 each, Rfl: 3 .  hydrocortisone (ANUSOL-HC) 25 MG suppository, Place 1 suppository (25 mg total) rectally 2 (two) times daily., Disp: 12 suppository, Rfl: 0 .  Lancets (FREESTYLE) lancets, Test blood sugar BID Dx E11.9, Disp: 200 each, Rfl: 3 .  LIVALO 2 MG TABS, TAKE 1 TABLET BY MOUTH AT BEDTIME., Disp: 90 tablet, Rfl: 0 .  metFORMIN (GLUCOPHAGE) 500 MG tablet, TAKE 1 TABLET  BY MOUTH 2 TIMES DAILY., Disp: 180 tablet, Rfl: 0 .  multivitamin-iron-minerals-folic acid (CENTRUM) chewable tablet, Chew 1 tablet by mouth daily., Disp: , Rfl:  .  omeprazole (PRILOSEC) 20 MG capsule, Take 20 mg by mouth daily., Disp: , Rfl:  .  ramipril (ALTACE) 10 MG capsule, TAKE 2 CAPSULES BY MOUTH DAILY., Disp: 180 capsule, Rfl: 0 Social History   Socioeconomic History  . Marital status: Married    Spouse name: Not on file  . Number of children: Not on file  . Years of education: Not on file  . Highest education level: Not on file  Occupational History  . Not on file  Tobacco Use   . Smoking status: Never Smoker  . Smokeless tobacco: Never Used  . Tobacco comment: AS A Teenager  Vaping Use  . Vaping Use: Never used  Substance and Sexual Activity  . Alcohol use: Yes    Alcohol/week: 0.0 standard drinks    Comment:  occasional wine  . Drug use: No  . Sexual activity: Not on file  Other Topics Concern  . Not on file  Social History Narrative  . Not on file   Social Determinants of Health   Financial Resource Strain: Not on file  Food Insecurity: Not on file  Transportation Needs: Not on file  Physical Activity: Not on file  Stress: Not on file  Social Connections: Not on file  Intimate Partner Violence: Not on file   Family History  Problem Relation Age of Onset  . Diabetes Mother   . Kidney disease Mother   . Colitis Father   . Colon polyps Father   . Heart disease Father   . Irritable bowel syndrome Father   . Squamous cell carcinoma Father   . Other Other        corkscrew esophagus- father, sister  . Crohn's disease Sister   . Colitis Sister   . Colon polyps Sister   . Ulcerative colitis Sister   . Diabetes Maternal Aunt   . Diabetes Paternal Aunt   . Diabetes Maternal Grandmother   . Diabetes Paternal Grandmother   . Heart disease Paternal Grandmother   . Colitis Paternal Grandfather   . Colon cancer Neg Hx   . Esophageal cancer Neg Hx   . Pancreatic cancer Neg Hx   . Rectal cancer Neg Hx   . Stomach cancer Neg Hx     Objective: Office vital signs reviewed. BP 127/73   Pulse 76   Temp 98.7 F (37.1 C) (Temporal)   Ht '5\' 7"'  (1.702 m)   Wt 232 lb (105.2 kg)   LMP 12/26/2014 (Approximate) Comment: 2-3 days  SpO2 100%   BMI 36.34 kg/m   Physical Examination:  General: Awake, alert, well nourished, No acute distress HEENT: Normal sclera white.  No carotid bruits Cardio: regular rate and rhythm, S1S2 heard, no murmurs appreciated Pulm: clear to auscultation bilaterally, no wheezes, rhonchi or rales; normal work of breathing on  room air GI: soft, non-tender, non-distended, bowel sounds present x4, no hepatomegaly, no splenomegaly, no masses Extremities: warm, well perfused, No edema, cyanosis or clubbing; +2 pulses bilaterally MSK: normal gait and station   Assessment/ Plan: 60 y.o. female   Controlled type 2 diabetes mellitus with other specified complication, without long-term current use of insulin ([place]) - Plan: Bayer DCA Hb A1c Waived  Hypertension associated with diabetes ([place])  Hyperlipidemia associated with type 2 diabetes mellitus (H[facility])  De Quervain's tenosynovitis, right  Disorder of cecum  Sugar controlled with A1c of 6.5 today.  Continue current regimen  Blood pressure controlled.  Continue current regimen  Continue statin  D. Quervain's tenosynovitis is stable.  Offered corticosteroid injection but she declined this today.  Offered Celebrex and she will consider.  Continue PPI and NSAID as needed  Not currently having any abdominal pain but has had issues over the last week.  She is modifying diet.  She will alert me if symptoms return  No orders of the defined types were placed in this encounter.  No orders of the defined types were placed in this encounter.    [name], DO [place] [phone]

## 2020-12-15 ENCOUNTER — Other Ambulatory Visit: Payer: Self-pay | Admitting: Family Medicine

## 2020-12-23 ENCOUNTER — Other Ambulatory Visit (HOSPITAL_BASED_OUTPATIENT_CLINIC_OR_DEPARTMENT_OTHER): Payer: Self-pay

## 2020-12-26 ENCOUNTER — Other Ambulatory Visit: Payer: Self-pay | Admitting: Family Medicine

## 2020-12-26 DIAGNOSIS — M67449 Ganglion, unspecified hand: Secondary | ICD-10-CM

## 2021-02-26 ENCOUNTER — Other Ambulatory Visit (HOSPITAL_COMMUNITY): Payer: Self-pay

## 2021-03-18 ENCOUNTER — Other Ambulatory Visit: Payer: Self-pay | Admitting: Family Medicine

## 2021-03-18 ENCOUNTER — Other Ambulatory Visit (HOSPITAL_COMMUNITY): Payer: Self-pay

## 2021-03-18 MED ORDER — METFORMIN HCL 500 MG PO TABS
ORAL_TABLET | Freq: Two times a day (BID) | ORAL | 0 refills | Status: DC
  Filled 2021-03-18: qty 180, 90d supply, fill #0

## 2021-03-18 MED FILL — Pitavastatin Calcium Tab 2 MG: ORAL | 90 days supply | Qty: 90 | Fill #0 | Status: AC

## 2021-03-18 MED FILL — Furosemide Tab 40 MG: ORAL | 90 days supply | Qty: 90 | Fill #0 | Status: AC

## 2021-03-18 MED FILL — Ramipril Cap 10 MG: ORAL | 90 days supply | Qty: 180 | Fill #0 | Status: AC

## 2021-03-24 ENCOUNTER — Other Ambulatory Visit (HOSPITAL_COMMUNITY): Payer: Self-pay

## 2021-03-24 MED ORDER — CARESTART COVID-19 HOME TEST VI KIT
PACK | 0 refills | Status: DC
  Filled 2021-03-24 – 2021-04-23 (×2): qty 4, 4d supply, fill #0

## 2021-04-01 ENCOUNTER — Other Ambulatory Visit (HOSPITAL_COMMUNITY): Payer: Self-pay

## 2021-04-08 ENCOUNTER — Encounter: Payer: Self-pay | Admitting: Nurse Practitioner

## 2021-04-08 ENCOUNTER — Ambulatory Visit (INDEPENDENT_AMBULATORY_CARE_PROVIDER_SITE_OTHER): Payer: No Typology Code available for payment source | Admitting: Nurse Practitioner

## 2021-04-08 ENCOUNTER — Other Ambulatory Visit (HOSPITAL_COMMUNITY): Payer: Self-pay

## 2021-04-08 DIAGNOSIS — J069 Acute upper respiratory infection, unspecified: Secondary | ICD-10-CM | POA: Diagnosis not present

## 2021-04-08 DIAGNOSIS — R059 Cough, unspecified: Secondary | ICD-10-CM | POA: Insufficient documentation

## 2021-04-08 MED ORDER — PREDNISONE 10 MG (21) PO TBPK
ORAL_TABLET | ORAL | 0 refills | Status: DC
  Filled 2021-04-08: qty 21, 6d supply, fill #0

## 2021-04-08 MED ORDER — ACETAMINOPHEN 500 MG PO TABS
500.0000 mg | ORAL_TABLET | Freq: Four times a day (QID) | ORAL | 0 refills | Status: DC | PRN
  Filled 2021-04-08: qty 30, 8d supply, fill #0

## 2021-04-08 MED ORDER — DM-GUAIFENESIN ER 30-600 MG PO TB12
1.0000 | ORAL_TABLET | Freq: Two times a day (BID) | ORAL | 0 refills | Status: DC
  Filled 2021-04-08: qty 30, 15d supply, fill #0

## 2021-04-08 NOTE — Progress Notes (Signed)
   Virtual Visit  Note Due to COVID-19 pandemic this visit was conducted virtually. This visit type was conducted due to national recommendations for restrictions regarding the COVID-19 Pandemic (e.g. social distancing, sheltering in place) in an effort to limit this patient's exposure and mitigate transmission in our community. All issues noted in this document were discussed and addressed.  A physical exam was not performed with this format.  I connected with [name] on 04/08/21 at 8:43 AM by telephone and verified that I am speaking with the correct person using two identifiers. [name] is currently located at home during visit. The provider, Ivy Lynn, NP is located in their office at time of visit.  I discussed the limitations, risks, security and privacy concerns of performing an evaluation and management service by telephone and the availability of in person appointments. I also discussed with the patient that there may be a patient responsible charge related to this service. The patient expressed understanding and agreed to proceed.   History and Present Illness:  Cough This is a new problem. The current episode started yesterday. The problem has been unchanged. The problem occurs constantly. The cough is Productive of sputum. Pertinent negatives include no chest pain, chills, ear congestion, ear pain, fever, heartburn, hemoptysis or sore throat. Associated symptoms comments: Chest congestion. Nothing aggravates the symptoms. She has tried nothing for the symptoms. Her past medical history is significant for bronchitis.     Review of Systems  Constitutional:  Positive for malaise/fatigue. Negative for chills and fever.  HENT:  Negative for ear pain and sore throat.   Respiratory:  Positive for cough. Negative for hemoptysis.   Cardiovascular:  Negative for chest pain.  Gastrointestinal:  Negative for heartburn.  All other systems reviewed and are  negative.   Observations/Objective: Televisit patient not in distress  Assessment and Plan:  Patient has completed a COVID test outside of clinic waiting for results. Tylenol for headache/body aches/fever, prednisone taper, guaifenesin for cough and congestion, increase hydration.  Follow Up Instructions: Follow-up with worsening unresolved symptoms.    I discussed the assessment and treatment plan with the patient. The patient was provided an opportunity to ask questions and all were answered. The patient agreed with the plan and demonstrated an understanding of the instructions.   The patient was advised to call back or seek an in-person evaluation if the symptoms worsen or if the condition fails to improve as anticipated.  The above assessment and management plan was discussed with the patient. The patient verbalized understanding of and has agreed to the management plan. Patient is aware to call the clinic if symptoms persist or worsen. Patient is aware when to return to the clinic for a follow-up visit. Patient educated on when it is appropriate to go to the emergency department.   Time call ended: 8:50 AM  I provided 7 minutes of  non face-to-face time during this encounter.    Ivy Lynn, NP

## 2021-04-08 NOTE — Assessment & Plan Note (Signed)
Patient has completed a COVID test outside of clinic waiting for results. Tylenol for headache/body aches/fever, prednisone taper, guaifenesin for cough and congestion, increase hydration.

## 2021-04-09 ENCOUNTER — Telehealth: Payer: Self-pay | Admitting: Family Medicine

## 2021-04-09 MED ORDER — AMOXICILLIN-POT CLAVULANATE 875-125 MG PO TABS: 1.0000 | ORAL_TABLET | Freq: Two times a day (BID) | ORAL | 0 refills | Status: DC

## 2021-04-09 NOTE — Telephone Encounter (Signed)
Augmentin Prescription sent to pharmacy

## 2021-04-09 NOTE — Telephone Encounter (Signed)
Patient aware and verbalized understanding. °

## 2021-04-09 NOTE — Telephone Encounter (Signed)
Treating physical is OFF PCP OFF Covering PCP - please advise

## 2021-04-09 NOTE — Telephone Encounter (Signed)
Pt called Covid test was negative but she has a lot of congestion and bad cough and would like Augmentin called into [facility].

## 2021-04-10 ENCOUNTER — Other Ambulatory Visit (HOSPITAL_COMMUNITY): Payer: Self-pay

## 2021-04-10 ENCOUNTER — Ambulatory Visit: Payer: No Typology Code available for payment source | Admitting: Nurse Practitioner

## 2021-04-21 ENCOUNTER — Encounter: Payer: Self-pay | Admitting: Family Medicine

## 2021-04-21 ENCOUNTER — Ambulatory Visit (INDEPENDENT_AMBULATORY_CARE_PROVIDER_SITE_OTHER): Payer: No Typology Code available for payment source | Admitting: Family Medicine

## 2021-04-21 VITALS — BP 132/73 | HR 93 | Temp 98.2°F | Ht 67.0 in | Wt 241.2 lb

## 2021-04-21 DIAGNOSIS — J22 Unspecified acute lower respiratory infection: Secondary | ICD-10-CM

## 2021-04-21 DIAGNOSIS — E119 Type 2 diabetes mellitus without complications: Secondary | ICD-10-CM

## 2021-04-21 DIAGNOSIS — B9689 Other specified bacterial agents as the cause of diseases classified elsewhere: Secondary | ICD-10-CM

## 2021-04-21 MED ORDER — LEVOFLOXACIN 500 MG PO TABS: 500.0000 mg | ORAL_TABLET | Freq: Every day | ORAL | 0 refills | Status: DC

## 2021-04-21 MED ORDER — BETAMETHASONE SOD PHOS & ACET 6 (3-3) MG/ML IJ SUSP
6.0000 mg | Freq: Once | INTRAMUSCULAR | Status: AC
  Administered 2021-04-21: 6 mg via INTRAMUSCULAR

## 2021-04-21 NOTE — Progress Notes (Signed)
Subjective:  Patient ID: [name], female    DOB: 08-31-1961  Age: 60 y.o. MRN: [id number]  CC: Cough   HPI [name] presents for Persistent cough in spite of treatment. Took augmentin and prednisone taper. Felt much better at the end, about 1 week ago. Then  noticed sx returning about 4 days ago. Cough has returned and she is producing white mucous. Not dyspneic. Heat feels oppressive.  At first was severe enough that she missed three days of work. No chest pain. Minimal upper resppiratory involvement. She feels that if she could just reach in her throat and grab, whatever is in there she would feel much better. Pt. Is a diabetic. Reports steroid taper caused glucose to climb up to 270 on one occasion.  Depression screen [facility] 2/9 04/21/2021 11/05/2020 08/05/2020  Decreased Interest 0 0 0  Down, Depressed, Hopeless 0 0 0  PHQ - 2 Score 0 0 0  Altered sleeping - 0 -  Tired, decreased energy - 0 -  Change in appetite - 0 -  Feeling bad or failure about yourself  - 0 -  Trouble concentrating - 0 -  Moving slowly or fidgety/restless - 0 -  Suicidal thoughts - 0 -  PHQ-9 Score - 0 -  Difficult doing work/chores - - -  Some recent data might be hidden    History Sui has a past medical history of Allergy, Anxiety, Arthritis, Diabetes mellitus, GERD (gastroesophageal reflux disease), Hiatal hernia, Hyperlipidemia, Hypertension, Irritable bowel syndrome, Obesity, Sinus infection (08/14/2017), Tubular adenoma of colon (05/27/2015), and Ulcer.   She has a past surgical history that includes Cholecystectomy (2009); Colonoscopy; and Polypectomy.   Her family history includes Colitis in her father, paternal grandfather, and sister; Colon polyps in her father and sister; Crohn's disease in her sister; Diabetes in her maternal aunt, maternal grandmother, mother, paternal aunt, and paternal grandmother; Heart disease in her father and paternal grandmother; Irritable bowel syndrome in her  father; Kidney disease in her mother; Other in an other family member; Squamous cell carcinoma in her father; Ulcerative colitis in her sister.She reports that she has never smoked. She has never used smokeless tobacco. She reports current alcohol use. She reports that she does not use drugs.    ROS Review of Systems  Constitutional: Negative.   HENT:  Positive for postnasal drip. Negative for congestion, rhinorrhea, sinus pressure, sneezing, sore throat (a little bit on the left this morning) and trouble swallowing.   Eyes:  Negative for visual disturbance.  Respiratory:  Positive for cough. Negative for shortness of breath.    Objective:  BP 132/73   Pulse 93   Temp 98.2 F (36.8 C)   Ht _0  (1.702 m)   Wt 241 lb 3.2 oz (109.4 kg)   LMP 12/26/2014 (Approximate) Comment: 2-3 days  SpO2 96%   BMI 37.78 kg/m   BP Readings from Last 3 Encounters:  04/21/21 132/73  11/05/20 127/73  08/05/20 138/76    Wt Readings from Last 3 Encounters:  04/21/21 241 lb 3.2 oz (109.4 kg)  11/05/20 232 lb (105.2 kg)  08/05/20 237 lb 12.8 oz (107.9 kg)     Physical Exam Constitutional:      General: She is not in acute distress.    Appearance: Normal appearance. She is not toxic-appearing.  HENT:     Head: Normocephalic.     Right Ear: Tympanic membrane and ear canal normal.     Left Ear: Tympanic membrane  and ear canal normal.     Nose: No congestion or rhinorrhea.     Mouth/Throat:     Mouth: Mucous membranes are moist.     Pharynx: Posterior oropharyngeal erythema (mild erythematous drainage at gutters laterally) present.  Eyes:     Conjunctiva/sclera: Conjunctivae normal.     Pupils: Pupils are equal, round, and reactive to light.  Cardiovascular:     Rate and Rhythm: Normal rate and regular rhythm.     Heart sounds: No murmur heard. Pulmonary:     Effort: No respiratory distress.     Breath sounds: Wheezing (mild, with diminished respiratory exchange) present. No rhonchi.   Musculoskeletal:     Cervical back: Neck supple.  Lymphadenopathy:     Cervical: No cervical adenopathy.  Neurological:     Mental Status: She is alert.      Assessment & Plan:   Vincent was seen today for cough.  Diagnoses and all orders for this visit:  Bacterial lower respiratory infection -     levofloxacin (LEVAQUIN) 500 MG tablet; Take 1 tablet (500 mg total) by mouth daily. For 10 days -     betamethasone acetate-betamethasone sodium phosphate (CELESTONE) injection 6 mg  Controlled type 2 diabetes mellitus without complication, without long-term current use of insulin ([place])      I have discontinued Florabel B. Scheuring's ciprofloxacin, predniSONE, and amoxicillin-clavulanate. I am also having her start on levofloxacin. Additionally, I am having her maintain her aspirin EC, ALPRAZolam, hydrocortisone, Biotin, omeprazole, FreeStyle Freedom Lite, freestyle, FREESTYLE LITE, multivitamin-iron-minerals-folic acid, Pitavastatin Calcium, furosemide, ramipril, metFORMIN, Carestart COVID-19 Home Test, dextromethorphan-guaiFENesin, and acetaminophen. We administered betamethasone acetate-betamethasone sodium phosphate.  Allergies as of 04/21/2021       Reactions   Erythromycin Other (See Comments)   Abdominal pain   Codeine Other (See Comments)   Hyper    Sulfa Antibiotics Hives   Tetracycline Rash        Medication List        Accurate as of April 21, 2021  5:40 PM. If you have any questions, ask your nurse or doctor.          STOP taking these medications    amoxicillin-clavulanate 875-125 MG tablet Commonly known as: AUGMENTIN Stopped by: Claretta Fraise, MD   ciprofloxacin 500 MG tablet Commonly known as: Cipro Stopped by: Claretta Fraise, MD   predniSONE 10 MG (21) Tbpk tablet Commonly known as: STERAPRED UNI-PAK 21 TAB Stopped by: Claretta Fraise, MD       TAKE these medications    acetaminophen 500 MG tablet Commonly known as: TYLENOL Take 1 tablet (500  mg total) by mouth every 6 (six) hours as needed.   ALPRAZolam 0.5 MG tablet Commonly known as: Xanax Take 1 tablet (0.5 mg total) by mouth at bedtime as needed for sleep.   aspirin EC 81 MG tablet Take 81 mg by mouth daily.   Biotin 10000 MCG Tabs Take by mouth.   Carestart COVID-19 Home Test Kit Generic drug: COVID-19 At Home Antigen Test Use as directed   dextromethorphan-guaiFENesin 30-600 MG 12hr tablet Commonly known as: MUCINEX DM Take 1 tablet by mouth 2 (two) times daily.   FreeStyle Freedom Lite w/Device Kit Test blood sugar BID Dx E11.9   freestyle lancets Test blood sugar BID Dx E11.9   FREESTYLE LITE test strip Generic drug: glucose blood Test blood sugar BID Dx E11.9   furosemide 40 MG tablet Commonly known as: LASIX TAKE 1 TABLET BY MOUTH DAILY.  hydrocortisone 25 MG suppository Commonly known as: ANUSOL-HC Place 1 suppository (25 mg total) rectally 2 (two) times daily.   levofloxacin 500 MG tablet Commonly known as: LEVAQUIN Take 1 tablet (500 mg total) by mouth daily. For 10 days Started by: Claretta Fraise, MD   Livalo 2 MG Tabs Generic drug: Pitavastatin Calcium TAKE 1 TABLET BY MOUTH AT BEDTIME.   metFORMIN 500 MG tablet Commonly known as: GLUCOPHAGE TAKE 1 TABLET BY MOUTH 2 TIMES DAILY   multivitamin-iron-minerals-folic acid chewable tablet Chew 1 tablet by mouth daily.   omeprazole 20 MG capsule Commonly known as: PRILOSEC Take 20 mg by mouth daily.   ramipril 10 MG capsule Commonly known as: ALTACE TAKE 2 CAPSULES BY MOUTH ONCE A DAY         Follow-up: Return if symptoms worsen or fail to improve.  Claretta Fraise, M.D.

## 2021-04-23 ENCOUNTER — Other Ambulatory Visit (HOSPITAL_COMMUNITY): Payer: Self-pay

## 2021-05-25 ENCOUNTER — Other Ambulatory Visit: Payer: Self-pay

## 2021-05-25 ENCOUNTER — Ambulatory Visit (INDEPENDENT_AMBULATORY_CARE_PROVIDER_SITE_OTHER): Payer: No Typology Code available for payment source | Admitting: Family Medicine

## 2021-05-25 ENCOUNTER — Other Ambulatory Visit (HOSPITAL_COMMUNITY): Payer: Self-pay

## 2021-05-25 VITALS — BP 115/66 | HR 76 | Temp 97.7°F | Ht 67.0 in | Wt 232.2 lb

## 2021-05-25 DIAGNOSIS — E785 Hyperlipidemia, unspecified: Secondary | ICD-10-CM

## 2021-05-25 DIAGNOSIS — F4321 Adjustment disorder with depressed mood: Secondary | ICD-10-CM

## 2021-05-25 DIAGNOSIS — E1159 Type 2 diabetes mellitus with other circulatory complications: Secondary | ICD-10-CM | POA: Diagnosis not present

## 2021-05-25 DIAGNOSIS — E119 Type 2 diabetes mellitus without complications: Secondary | ICD-10-CM

## 2021-05-25 DIAGNOSIS — E1169 Type 2 diabetes mellitus with other specified complication: Secondary | ICD-10-CM | POA: Diagnosis not present

## 2021-05-25 DIAGNOSIS — F418 Other specified anxiety disorders: Secondary | ICD-10-CM

## 2021-05-25 DIAGNOSIS — I152 Hypertension secondary to endocrine disorders: Secondary | ICD-10-CM

## 2021-05-25 MED ORDER — ALPRAZOLAM 0.5 MG PO TABS
0.5000 mg | ORAL_TABLET | Freq: Two times a day (BID) | ORAL | 0 refills | Status: DC | PRN
  Filled 2021-05-25: qty 20, 10d supply, fill #0

## 2021-05-25 NOTE — Progress Notes (Signed)
Subjective: CC: DM PCP: [name], DO LOV:FIEPPI [name] is a 60 y.o. female presenting to clinic today for:  1. Type 2 Diabetes with hypertension, hyperlipidemia:  Admits to some weight gain but she has been working on this.  She is compliant with metformin 500 mg twice daily, statin 2 mg nightly and Altace daily.  Last eye exam: UTD Last foot exam: needs Last A1c:  Lab Results  Component Value Date   HGBA1C 6.5 11/05/2020   Nephropathy screen indicated?: UTD Last flu, zoster and/or pneumovax:  Immunization History  Administered Date(s) Administered   Influenza Split 07/17/2013   Influenza,inj,Quad PF,6+ Mos 07/10/2015, 07/05/2016, 06/30/2017, 07/12/2018, 07/23/2019   Influenza-Unspecified 07/08/2014, 07/11/2020   Moderna Sars-Covid-2 Vaccination 01/29/2020, 02/25/2020   Pneumococcal Conjugate-13 11/30/2017   Pneumococcal Polysaccharide-23 11/05/2020    ROS: Denies dizziness, LOC, polyuria, polydipsia, unintended weight loss/gain, foot ulcerations, numbness or tingling in extremities, shortness of breath or chest pain.  2. Grief reaction She is working through her grief.  Her father was very ill towards the end of his life as he had a perforated gallbladder that was gangrenous.  She is currently trying to work through his estate with her siblings.  She is resting well now that he has passed that she is not worried about him suffering.  She does not feel that she needs any additional interventions at this time but will be planning a flight soon and would like to have an anxiolytic on hand for that.  ROS: Per HPI  Allergies  Allergen Reactions   Erythromycin Other (See Comments)    Abdominal pain   Codeine Other (See Comments)    Hyper    Sulfa Antibiotics Hives   Tetracycline Rash   Past Medical History:  Diagnosis Date   Allergy    seasonal   Anxiety    Arthritis    Diabetes mellitus    type ii   GERD (gastroesophageal reflux disease)    Hiatal hernia     Hyperlipidemia    Hypertension    Irritable bowel syndrome    Obesity    Sinus infection 08/14/2017   Symptoms resolved after antiobiotic course   Tubular adenoma of colon 05/27/2015   Ulcer     Current Outpatient Medications:    acetaminophen (TYLENOL) 500 MG tablet, Take 1 tablet (500 mg total) by mouth every 6 (six) hours as needed., Disp: 30 tablet, Rfl: 0   ALPRAZolam (XANAX) 0.5 MG tablet, Take 1 tablet (0.5 mg total) by mouth at bedtime as needed for sleep., Disp: 60 tablet, Rfl: 0   aspirin EC 81 MG tablet, Take 81 mg by mouth daily., Disp: , Rfl:    Biotin 10000 MCG TABS, Take by mouth., Disp: , Rfl:    Blood Glucose Monitoring Suppl (FREESTYLE FREEDOM LITE) w/Device KIT, Test blood sugar BID Dx E11.9, Disp: 1 kit, Rfl: 0   furosemide (LASIX) 40 MG tablet, TAKE 1 TABLET BY MOUTH DAILY., Disp: 90 tablet, Rfl: 1   glucose blood (FREESTYLE LITE) test strip, Test blood sugar BID Dx E11.9, Disp: 200 each, Rfl: 3   hydrocortisone (ANUSOL-HC) 25 MG suppository, Place 1 suppository (25 mg total) rectally 2 (two) times daily., Disp: 12 suppository, Rfl: 0   Lancets (FREESTYLE) lancets, Test blood sugar BID Dx E11.9, Disp: 200 each, Rfl: 3   levofloxacin (LEVAQUIN) 500 MG tablet, Take 1 tablet (500 mg total) by mouth daily. For 10 days, Disp: 10 tablet, Rfl: 0   metFORMIN (GLUCOPHAGE) 500 MG  tablet, TAKE 1 TABLET BY MOUTH 2 TIMES DAILY, Disp: 180 tablet, Rfl: 0   multivitamin-iron-minerals-folic acid (CENTRUM) chewable tablet, Chew 1 tablet by mouth daily., Disp: , Rfl:    omeprazole (PRILOSEC) 20 MG capsule, Take 20 mg by mouth daily., Disp: , Rfl:    Pitavastatin Calcium 2 MG TABS, TAKE 1 TABLET BY MOUTH AT BEDTIME., Disp: 90 tablet, Rfl: 1   ramipril (ALTACE) 10 MG capsule, TAKE 2 CAPSULES BY MOUTH ONCE A DAY, Disp: 180 capsule, Rfl: 1 Social History   Socioeconomic History   Marital status: Married    Spouse name: Not on file   Number of children: Not on file   Years of  education: Not on file   Highest education level: Not on file  Occupational History   Not on file  Tobacco Use   Smoking status: Never   Smokeless tobacco: Never   Tobacco comments:    AS A Teenager  Vaping Use   Vaping Use: Never used  Substance and Sexual Activity   Alcohol use: Yes    Alcohol/week: 0.0 standard drinks    Comment:  occasional wine   Drug use: No   Sexual activity: Not on file  Other Topics Concern   Not on file  Social History Narrative   Not on file   Social Determinants of Health   Financial Resource Strain: Not on file  Food Insecurity: Not on file  Transportation Needs: Not on file  Physical Activity: Not on file  Stress: Not on file  Social Connections: Not on file  Intimate Partner Violence: Not on file   Family History  Problem Relation Age of Onset   Diabetes Mother    Kidney disease Mother    Colitis Father    Colon polyps Father    Heart disease Father    Irritable bowel syndrome Father    Squamous cell carcinoma Father    Other Other        corkscrew esophagus- father, sister   Crohn's disease Sister    Colitis Sister    Colon polyps Sister    Ulcerative colitis Sister    Diabetes Maternal Aunt    Diabetes Paternal Aunt    Diabetes Maternal Grandmother    Diabetes Paternal Grandmother    Heart disease Paternal Grandmother    Colitis Paternal Grandfather    Colon cancer Neg Hx    Esophageal cancer Neg Hx    Pancreatic cancer Neg Hx    Rectal cancer Neg Hx    Stomach cancer Neg Hx     Objective: Office vital signs reviewed. BP 115/66   Pulse 76   Temp 97.7 F (36.5 C)   Ht '5\' 7"'  (1.702 m)   Wt 232 lb 3.2 oz (105.3 kg)   LMP 12/26/2014 (Approximate) Comment: 2-3 days  SpO2 98%   BMI 36.37 kg/m   Physical Examination:  General: Awake, alert, well nourished, No acute distress HEENT: Normal; sclera white.  Moist mucous membranes Cardio: regular rate and rhythm, S1S2 heard, no murmurs appreciated Pulm: clear to  auscultation bilaterally, no wheezes, rhonchi or rales; normal work of breathing on room air Extremities: warm, well perfused, No edema, cyanosis or clubbing; +2 pulses bilaterally MSK: normal gait and station; healing distal inner pharyngeal joint of the right hand. Psych: Mood somewhat depressed.  Patient is very pleasant and interactive. Neuro: see DM foot  Depression screen [facility] 2/9 04/21/2021 11/05/2020 08/05/2020  Decreased Interest 0 0 0  Down, Depressed, Hopeless 0 0  0  PHQ - 2 Score 0 0 0  Altered sleeping - 0 -  Tired, decreased energy - 0 -  Change in appetite - 0 -  Feeling bad or failure about yourself  - 0 -  Trouble concentrating - 0 -  Moving slowly or fidgety/restless - 0 -  Suicidal thoughts - 0 -  PHQ-9 Score - 0 -  Difficult doing work/chores - - -  Some recent data might be hidden   No flowsheet data found.  Assessment/ Plan: 60 y.o. female   Controlled type 2 diabetes mellitus without complication, without long-term current use of insulin ([place]) - Plan: CMP14+EGFR, Bayer DCA Hb A1c Waived  Hypertension associated with diabetes ([place]) - Plan: CMP14+EGFR  Hyperlipidemia associated with type 2 diabetes mellitus ([place]) - Plan: CMP14+EGFR, Lipid panel  Grief reaction  Situational anxiety - Plan: ALPRAZolam (XANAX) 0.5 MG tablet  Check A1c.  Blood pressure is at goal.  No changes needed.  Continue statin.  Plan for CMP and fasting lipid panel.  She is grieving appropriately but seems to be compensating well.  I encouraged her to reach out to me should she need anything  For her flight I have given her alprazolam which has been helpful for her and she has not had intolerance or any significant side effects from it.  National narcotic database was reviewed and there were no red flags  No orders of the defined types were placed in this encounter.  No orders of the defined types were placed in this encounter.    [name], DO [place] [phone]

## 2021-05-27 ENCOUNTER — Other Ambulatory Visit: Payer: No Typology Code available for payment source

## 2021-05-27 DIAGNOSIS — E1159 Type 2 diabetes mellitus with other circulatory complications: Secondary | ICD-10-CM

## 2021-05-27 DIAGNOSIS — E119 Type 2 diabetes mellitus without complications: Secondary | ICD-10-CM

## 2021-05-27 DIAGNOSIS — E1169 Type 2 diabetes mellitus with other specified complication: Secondary | ICD-10-CM

## 2021-05-27 DIAGNOSIS — I152 Hypertension secondary to endocrine disorders: Secondary | ICD-10-CM

## 2021-05-27 DIAGNOSIS — E785 Hyperlipidemia, unspecified: Secondary | ICD-10-CM

## 2021-05-27 LAB — BAYER DCA HB A1C WAIVED: HB A1C (BAYER DCA - WAIVED): 6.3 % (ref <7.0)

## 2021-05-28 LAB — CMP14+EGFR
ALT: 24 IU/L (ref 0–32)
AST: 19 IU/L (ref 0–40)
Albumin/Globulin Ratio: 2 (ref 1.2–2.2)
Albumin: 4.5 g/dL (ref 3.8–4.9)
Alkaline Phosphatase: 66 IU/L (ref 44–121)
BUN/Creatinine Ratio: 11 (ref 9–23)
BUN: 10 mg/dL (ref 6–24)
Bilirubin Total: 0.3 mg/dL (ref 0.0–1.2)
CO2: 26 mmol/L (ref 20–29)
Calcium: 9.5 mg/dL (ref 8.7–10.2)
Chloride: 97 mmol/L (ref 96–106)
Creatinine, Ser: 0.92 mg/dL (ref 0.57–1.00)
Globulin, Total: 2.2 g/dL (ref 1.5–4.5)
Glucose: 151 mg/dL — ABNORMAL HIGH (ref 65–99)
Potassium: 3.7 mmol/L (ref 3.5–5.2)
Sodium: 141 mmol/L (ref 134–144)
Total Protein: 6.7 g/dL (ref 6.0–8.5)
eGFR: 72 mL/min/{1.73_m2} (ref >59)

## 2021-05-28 LAB — LIPID PANEL
Chol/HDL Ratio: 3.4 ratio (ref 0.0–4.4)
Cholesterol, Total: 137 mg/dL (ref 100–199)
HDL: 40 mg/dL (ref >39)
LDL Chol Calc (NIH): 64 mg/dL (ref 0–99)
Triglycerides: 196 mg/dL — ABNORMAL HIGH (ref 0–149)
VLDL Cholesterol Cal: 33 mg/dL (ref 5–40)

## 2021-06-12 ENCOUNTER — Other Ambulatory Visit (HOSPITAL_COMMUNITY): Payer: Self-pay

## 2021-06-12 ENCOUNTER — Other Ambulatory Visit: Payer: Self-pay | Admitting: Family Medicine

## 2021-06-12 MED ORDER — FUROSEMIDE 40 MG PO TABS
ORAL_TABLET | Freq: Every day | ORAL | 1 refills | Status: DC
  Filled 2021-06-12: qty 90, 90d supply, fill #0
  Filled 2021-08-31: qty 90, 90d supply, fill #1

## 2021-06-12 MED ORDER — LIVALO 2 MG PO TABS
1.0000 | ORAL_TABLET | Freq: Every day | ORAL | 1 refills | Status: DC
  Filled 2021-06-12: qty 90, 90d supply, fill #0
  Filled 2021-10-20: qty 90, 90d supply, fill #1

## 2021-06-12 MED ORDER — METFORMIN HCL 500 MG PO TABS
ORAL_TABLET | Freq: Two times a day (BID) | ORAL | 1 refills | Status: DC
  Filled 2021-06-12: qty 180, 90d supply, fill #0
  Filled 2021-08-31: qty 180, 90d supply, fill #1

## 2021-06-12 MED ORDER — RAMIPRIL 10 MG PO CAPS
ORAL_CAPSULE | Freq: Every day | ORAL | 1 refills | Status: DC
  Filled 2021-06-12: qty 180, 90d supply, fill #0
  Filled 2021-08-31: qty 180, 90d supply, fill #1

## 2021-07-01 ENCOUNTER — Other Ambulatory Visit (HOSPITAL_COMMUNITY): Payer: Self-pay

## 2021-07-01 ENCOUNTER — Ambulatory Visit (INDEPENDENT_AMBULATORY_CARE_PROVIDER_SITE_OTHER): Payer: No Typology Code available for payment source | Admitting: Family Medicine

## 2021-07-01 ENCOUNTER — Encounter: Payer: Self-pay | Admitting: Family Medicine

## 2021-07-01 VITALS — BP 127/66 | HR 77 | Temp 97.9°F | Ht 67.0 in | Wt 229.2 lb

## 2021-07-01 DIAGNOSIS — R059 Cough, unspecified: Secondary | ICD-10-CM

## 2021-07-01 MED ORDER — BENZONATATE 100 MG PO CAPS
100.0000 mg | ORAL_CAPSULE | Freq: Three times a day (TID) | ORAL | 0 refills | Status: DC | PRN
  Filled 2021-07-01: qty 20, 7d supply, fill #0

## 2021-07-01 MED ORDER — METHYLPREDNISOLONE ACETATE 80 MG/ML IJ SUSP
80.0000 mg | Freq: Once | INTRAMUSCULAR | Status: AC
  Administered 2021-07-01: 80 mg via INTRAMUSCULAR

## 2021-07-01 NOTE — Progress Notes (Signed)
Acute Office Visit  Subjective:    Patient ID: [name], female    DOB: 1961/05/20, 60 y.o.   MRN: [id number]  Chief Complaint  Patient presents with   Cough    HPI Patient is in today for cough x 8 days since testing positive for Covid. The cough has been unchanged since her symptoms started. It has been really bothersome. She is having coughing fits at times. The cough is productive with white to yellow sputum. Denies fever, chills, shortness of breath, or chest pain. She has taken robitussin and cough drops without improvement. She often gets bronchitis and responds well to steroid injections with this.   Past Medical History:  Diagnosis Date   Allergy    seasonal   Anxiety    Arthritis    Diabetes mellitus    type ii   GERD (gastroesophageal reflux disease)    Hiatal hernia    Hyperlipidemia    Hypertension    Irritable bowel syndrome    Obesity    Sinus infection 08/14/2017   Symptoms resolved after antiobiotic course   Tubular adenoma of colon 05/27/2015   Ulcer     Past Surgical History:  Procedure Laterality Date   CHOLECYSTECTOMY  2009   COLONOSCOPY     POLYPECTOMY      Family History  Problem Relation Age of Onset   Diabetes Mother    Kidney disease Mother    Colitis Father    Colon polyps Father    Heart disease Father    Irritable bowel syndrome Father    Squamous cell carcinoma Father    Other Other        corkscrew esophagus- father, sister   Crohn's disease Sister    Colitis Sister    Colon polyps Sister    Ulcerative colitis Sister    Diabetes Maternal Aunt    Diabetes Paternal Aunt    Diabetes Maternal Grandmother    Diabetes Paternal Grandmother    Heart disease Paternal Grandmother    Colitis Paternal Grandfather    Colon cancer Neg Hx    Esophageal cancer Neg Hx    Pancreatic cancer Neg Hx    Rectal cancer Neg Hx    Stomach cancer Neg Hx     Social History   Socioeconomic History   Marital status: Married    Spouse  name: Not on file   Number of children: Not on file   Years of education: Not on file   Highest education level: Not on file  Occupational History   Not on file  Tobacco Use   Smoking status: Never   Smokeless tobacco: Never   Tobacco comments:    AS A Teenager  Vaping Use   Vaping Use: Never used  Substance and Sexual Activity   Alcohol use: Yes    Alcohol/week: 0.0 standard drinks    Comment:  occasional wine   Drug use: No   Sexual activity: Not on file  Other Topics Concern   Not on file  Social History Narrative   Not on file   Social Determinants of Health   Financial Resource Strain: Not on file  Food Insecurity: Not on file  Transportation Needs: Not on file  Physical Activity: Not on file  Stress: Not on file  Social Connections: Not on file  Intimate Partner Violence: Not on file    Outpatient Medications Prior to Visit  Medication Sig Dispense Refill   acetaminophen (TYLENOL) 500 MG tablet Take 1  tablet (500 mg total) by mouth every 6 (six) hours as needed. 30 tablet 0   aspirin EC 81 MG tablet Take 81 mg by mouth daily.     Blood Glucose Monitoring Suppl (FREESTYLE FREEDOM LITE) w/Device KIT Test blood sugar BID Dx E11.9 1 kit 0   furosemide (LASIX) 40 MG tablet TAKE 1 TABLET BY MOUTH DAILY. 90 tablet 1   glucose blood (FREESTYLE LITE) test strip Test blood sugar BID Dx E11.9 200 each 3   Lancets (FREESTYLE) lancets Test blood sugar BID Dx E11.9 200 each 3   metFORMIN (GLUCOPHAGE) 500 MG tablet TAKE 1 TABLET BY MOUTH 2 TIMES DAILY 180 tablet 1   multivitamin-iron-minerals-folic acid (CENTRUM) chewable tablet Chew 1 tablet by mouth daily.     omeprazole (PRILOSEC) 20 MG capsule Take 20 mg by mouth daily.     Pitavastatin Calcium (LIVALO) 2 MG TABS TAKE 1 TABLET BY MOUTH AT BEDTIME. 90 tablet 1   ramipril (ALTACE) 10 MG capsule TAKE 2 CAPSULES BY MOUTH ONCE A DAY 180 capsule 1   ALPRAZolam (XANAX) 0.5 MG tablet Take 1 tablet by mouth 2 times daily as needed  for anxiety (flight). (Patient not taking: Reported on 07/01/2021) 20 tablet 0   Biotin 10000 MCG TABS Take by mouth.     levofloxacin (LEVAQUIN) 500 MG tablet Take 1 tablet (500 mg total) by mouth daily. For 10 days 10 tablet 0   No facility-administered medications prior to visit.    Allergies  Allergen Reactions   Erythromycin Other (See Comments)    Abdominal pain   Codeine Other (See Comments)    Hyper    Sulfa Antibiotics Hives   Tetracycline Rash    Review of Systems As per HPI.     Objective:    Physical Exam Vitals and nursing note reviewed.  Constitutional:      General: She is not in acute distress.    Appearance: Normal appearance. She is not ill-appearing, toxic-appearing or diaphoretic.  HENT:     Head: Normocephalic and atraumatic.     Right Ear: Tympanic membrane, ear canal and external ear normal.     Left Ear: Tympanic membrane, ear canal and external ear normal.     Nose: Nose normal.     Mouth/Throat:     Mouth: Mucous membranes are moist.     Pharynx: Oropharynx is clear.  Cardiovascular:     Rate and Rhythm: Normal rate and regular rhythm.     Heart sounds: Normal heart sounds. No murmur heard. Pulmonary:     Effort: Pulmonary effort is normal. No respiratory distress.     Breath sounds: Normal breath sounds. No wheezing, rhonchi or rales.  Chest:     Chest wall: No tenderness.  Skin:    General: Skin is warm and dry.  Neurological:     General: No focal deficit present.     Mental Status: She is alert and oriented to person, place, and time.  Psychiatric:        Mood and Affect: Mood normal.        Behavior: Behavior normal.    BP 127/66   Pulse 77   Temp 97.9 F (36.6 C) (Temporal)   Ht '5\' 7"'  (1.702 m)   Wt 229 lb 4 oz (104 kg)   LMP 12/26/2014 (Approximate) Comment: 2-3 days  SpO2 98%   BMI 35.91 kg/m  Wt Readings from Last 3 Encounters:  07/01/21 229 lb 4 oz (104 kg)  05/25/21 232  lb 3.2 oz (105.3 kg)  04/21/21 241 lb 3.2 oz  (109.4 kg)    Health Maintenance Due  Topic Date Due   HIV Screening  Never done   COVID-19 Vaccine (3 - Moderna risk series) 03/24/2020   PAP SMEAR-Modifier  07/06/2020   FOOT EXAM  04/30/2021   INFLUENZA VACCINE  05/04/2021    There are no preventive care reminders to display for this patient.   Lab Results  Component Value Date   TSH 1.740 04/29/2020   Lab Results  Component Value Date   WBC 8.7 04/29/2020   HGB 13.4 04/29/2020   HCT 41.0 04/29/2020   MCV 87 04/29/2020   PLT 305 04/29/2020   Lab Results  Component Value Date   NA 141 05/27/2021   K 3.7 05/27/2021   CO2 26 05/27/2021   GLUCOSE 151 (H) 05/27/2021   BUN 10 05/27/2021   CREATININE 0.92 05/27/2021   BILITOT 0.3 05/27/2021   ALKPHOS 66 05/27/2021   AST 19 05/27/2021   ALT 24 05/27/2021   PROT 6.7 05/27/2021   ALBUMIN 4.5 05/27/2021   CALCIUM 9.5 05/27/2021   EGFR 72 05/27/2021   Lab Results  Component Value Date   CHOL 137 05/27/2021   Lab Results  Component Value Date   HDL 40 05/27/2021   Lab Results  Component Value Date   LDLCALC 64 05/27/2021   Lab Results  Component Value Date   TRIG 196 (H) 05/27/2021   Lab Results  Component Value Date   CHOLHDL 3.4 05/27/2021   Lab Results  Component Value Date   HGBA1C 6.3 05/27/2021       Assessment & Plan:   Deanna was seen today for cough.  Diagnoses and all orders for this visit:  Cough Post Covid. Lungs clear on exam today. Denies fever or shortness of breath. Steroid IM injection today in office. Tessalon perles ordered. Plain mucinex prn. Discussed if no improvement in next few days, will try antibiotic.  -     benzonatate (TESSALON PERLES) 100 MG capsule; Take 1 capsule (100 mg total) by mouth 3 (three) times daily as needed for cough. -     methylPREDNISolone acetate (DEPO-MEDROL) injection 80 mg  Return to office for new or worsening symptoms, or if symptoms persist.   The patient indicates understanding of these  issues and agrees with the plan.   Gwenlyn Perking, FNP

## 2021-07-06 ENCOUNTER — Telehealth: Payer: Self-pay | Admitting: Family Medicine

## 2021-07-06 DIAGNOSIS — J019 Acute sinusitis, unspecified: Secondary | ICD-10-CM

## 2021-07-06 MED ORDER — AMOXICILLIN-POT CLAVULANATE 875-125 MG PO TABS
1.0000 | ORAL_TABLET | Freq: Two times a day (BID) | ORAL | 0 refills | Status: AC
Start: 2021-07-06 — End: 2021-07-13

## 2021-07-06 NOTE — Telephone Encounter (Signed)
Augmentin sent in.  

## 2021-07-29 ENCOUNTER — Encounter (HOSPITAL_COMMUNITY): Payer: Self-pay

## 2021-07-29 ENCOUNTER — Emergency Department (HOSPITAL_COMMUNITY)
Admission: EM | Admit: 2021-07-29 | Discharge: 2021-07-29 | Disposition: A | Discharge status: Home / Self Care | Payer: No Typology Code available for payment source | Attending: Emergency Medicine | Admitting: Emergency Medicine

## 2021-07-29 DIAGNOSIS — Z7984 Long term (current) use of oral hypoglycemic drugs: Secondary | ICD-10-CM | POA: Diagnosis not present

## 2021-07-29 DIAGNOSIS — I10 Essential (primary) hypertension: Secondary | ICD-10-CM | POA: Diagnosis not present

## 2021-07-29 DIAGNOSIS — H2101 Hyphema, right eye: Secondary | ICD-10-CM | POA: Insufficient documentation

## 2021-07-29 DIAGNOSIS — Z79899 Other long term (current) drug therapy: Secondary | ICD-10-CM | POA: Insufficient documentation

## 2021-07-29 DIAGNOSIS — Z7982 Long term (current) use of aspirin: Secondary | ICD-10-CM | POA: Insufficient documentation

## 2021-07-29 DIAGNOSIS — M79651 Pain in right thigh: Secondary | ICD-10-CM | POA: Diagnosis not present

## 2021-07-29 DIAGNOSIS — E119 Type 2 diabetes mellitus without complications: Secondary | ICD-10-CM | POA: Insufficient documentation

## 2021-07-29 DIAGNOSIS — S0591XA Unspecified injury of right eye and orbit, initial encounter: Secondary | ICD-10-CM | POA: Diagnosis present

## 2021-07-29 DIAGNOSIS — W228XXA Striking against or struck by other objects, initial encounter: Secondary | ICD-10-CM | POA: Diagnosis not present

## 2021-07-29 MED ORDER — BRIMONIDINE TARTRATE 0.15 % OP SOLN
1.0000 [drp] | Freq: Once | OPHTHALMIC | Status: AC
  Administered 2021-07-29: 1 [drp] via OPHTHALMIC
  Filled 2021-07-29: qty 5

## 2021-07-29 MED ORDER — PREDNISOLONE ACETATE 1 % OP SUSP
1.0000 [drp] | Freq: Once | OPHTHALMIC | Status: AC
  Administered 2021-07-29: 1 [drp] via OPHTHALMIC
  Filled 2021-07-29: qty 5

## 2021-07-29 MED ORDER — DORZOLAMIDE HCL 2 % OP SOLN: 1.0000 [drp] | Freq: Three times a day (TID) | OPHTHALMIC | Status: DC

## 2021-07-29 MED ORDER — FLUORESCEIN SODIUM 1 MG OP STRP
1.0000 | ORAL_STRIP | Freq: Once | OPHTHALMIC | Status: AC
  Administered 2021-07-29: 1 via OPHTHALMIC
  Filled 2021-07-29: qty 1

## 2021-07-29 MED ORDER — TIMOLOL MALEATE 0.5 % OP SOLN
1.0000 [drp] | Freq: Once | OPHTHALMIC | Status: AC
  Administered 2021-07-29: 1 [drp] via OPHTHALMIC
  Filled 2021-07-29: qty 5

## 2021-07-29 MED ORDER — TETRACAINE HCL 0.5 % OP SOLN
2.0000 [drp] | Freq: Once | OPHTHALMIC | Status: AC
  Administered 2021-07-29: 2 [drp] via OPHTHALMIC
  Filled 2021-07-29: qty 4

## 2021-07-29 NOTE — Discharge Instructions (Signed)
Use the prednisolone 4 times a day. Use the brimonidine and timolol drops 2 times a day (next dose tomorrow am).  Call Dr. Lynnda Child office tomorrow morning to set up an appointment for tomorrow. Return to the emergency room if you develop severe worsening pain, vision loss, a black curtain over your vision, inability to move your eye/double vision, or any new, worsening, or concerning symptoms

## 2021-07-29 NOTE — ED Notes (Signed)
Visual acuity screening: 20/25 left eye 20/40 right eye

## 2021-07-29 NOTE — ED Notes (Signed)
Attempted visual acuity exam. Patient was not able to read any letters, however she stated this was normal for her. Patient was not wearing contacts/glasses at this time.

## 2021-07-29 NOTE — ED Triage Notes (Signed)
Pt arrived via POV, c/o right eye pain after being struck in the eye with bungee cord.

## 2021-07-29 NOTE — ED Provider Notes (Signed)
[facility] DEPT Provider Note   CSN: [id number] Arrival date & time: 07/29/21  1835     History Chief Complaint  Patient presents with   Eye Pain    [name] is a 60 y.o. female presenting for evaluation of right eye pain.  Patient states about 1 hour prior to arrival she was securing something in the truck with a bungee cord when it snapped back and hit her in the right thigh.  She reports acute onset pain in that thigh.  She has been using ice on the area, has not taken anything else since.  She does wear contacts, was wearing contacts at the time.  She denies pain on the surface of her eye, describes it as a dull ache and pressure.  No obvious vision loss.  No injury elsewhere.  No headache.  HPI     Past Medical History:  Diagnosis Date   Allergy    seasonal   Anxiety    Arthritis    Diabetes mellitus    type ii   GERD (gastroesophageal reflux disease)    Hiatal hernia    Hyperlipidemia    Hypertension    Irritable bowel syndrome    Obesity    Sinus infection 08/14/2017   Symptoms resolved after antiobiotic course   Tubular adenoma of colon 05/27/2015   Ulcer     Patient Active Problem List   Diagnosis Date Noted   Cough 04/08/2021   Disorder of cecum 11/05/2020   Osteopenia 10/24/2019   Polyarthralgia 11/30/2017   Osteoarthritis of knee 10/30/2017   Trochanteric bursitis 10/30/2017   Pain of left hip joint 10/24/2017   History of hypertension 07/06/2017   Hepatic steatosis 11/01/2016   Type 2 diabetes mellitus, controlled ([place]) 01/28/2016   Hyperlipidemia associated with type 2 diabetes mellitus ([place]) 01/28/2016   Globus sensation 09/06/2015   Gastroesophageal reflux disease with esophagitis 09/06/2015   OBESITY 10/23/2007   Hypertension associated with diabetes ([place]) 10/23/2007   IBS (irritable bowel syndrome) 10/23/2007   HIATAL HERNIA 10/07/2006   POLYP, COLON 09/14/2006    Past Surgical History:  Procedure  Laterality Date   CHOLECYSTECTOMY  2009   COLONOSCOPY     POLYPECTOMY       OB History   No obstetric history on file.     Family History  Problem Relation Age of Onset   Diabetes Mother    Kidney disease Mother    Colitis Father    Colon polyps Father    Heart disease Father    Irritable bowel syndrome Father    Squamous cell carcinoma Father    Other Other        corkscrew esophagus- father, sister   Crohn's disease Sister    Colitis Sister    Colon polyps Sister    Ulcerative colitis Sister    Diabetes Maternal Aunt    Diabetes Paternal Aunt    Diabetes Maternal Grandmother    Diabetes Paternal Grandmother    Heart disease Paternal Grandmother    Colitis Paternal Grandfather    Colon cancer Neg Hx    Esophageal cancer Neg Hx    Pancreatic cancer Neg Hx    Rectal cancer Neg Hx    Stomach cancer Neg Hx     Social History   Tobacco Use   Smoking status: Never   Smokeless tobacco: Never   Tobacco comments:    AS A Teenager  Vaping Use   Vaping Use: Never used  Substance Use Topics   Alcohol use: Yes    Alcohol/week: 0.0 standard drinks    Comment:  occasional wine   Drug use: No    Home Medications Prior to Admission medications   Medication Sig Start Date End Date Taking? Authorizing Provider  acetaminophen (TYLENOL) 500 MG tablet Take 1 tablet (500 mg total) by mouth every 6 (six) hours as needed. 04/08/21   Ivy Lynn, NP  ALPRAZolam Duanne Moron) 0.5 MG tablet Take 1 tablet by mouth 2 times daily as needed for anxiety (flight). Patient not taking: Reported on 07/01/2021 05/25/21   [name], DO  aspirin EC 81 MG tablet Take 81 mg by mouth daily.    [provider]  benzonatate (TESSALON PERLES) 100 MG capsule Take 1 capsule (100 mg total) by mouth 3 (three) times daily as needed for cough. 07/01/21   Gwenlyn Perking, FNP  Blood Glucose Monitoring Suppl (FREESTYLE FREEDOM LITE) w/Device KIT Test blood sugar BID Dx E11.9 09/06/19    Ronnie Doss M, DO  furosemide (LASIX) 40 MG tablet TAKE 1 TABLET BY MOUTH DAILY. 06/12/21 06/12/22  Ronnie Doss M, DO  glucose blood (FREESTYLE LITE) test strip Test blood sugar BID Dx E11.9 09/06/19   Ronnie Doss M, DO  Lancets (FREESTYLE) lancets Test blood sugar BID Dx E11.9 09/06/19   Ronnie Doss M, DO  metFORMIN (GLUCOPHAGE) 500 MG tablet TAKE 1 TABLET BY MOUTH 2 TIMES DAILY 06/12/21 06/12/22  Ronnie Doss M, DO  multivitamin-iron-minerals-folic acid (CENTRUM) chewable tablet Chew 1 tablet by mouth daily.    [provider]  omeprazole (PRILOSEC) 20 MG capsule Take 20 mg by mouth daily.    [provider]  Pitavastatin Calcium (LIVALO) 2 MG TABS TAKE 1 TABLET BY MOUTH AT BEDTIME. 06/12/21 06/12/22  Ronnie Doss M, DO  ramipril (ALTACE) 10 MG capsule TAKE 2 CAPSULES BY MOUTH ONCE A DAY 06/12/21 06/12/22  Ronnie Doss M, DO    Allergies    Erythromycin, Codeine, Sulfa antibiotics, and Tetracycline  Review of Systems   Review of Systems  Eyes:  Positive for pain.  All other systems reviewed and are negative.  Physical Exam Updated Vital Signs BP (!) 149/80 (BP Location: Left Arm)   Pulse 80   Temp 98 F (36.7 C) (Oral)   Resp 16   LMP 12/26/2014 (Approximate) Comment: 2-3 days  SpO2 98%   Physical Exam Vitals and nursing note reviewed.  Constitutional:      General: She is not in acute distress.    Appearance: Normal appearance.  HENT:     Head: Normocephalic and atraumatic.  Eyes:     Intraocular pressure: Right eye pressure is 31 mmHg. Left eye pressure is 19 mmHg. Measurements were taken using a handheld tonometer.    Extraocular Movements: Extraocular movements intact.     Pupils:     Right eye: Pupil is not round and not reactive. No corneal abrasion or fluorescein uptake. Seidel exam negative.     Slit lamp exam:    Right eye: Hyphema present. No photophobia.     Left eye: No photophobia.     Comments: Tonopen measures R eye:  28 and 31, L eye 19 and 22  Cardiovascular:     Rate and Rhythm: Normal rate and regular rhythm.     Pulses: Normal pulses.  Pulmonary:     Effort: Pulmonary effort is normal. No respiratory distress.     Breath sounds: Normal breath sounds. No wheezing.  Comments: Speaking in full sentences.  Clear lung sounds in all fields. Abdominal:     General: There is no distension.     Palpations: Abdomen is soft.     Tenderness: There is no abdominal tenderness.  Musculoskeletal:        General: Normal range of motion.     Cervical back: Normal range of motion and neck supple.  Skin:    General: Skin is warm and dry.     Capillary Refill: Capillary refill takes less than 2 seconds.  Neurological:     Mental Status: She is alert and oriented to person, place, and time.  Psychiatric:        Mood and Affect: Mood and affect normal.        Speech: Speech normal.        Behavior: Behavior normal.           ED Results / Procedures / Treatments   Labs (all labs ordered are listed, but only abnormal results are displayed) Labs Reviewed - No data to display  EKG None  Radiology No results found.  Procedures Procedures   Medications Ordered in ED Medications  tetracaine (PONTOCAINE) 0.5 % ophthalmic solution 2 drop (2 drops Both Eyes Given 07/29/21 1921)  fluorescein ophthalmic strip 1 strip (1 strip Right Eye Given 07/29/21 1921)  brimonidine (ALPHAGAN) 0.15 % ophthalmic solution 1 drop (1 drop Right Eye Given 07/29/21 2037)  prednisoLONE acetate (PRED FORTE) 1 % ophthalmic suspension 1 drop (1 drop Right Eye Given 07/29/21 2042)  timolol (TIMOPTIC) 0.5 % ophthalmic solution 1 drop (1 drop Right Eye Given 07/29/21 2040)    ED Course  I have reviewed the triage vital signs and the nursing notes.  Pertinent labs & imaging results that were available during my care of the patient were reviewed by me and considered in my medical decision making (see chart for details).     MDM Rules/Calculators/A&P                           Patient presenting for evaluation after being hit in the right eye with a bungee cord.  On exam, patient has unequal pupils.  Right pupil is nonreactive and there is a hyphema present.  Rami discussed with attending, Dr. Sherry Ruffing evaluated the patient.  Will do fluorescein to look for Seidel sign or fluorescein stain uptake.  No uptake with a foreseen stain.  Will consult with ophthalmology.  Discussed with Dr. Nancy Fetter from ophthalmology who recommends getting IOP's, looking for adults of more than 20.  Recommends patient sleep upright tonight.  Can be followed in the clinic.  IOP show right eye of 31 and then 28.  Left eye of 19 and then 22.  Discussed with Dr. Nancy Fetter, who recommends starting patient on timolol, brimonizine, and prednisolone. F/u in office tomorrow.  Discussed with patient, who is agreeable.  Patient received first round of eyedrops in the ER.  At this time, patient appears safe for discharge.  Return precautions given.  Patient states she understands and agrees to plan  Final Clinical Impression(s) / ED Diagnoses Final diagnoses:  Right eye injury, initial encounter  Hyphema of right eye    Rx / DC Orders ED Discharge Orders     None        Franchot Heidelberg, PA-C 07/29/21 2145    Tegeler, Gwenyth Allegra, MD 07/29/21 2304

## 2021-07-29 NOTE — ED Notes (Signed)
Ice provided for patient.

## 2021-07-30 ENCOUNTER — Ambulatory Visit (INDEPENDENT_AMBULATORY_CARE_PROVIDER_SITE_OTHER): Payer: No Typology Code available for payment source | Admitting: Ophthalmology

## 2021-07-30 ENCOUNTER — Other Ambulatory Visit: Payer: Self-pay

## 2021-07-30 DIAGNOSIS — H2101 Hyphema, right eye: Secondary | ICD-10-CM | POA: Diagnosis not present

## 2021-07-30 DIAGNOSIS — H35033 Hypertensive retinopathy, bilateral: Secondary | ICD-10-CM | POA: Diagnosis not present

## 2021-07-30 DIAGNOSIS — S0590XA Unspecified injury of unspecified eye and orbit, initial encounter: Secondary | ICD-10-CM | POA: Diagnosis not present

## 2021-07-30 DIAGNOSIS — H2102 Hyphema, left eye: Secondary | ICD-10-CM

## 2021-07-30 DIAGNOSIS — I10 Essential (primary) hypertension: Secondary | ICD-10-CM

## 2021-07-30 DIAGNOSIS — H40052 Ocular hypertension, left eye: Secondary | ICD-10-CM

## 2021-07-30 DIAGNOSIS — H40051 Ocular hypertension, right eye: Secondary | ICD-10-CM | POA: Diagnosis not present

## 2021-07-30 DIAGNOSIS — H3581 Retinal edema: Secondary | ICD-10-CM

## 2021-07-30 DIAGNOSIS — H25813 Combined forms of age-related cataract, bilateral: Secondary | ICD-10-CM

## 2021-07-30 MED ORDER — ACETAZOLAMIDE ER 500 MG PO CP12: 500.0000 mg | ORAL_CAPSULE | Freq: Two times a day (BID) | ORAL | 0 refills | Status: DC

## 2021-07-30 NOTE — Progress Notes (Addendum)
[facility] Note  07/30/2021     CHIEF COMPLAINT Patient presents for Retina Evaluation   HISTORY OF PRESENT ILLNESS: [name] is a 60 y.o. female who presents to the clinic today for:   HPI     Retina Evaluation   In right eye.  This started 18 hours ago.  Duration of 18 hours.  I, the attending physician,  performed the HPI with the patient and updated documentation appropriately.        Comments   Patient here for Retina Evaluation. Patient was hit in OD with  bungee cord. Patient states vision is good OS. OD can see peripherally but not in the center. At the ER was told had blood in eye.  Has eye  pain. Went to ER was given Alphagan BID OD, Timoptic BID OD and Prednisolone QID OD. Used drops this am.       Last edited by Bernarda Caffey, MD on 07/31/2021 12:03 AM.    Pt got hit in the eye with a bungee cord in the right eye last night, she went to [facility] ED afterwards and was put on brimonidine and timoptic BID and Prednisolone QID OD, Dr. Nancy Fetter was the consulting ophthalmologist on call  Referring physician: [name], [place],  [place] [address]  HISTORICAL INFORMATION:   Selected notes from the MEDICAL RECORD NUMBER Referred by Dr. Nancy Fetter -- ED f/u for traumatic hyphema following bungee cord trauma LEE:  Ocular Hx- PMH-    CURRENT MEDICATIONS: Current Outpatient Medications (Ophthalmic Drugs)  Medication Sig   brimonidine (ALPHAGAN) 0.15 % ophthalmic solution Place 1 drop into the right eye 3 (three) times daily.   dorzolamide-timolol (COSOPT) 22.3-6.8 MG/ML ophthalmic solution Place 1 drop into the right eye 3 (three) times daily.   No current facility-administered medications for this visit. (Ophthalmic Drugs)   Current Outpatient Medications (Other)  Medication Sig   acetaZOLAMIDE ER (DIAMOX) 500 MG capsule Take 1 capsule (500 mg total) by mouth 2 (two) times daily.   acetaminophen (TYLENOL) 500 MG  tablet Take 1 tablet (500 mg total) by mouth every 6 (six) hours as needed.   ALPRAZolam (XANAX) 0.5 MG tablet Take 1 tablet by mouth 2 times daily as needed for anxiety (flight). (Patient not taking: No sig reported)   aspirin EC 81 MG tablet Take 81 mg by mouth daily.   benzonatate (TESSALON PERLES) 100 MG capsule Take 1 capsule (100 mg total) by mouth 3 (three) times daily as needed for cough.   Blood Glucose Monitoring Suppl (FREESTYLE FREEDOM LITE) w/Device KIT Test blood sugar BID Dx E11.9   furosemide (LASIX) 40 MG tablet TAKE 1 TABLET BY MOUTH DAILY.   glucose blood (FREESTYLE LITE) test strip Test blood sugar BID Dx E11.9   Lancets (FREESTYLE) lancets Test blood sugar BID Dx E11.9   metFORMIN (GLUCOPHAGE) 500 MG tablet TAKE 1 TABLET BY MOUTH 2 TIMES DAILY   multivitamin-iron-minerals-folic acid (CENTRUM) chewable tablet Chew 1 tablet by mouth daily.   omeprazole (PRILOSEC) 20 MG capsule Take 20 mg by mouth daily.   Pitavastatin Calcium (LIVALO) 2 MG TABS TAKE 1 TABLET BY MOUTH AT BEDTIME.   ramipril (ALTACE) 10 MG capsule TAKE 2 CAPSULES BY MOUTH ONCE A DAY   No current facility-administered medications for this visit. (Other)   REVIEW OF SYSTEMS: ROS   Positive for: Eyes Last edited by Theodore Demark, COA on 07/30/2021 10:47 AM.  ALLERGIES Allergies  Allergen Reactions   Erythromycin Other (See Comments)    Abdominal pain   Codeine Other (See Comments)    Hyper    Sulfa Antibiotics Hives   Tetracycline Rash   PAST MEDICAL HISTORY Past Medical History:  Diagnosis Date   Allergy    seasonal   Anxiety    Arthritis    Diabetes mellitus    type ii   GERD (gastroesophageal reflux disease)    Hiatal hernia    Hyperlipidemia    Hypertension    Irritable bowel syndrome    Obesity    Sinus infection 08/14/2017   Symptoms resolved after antiobiotic course   Tubular adenoma of colon 05/27/2015   Ulcer    Past Surgical History:  Procedure Laterality Date    CHOLECYSTECTOMY  2009   COLONOSCOPY     POLYPECTOMY     FAMILY HISTORY Family History  Problem Relation Age of Onset   Diabetes Mother    Kidney disease Mother    Colitis Father    Colon polyps Father    Heart disease Father    Irritable bowel syndrome Father    Squamous cell carcinoma Father    Other Other        corkscrew esophagus- father, sister   Crohn's disease Sister    Colitis Sister    Colon polyps Sister    Ulcerative colitis Sister    Diabetes Maternal Aunt    Diabetes Paternal Aunt    Diabetes Maternal Grandmother    Diabetes Paternal Grandmother    Heart disease Paternal Grandmother    Colitis Paternal Grandfather    Colon cancer Neg Hx    Esophageal cancer Neg Hx    Pancreatic cancer Neg Hx    Rectal cancer Neg Hx    Stomach cancer Neg Hx    SOCIAL HISTORY Social History   Tobacco Use   Smoking status: Never   Smokeless tobacco: Never   Tobacco comments:    AS A Teenager  Vaping Use   Vaping Use: Never used  Substance Use Topics   Alcohol use: Yes    Alcohol/week: 0.0 standard drinks    Comment:  occasional wine   Drug use: No       OPHTHALMIC EXAM: Base Eye Exam     Visual Acuity (Snellen - Linear)       Right Left   Dist cc 20/70 +2 20/25 +1   Dist ph cc NI 20/20 -2    Correction: Glasses  The vision in OD was from looking to the side peripherally. Looking straight vision was 800 OD        Tonometry (Tonopen, 10:43 AM)       Right Left   Pressure 48 18         Tonometry #2       Right Left   Pressure 46          Tonometry Comments   Brimonidine and Cosopt given at 10:42        Pupils       Dark Light Shape React APD   Right 6 6 Irregular NR None   Left 4 3 Round Brisk None         Visual Fields       Left Right    Full    Restrictions  Partial inner superior temporal, inferior temporal, superior nasal, inferior nasal deficiencies         Extraocular Movement  Right Left    Full, Ortho  Full, Ortho         Neuro/Psych     Oriented x3: Yes   Mood/Affect: Normal         Dilation     Both eyes: 1.0% Mydriacyl, 2.5% Phenylephrine @ 10:40 AM           Slit Lamp and Fundus Exam     Slit Lamp Exam       Right Left   Lids/Lashes Dermatochalasis - upper lid, Ecchymosis Dermatochalasis - upper lid   Conjunctiva/Sclera Mild Conjunctivochalasis inferiorly temporal pinguecula   Cornea trace PEE, trace micro cystic edema Clear   Anterior Chamber Deep, 4+RBC, 5m layered hyphema inferiorly Deep and quiet   Iris Round and dilated Round and reactive   Lens 2+ Nuclear sclerosis, 2+ Cortical cataract 2+ Nuclear sclerosis, 2+ Cortical cataract   Vitreous Vitreous syneresis          Fundus Exam       Right Left   Disc hazy view, Pink and Sharp, Compact    C/D Ratio 0.1    Macula hazy view, Flat, Blunted foveal reflex    Vessels mild attenuation    Periphery Attached               Refraction     Wearing Rx       Sphere Cylinder Axis Add   Right -7.50 +1.00 022 +1.75   Left -6.50 +0.75 179 +1.75           IMAGING AND PROCEDURES  Imaging and Procedures for 07/30/2021  OCT, Retina - OU - Both Eyes       Right Eye Quality was good. Central Foveal Thickness: 264. Progression has no prior data. Findings include myopic contour, normal foveal contour, no IRF, no SRF.   Left Eye Quality was good. Progression has no prior data. Findings include normal foveal contour, no IRF, no SRF.   Notes *Images captured and stored on drive  Diagnosis / Impression:  NFP, no IRF/SRF OU  Clinical management:  See below  Abbreviations: NFP - Normal foveal profile. CME - cystoid macular edema. PED - pigment epithelial detachment. IRF - intraretinal fluid. SRF - subretinal fluid. EZ - ellipsoid zone. ERM - epiretinal membrane. ORA - outer retinal atrophy. ORT - outer retinal tubulation. SRHM - subretinal hyper-reflective material. IRHM - intraretinal  hyper-reflective material            ASSESSMENT/PLAN:    ICD-10-CM   1. Eye injury, initial encounter  S05.90XA     2. Hyphema of right eye  H21.01     3. 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 Retinal edema  H35.81 OCT, Retina - OU - Both Eyes    5. Essential hypertension  I10     6. Hypertensive retinopathy of both eyes  H35.033     7. Combined forms of age-related cataract of both eyes  H25.813       1-3. Eye Injury w/ traumatic hyphema and ocular hypertension OD  - hit in eye with bungee cord while strapping something on a truck on 10.26.22  [facility] with Dr. SNancy Fetter - pt was started on brimonidine BID, timpotic BID and PF QID in the ED  - BCVA 20/70 OD, IOP 48  - exam shows 114mlayered hyphema, 4+RBC in [name]+microcystic edema of cornea and traumatic mydriasis  - contin brim BID OD -  will switch timpotic to Cosopt BID and increase PF to 6x/day  - add po diamox ER 574m BID   - strict bedrest  [facility]  4. No retinal edema on exam or OCT   5,6. Hypertensive retinopathy OU - discussed importance of tight BP control - monitor  7. Mixed Cataract OU - The symptoms of cataract, surgical options, and treatments and risks were discussed with patient. - discussed diagnosis and progression - not yet visually significant - monitor for now  Ophthalmic Meds Ordered this visit:  Meds ordered this encounter  Medications   acetaZOLAMIDE ER (DIAMOX) 500 MG capsule    Sig: Take 1 capsule (500 mg total) by mouth 2 (two) times daily.    Dispense:  60 capsule    Refill:  0     Return in about 1 day (around 07/31/2021) for f/u eye injury OD, DFE.  There are no Patient Instructions on file for this visit.  This document serves as a record of services personally performed by BGardiner Sleeper MD, PhD. It was created on their behalf by ALeeann Must [place] an ophthalmic technician. The creation of this record is the  provider's dictation and/or activities during the visit.    Electronically signed by: ALeeann Must COA '@TODAY' @ 9:17 AM  This document serves as a record of services personally performed by BGardiner Sleeper MD, PhD. It was created on their behalf by ASan Jetty BOwens Shark OA an ophthalmic technician. The creation of this record is the provider's dictation and/or activities during the visit.    Electronically signed by: ASan Jetty BOwens Shark ONew [place]10.27.2022 9:17 AM  BGardiner Sleeper M.D., Ph.D. Diseases & Surgery of the Retina and VHastings10/27/2022   I have reviewed the above documentation for accuracy and completeness, and I agree with the above. BGardiner Sleeper M.D., Ph.D. 07/31/21 9:17 AM   Abbreviations: M myopia (nearsighted); A astigmatism; H hyperopia (farsighted); P presbyopia; Mrx spectacle prescription;  CTL contact lenses; OD right eye; OS left eye; OU both eyes  XT exotropia; ET esotropia; PEK punctate epithelial keratitis; PEE punctate epithelial erosions; DES dry eye syndrome; MGD meibomian gland dysfunction; ATs artificial tears; PFAT's preservative free artificial tears; NGlidenuclear sclerotic cataract; PSC posterior subcapsular cataract; ERM epi-retinal membrane; PVD posterior vitreous detachment; RD retinal detachment; DM diabetes mellitus; DR diabetic retinopathy; NPDR non-proliferative diabetic retinopathy; PDR proliferative diabetic retinopathy; CSME clinically significant macular edema; DME diabetic macular edema; dbh dot blot hemorrhages; CWS cotton wool spot; POAG primary open angle glaucoma; C/D cup-to-disc ratio; HVF humphrey visual field; GVF goldmann visual field; OCT optical coherence tomography; IOP intraocular pressure; BRVO Branch retinal vein occlusion; CRVO central retinal vein occlusion; CRAO central retinal artery occlusion; BRAO branch retinal artery occlusion; RT retinal tear; SB scleral buckle; PPV pars plana vitrectomy; VH Vitreous  hemorrhage; PRP panretinal laser photocoagulation; IVK intravitreal kenalog; VMT vitreomacular traction; MH Macular hole;  NVD neovascularization of the disc; NVE neovascularization elsewhere; AREDS age related eye disease study; ARMD age related macular degeneration; POAG primary open angle glaucoma; EBMD epithelial/anterior basement membrane dystrophy; ACIOL anterior chamber intraocular lens; IOL intraocular lens; PCIOL posterior chamber intraocular lens; Phaco/IOL phacoemulsification with intraocular lens placement; PLacoocheephotorefractive keratectomy; LASIK laser assisted in situ keratomileusis; HTN hypertension; DM diabetes mellitus; COPD chronic obstructive pulmonary disease

## 2021-07-31 ENCOUNTER — Encounter (INDEPENDENT_AMBULATORY_CARE_PROVIDER_SITE_OTHER): Payer: Self-pay | Admitting: Ophthalmology

## 2021-07-31 ENCOUNTER — Ambulatory Visit (INDEPENDENT_AMBULATORY_CARE_PROVIDER_SITE_OTHER): Payer: No Typology Code available for payment source | Admitting: Ophthalmology

## 2021-07-31 DIAGNOSIS — S0591XD Unspecified injury of right eye and orbit, subsequent encounter: Secondary | ICD-10-CM

## 2021-07-31 DIAGNOSIS — H35033 Hypertensive retinopathy, bilateral: Secondary | ICD-10-CM | POA: Diagnosis not present

## 2021-07-31 DIAGNOSIS — H25813 Combined forms of age-related cataract, bilateral: Secondary | ICD-10-CM

## 2021-07-31 DIAGNOSIS — S0590XA Unspecified injury of unspecified eye and orbit, initial encounter: Secondary | ICD-10-CM

## 2021-07-31 DIAGNOSIS — H40051 Ocular hypertension, right eye: Secondary | ICD-10-CM | POA: Diagnosis not present

## 2021-07-31 DIAGNOSIS — H2101 Hyphema, right eye: Secondary | ICD-10-CM | POA: Diagnosis not present

## 2021-07-31 DIAGNOSIS — I10 Essential (primary) hypertension: Secondary | ICD-10-CM | POA: Diagnosis not present

## 2021-07-31 DIAGNOSIS — H3581 Retinal edema: Secondary | ICD-10-CM

## 2021-07-31 NOTE — Progress Notes (Signed)
[facility] Note  07/31/2021     CHIEF COMPLAINT Patient presents for Retina Follow Up   HISTORY OF PRESENT ILLNESS: [name] is a 60 y.o. female who presents to the clinic today for:   HPI     Retina Follow Up   Diagnosis: Traumatic eye injury and ocular hypertension.  In right eye.  Severity is moderate.  Duration of 1 day.  I, the attending physician,  performed the HPI with the patient and updated documentation appropriately.        Comments   Patient states vision improving OD. Pain also improving OD. Using diamox 575m po bid, cosopt  tid, brimonidine tid, and pred forte 6 times daily OD.       Last edited by ZBernarda Caffey MD on 08/02/2021  1:53 AM.    Pt states her vision is much better today and she is not in any pain  Referring physician: GJanora Norlander DO [address]  [place] [id number] HISTORICAL INFORMATION:   Selected notes from the MEDICAL RECORD NUMBER Referred by Dr. SNancy Fetter-- ED f/u for traumatic hyphema following bungee cord trauma LEE:  Ocular Hx- PMH-    CURRENT MEDICATIONS: Current Outpatient Medications (Ophthalmic Drugs)  Medication Sig   brimonidine (ALPHAGAN) 0.15 % ophthalmic solution Place 1 drop into the right eye 3 (three) times daily.   dorzolamide-timolol (COSOPT) 22.3-6.8 MG/ML ophthalmic solution Place 1 drop into the right eye 3 (three) times daily.   No current facility-administered medications for this visit. (Ophthalmic Drugs)   Current Outpatient Medications (Other)  Medication Sig   acetaminophen (TYLENOL) 500 MG tablet Take 1 tablet (500 mg total) by mouth every 6 (six) hours as needed.   acetaZOLAMIDE ER (DIAMOX) 500 MG capsule Take 1 capsule (500 mg total) by mouth 2 (two) times daily.   aspirin EC 81 MG tablet Take 81 mg by mouth daily.   benzonatate (TESSALON PERLES) 100 MG capsule Take 1 capsule (100 mg total) by mouth 3 (three) times daily as needed for cough.   Blood  Glucose Monitoring Suppl (FREESTYLE FREEDOM LITE) w/Device KIT Test blood sugar BID Dx E11.9   furosemide (LASIX) 40 MG tablet TAKE 1 TABLET BY MOUTH DAILY.   glucose blood (FREESTYLE LITE) test strip Test blood sugar BID Dx E11.9   Lancets (FREESTYLE) lancets Test blood sugar BID Dx E11.9   metFORMIN (GLUCOPHAGE) 500 MG tablet TAKE 1 TABLET BY MOUTH 2 TIMES DAILY   multivitamin-iron-minerals-folic acid (CENTRUM) chewable tablet Chew 1 tablet by mouth daily.   omeprazole (PRILOSEC) 20 MG capsule Take 20 mg by mouth daily.   Pitavastatin Calcium (LIVALO) 2 MG TABS TAKE 1 TABLET BY MOUTH AT BEDTIME.   ramipril (ALTACE) 10 MG capsule TAKE 2 CAPSULES BY MOUTH ONCE A DAY   ALPRAZolam (XANAX) 0.5 MG tablet Take 1 tablet by mouth 2 times daily as needed for anxiety (flight). (Patient not taking: No sig reported)   No current facility-administered medications for this visit. (Other)   REVIEW OF SYSTEMS: ROS   Positive for: Eyes Negative for: Constitutional, Gastrointestinal, Neurological, Skin, Genitourinary, Musculoskeletal, HENT, Endocrine, Cardiovascular, Respiratory, Psychiatric, Allergic/Imm, Heme/Lymph Last edited by BRoselee NovaD, COT on 07/31/2021  8:13 AM.     ALLERGIES Allergies  Allergen Reactions   Erythromycin Other (See Comments)    Abdominal pain   Codeine Other (See Comments)    Hyper    Sulfa Antibiotics Hives   Tetracycline Rash  PAST MEDICAL HISTORY Past Medical History:  Diagnosis Date   Allergy    seasonal   Anxiety    Arthritis    Diabetes mellitus    type ii   GERD (gastroesophageal reflux disease)    Hiatal hernia    Hyperlipidemia    Hypertension    Irritable bowel syndrome    Obesity    Sinus infection 08/14/2017   Symptoms resolved after antiobiotic course   Tubular adenoma of colon 05/27/2015   Ulcer    Past Surgical History:  Procedure Laterality Date   CHOLECYSTECTOMY  2009   COLONOSCOPY     POLYPECTOMY     FAMILY HISTORY Family  History  Problem Relation Age of Onset   Diabetes Mother    Kidney disease Mother    Colitis Father    Colon polyps Father    Heart disease Father    Irritable bowel syndrome Father    Squamous cell carcinoma Father    Other Other        corkscrew esophagus- father, sister   Crohn's disease Sister    Colitis Sister    Colon polyps Sister    Ulcerative colitis Sister    Diabetes Maternal Aunt    Diabetes Paternal Aunt    Diabetes Maternal Grandmother    Diabetes Paternal Grandmother    Heart disease Paternal Grandmother    Colitis Paternal Grandfather    Colon cancer Neg Hx    Esophageal cancer Neg Hx    Pancreatic cancer Neg Hx    Rectal cancer Neg Hx    Stomach cancer Neg Hx    SOCIAL HISTORY Social History   Tobacco Use   Smoking status: Never   Smokeless tobacco: Never   Tobacco comments:    AS A Teenager  Vaping Use   Vaping Use: Never used  Substance Use Topics   Alcohol use: Yes    Alcohol/week: 0.0 standard drinks    Comment:  occasional wine   Drug use: No       OPHTHALMIC EXAM: Base Eye Exam     Visual Acuity (Snellen - Linear)       Right Left   Dist cc 20/50 20/20   Dist ph cc 20/40 -2 NI    Correction: Glasses         Tonometry (Tonopen, 8:21 AM)       Right Left   Pressure 18 17         Pupils       Dark Light Shape React APD   Right 6 6 Round NR None   Left 4 3 Round Brisk None         Visual Fields (Counting fingers)       Left Right    Full Full  Central scotoma not present today        Extraocular Movement       Right Left    Full, Ortho Full, Ortho         Neuro/Psych     Oriented x3: Yes   Mood/Affect: Normal         Dilation     Both eyes: 1.0% Mydriacyl, 2.5% Phenylephrine @ 8:21 AM           Slit Lamp and Fundus Exam     Slit Lamp Exam       Right Left   Lids/Lashes Dermatochalasis - upper lid, Ecchymosis Dermatochalasis - upper lid   Conjunctiva/Sclera White and quiet temporal  pinguecula  Cornea trace PEE, trace micro cystic edema - improved, trace Descemet's folds Clear   Anterior Chamber Deep, 3-4+cell, pigment and RBC, <58m layered hyphema inferiorly Deep and quiet   Iris Round and dilated Round and reactive   Lens 2+ Nuclear sclerosis, 2+ Cortical cataract 2+ Nuclear sclerosis, 2+ Cortical cataract   Vitreous Vitreous syneresis, Posterior vitreous detachment          Fundus Exam       Right Left   Disc Pink and Sharp, Compact, tilted, mild temporal PPA    C/D Ratio 0.1    Macula Flat, Blunted foveal reflex, mild RPE mottling    Vessels mild attenuation, mild tortuousity    Periphery Attached               Refraction     Wearing Rx       Sphere Cylinder Axis Add   Right -7.50 +1.00 022 +1.75   Left -6.50 +0.75 179 +1.75           IMAGING AND PROCEDURES  Imaging and Procedures for 07/31/2021  OCT, Retina - OU - Both Eyes       Right Eye Quality was good. Central Foveal Thickness: 260. Progression has been stable. Findings include myopic contour, normal foveal contour, no IRF, no SRF.   Left Eye Quality was good. Central Foveal Thickness: 260. Progression has been stable. Findings include normal foveal contour, no IRF, no SRF, myopic contour.   Notes *Images captured and stored on drive  Diagnosis / Impression:  NFP, no IRF/SRF OU  Clinical management:  See below  Abbreviations: NFP - Normal foveal profile. CME - cystoid macular edema. PED - pigment epithelial detachment. IRF - intraretinal fluid. SRF - subretinal fluid. EZ - ellipsoid zone. ERM - epiretinal membrane. ORA - outer retinal atrophy. ORT - outer retinal tubulation. SRHM - subretinal hyper-reflective material. IRHM - intraretinal hyper-reflective material            ASSESSMENT/PLAN:    ICD-10-CM   1. Right eye injury, subsequent encounter  S05.91XD     2. Hyphema of right eye  H21.01     3. 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 Retinal  edema  H35.81 OCT, Retina - OU - Both Eyes    5. Essential hypertension  I10     6. Hypertensive retinopathy of both eyes  H35.033     7. Combined forms of age-related cataract of both eyes  H25.813       1-3. Eye Injury w/ traumatic hyphema and ocular hypertension OD  - hit in eye with bungee cord while strapping something on a truck on 10.26.22  [facility] consulted with Dr. SNancy Fetter - pt was initially started on brimonidine BID, timpotic BID and PF QID in the ED  - 10.27.22 -- presented here started on brim and cosopt TID OD + PF 6x/day  - BCVA improved to 20/40 from 20/70 OD, IOP improved to 18 from 48  - exam also improved: shows <172mlayered hyphema, 3-4+[facility]BC [facility]microcystic edema of cornea improved  - cont brim and Cosopt TID OD; PF  6x/day OD  - decrease po diamox ER 50022mo Qdaily   - strict bedrest  - f/u Monday, IOP / hyphema check  4. No retinal edema on exam or OCT   5,6. Hypertensive retinopathy OU - discussed importance of tight BP control - monitor  7. Mixed Cataract  OU - The symptoms of cataract, surgical options, and treatments and risks were discussed with patient. - discussed diagnosis and progression - not yet visually significant - monitor for now  Ophthalmic Meds Ordered this visit:  No orders of the defined types were placed in this encounter.    Return in about 3 days (around 08/03/2021) for hyphema OD - Dilated Exam.  There are no Patient Instructions on file for this visit.  This document serves as a record of services personally performed by Gardiner Sleeper, MD, PhD. It was created on their behalf by San Jetty. Owens Shark, OA an ophthalmic technician. The creation of this record is the provider's dictation and/or activities during the visit.    Electronically signed by: San Jetty. Owens Shark, [place] 10.28.2022 1:59 AM   Gardiner Sleeper, M.D., Ph.D. Diseases & Surgery of the Retina and Vitreous [facility]  I have reviewed the above documentation for accuracy and completeness, and I agree with the above. Gardiner Sleeper, M.D., Ph.D. 08/02/21 1:59 AM   Abbreviations: M myopia (nearsighted); A astigmatism; H hyperopia (farsighted); P presbyopia; Mrx spectacle prescription;  CTL contact lenses; OD right eye; OS left eye; OU both eyes  XT exotropia; ET esotropia; PEK punctate epithelial keratitis; PEE punctate epithelial erosions; DES dry eye syndrome; MGD meibomian gland dysfunction; ATs artificial tears; PFAT's preservative free artificial tears; [place] nuclear sclerotic cataract; PSC posterior subcapsular cataract; ERM epi-retinal membrane; PVD posterior vitreous detachment; RD retinal detachment; DM diabetes mellitus; DR diabetic retinopathy; NPDR non-proliferative diabetic retinopathy; PDR proliferative diabetic retinopathy; CSME clinically significant macular edema; DME diabetic macular edema; dbh dot blot hemorrhages; CWS cotton wool spot; POAG primary open angle glaucoma; C/D cup-to-disc ratio; HVF humphrey visual field; GVF goldmann visual field; OCT optical coherence tomography; IOP intraocular pressure; BRVO Branch retinal vein occlusion; CRVO central retinal vein occlusion; CRAO central retinal artery occlusion; BRAO branch retinal artery occlusion; RT retinal tear; SB scleral buckle; PPV pars plana vitrectomy; VH Vitreous hemorrhage; PRP panretinal laser photocoagulation; IVK intravitreal kenalog; VMT vitreomacular traction; MH Macular hole;  NVD neovascularization of the disc; NVE neovascularization elsewhere; AREDS age related eye disease study; ARMD age related macular degeneration; POAG primary open angle glaucoma; EBMD epithelial/anterior basement membrane dystrophy; ACIOL anterior chamber intraocular lens; IOL intraocular lens; PCIOL posterior chamber intraocular lens; Phaco/IOL phacoemulsification with intraocular lens placement; [place] photorefractive keratectomy; LASIK laser assisted in situ  keratomileusis; HTN hypertension; DM diabetes mellitus; COPD chronic obstructive pulmonary disease

## 2021-08-02 ENCOUNTER — Encounter (INDEPENDENT_AMBULATORY_CARE_PROVIDER_SITE_OTHER): Payer: Self-pay | Admitting: Ophthalmology

## 2021-08-03 ENCOUNTER — Encounter (INDEPENDENT_AMBULATORY_CARE_PROVIDER_SITE_OTHER): Payer: Self-pay | Admitting: Ophthalmology

## 2021-08-03 ENCOUNTER — Other Ambulatory Visit: Payer: Self-pay

## 2021-08-03 ENCOUNTER — Ambulatory Visit (INDEPENDENT_AMBULATORY_CARE_PROVIDER_SITE_OTHER): Payer: No Typology Code available for payment source | Admitting: Ophthalmology

## 2021-08-03 DIAGNOSIS — H35033 Hypertensive retinopathy, bilateral: Secondary | ICD-10-CM

## 2021-08-03 DIAGNOSIS — I10 Essential (primary) hypertension: Secondary | ICD-10-CM | POA: Diagnosis not present

## 2021-08-03 DIAGNOSIS — H2101 Hyphema, right eye: Secondary | ICD-10-CM | POA: Diagnosis not present

## 2021-08-03 DIAGNOSIS — S0591XD Unspecified injury of right eye and orbit, subsequent encounter: Secondary | ICD-10-CM

## 2021-08-03 DIAGNOSIS — S0590XA Unspecified injury of unspecified eye and orbit, initial encounter: Secondary | ICD-10-CM

## 2021-08-03 DIAGNOSIS — H3581 Retinal edema: Secondary | ICD-10-CM

## 2021-08-03 DIAGNOSIS — H25813 Combined forms of age-related cataract, bilateral: Secondary | ICD-10-CM

## 2021-08-03 DIAGNOSIS — H40051 Ocular hypertension, right eye: Secondary | ICD-10-CM

## 2021-08-03 NOTE — Progress Notes (Signed)
[facility] Note  08/03/2021     CHIEF COMPLAINT Patient presents for Retina Follow Up   HISTORY OF PRESENT ILLNESS: [name] is a 60 y.o. female who presents to the clinic today for:   HPI     Retina Follow Up   Patient presents with  Other.  In right eye.  This started 3 days ago.  I, the attending physician,  performed the HPI with the patient and updated documentation appropriately.        Comments   Patient here for 3 da;ys follow up for eye injury OD with traumatic hyphema. Patient states vision much better. Not 100%. Eye seems to be getting smaller. Almost same size as OS. No eye pain.       Last edited by Bernarda Caffey, MD on 08/04/2021 12:18 AM.     Pt states she was able to work over the weekend, she states her vision has not changed and she is not having any sensitivity to light  Referring physician: [name], [place],  [place] [id number]  HISTORICAL INFORMATION:   Selected notes from the [place] Referred by Dr. Nancy Fetter -- ED f/u for traumatic hyphema following bungee cord trauma LEE:  Ocular Hx- PMH-    CURRENT MEDICATIONS: Current Outpatient Medications (Ophthalmic Drugs)  Medication Sig   brimonidine (ALPHAGAN) 0.15 % ophthalmic solution Place 1 drop into the right eye 3 (three) times daily.   dorzolamide-timolol (COSOPT) 22.3-6.8 MG/ML ophthalmic solution Place 1 drop into the right eye 3 (three) times daily.   No current facility-administered medications for this visit. (Ophthalmic Drugs)   Current Outpatient Medications (Other)  Medication Sig   acetaminophen (TYLENOL) 500 MG tablet Take 1 tablet (500 mg total) by mouth every 6 (six) hours as needed.   acetaZOLAMIDE ER (DIAMOX) 500 MG capsule Take 1 capsule (500 mg total) by mouth 2 (two) times daily.   ALPRAZolam (XANAX) 0.5 MG tablet Take 1 tablet by mouth 2 times daily as needed for anxiety (flight). (Patient not taking: No sig  reported)   aspirin EC 81 MG tablet Take 81 mg by mouth daily.   benzonatate (TESSALON PERLES) 100 MG capsule Take 1 capsule (100 mg total) by mouth 3 (three) times daily as needed for cough.   Blood Glucose Monitoring Suppl (FREESTYLE FREEDOM LITE) w/Device KIT Test blood sugar BID Dx E11.9   furosemide (LASIX) 40 MG tablet TAKE 1 TABLET BY MOUTH DAILY.   glucose blood (FREESTYLE LITE) test strip Test blood sugar BID Dx E11.9   Lancets (FREESTYLE) lancets Test blood sugar BID Dx E11.9   metFORMIN (GLUCOPHAGE) 500 MG tablet TAKE 1 TABLET BY MOUTH 2 TIMES DAILY   multivitamin-iron-minerals-folic acid (CENTRUM) chewable tablet Chew 1 tablet by mouth daily.   omeprazole (PRILOSEC) 20 MG capsule Take 20 mg by mouth daily.   Pitavastatin Calcium (LIVALO) 2 MG TABS TAKE 1 TABLET BY MOUTH AT BEDTIME.   ramipril (ALTACE) 10 MG capsule TAKE 2 CAPSULES BY MOUTH ONCE A DAY   No current facility-administered medications for this visit. (Other)   REVIEW OF SYSTEMS: ROS   Positive for: Eyes Negative for: Constitutional, Gastrointestinal, Neurological, Skin, Genitourinary, Musculoskeletal, HENT, Endocrine, Cardiovascular, Respiratory, Psychiatric, Allergic/Imm, Heme/Lymph Last edited by Theodore Demark, COA on 08/03/2021  8:20 AM.     ALLERGIES Allergies  Allergen Reactions   Erythromycin Other (See Comments)    Abdominal pain   Codeine Other (See Comments)  Hyper    Sulfa Antibiotics Hives   Tetracycline Rash   PAST MEDICAL HISTORY Past Medical History:  Diagnosis Date   Allergy    seasonal   Anxiety    Arthritis    Diabetes mellitus    type ii   GERD (gastroesophageal reflux disease)    Hiatal hernia    Hyperlipidemia    Hypertension    Irritable bowel syndrome    Obesity    Sinus infection 08/14/2017   Symptoms resolved after antiobiotic course   Tubular adenoma of colon 05/27/2015   Ulcer    Past Surgical History:  Procedure Laterality Date   CHOLECYSTECTOMY  2009    COLONOSCOPY     POLYPECTOMY     FAMILY HISTORY Family History  Problem Relation Age of Onset   Diabetes Mother    Kidney disease Mother    Colitis Father    Colon polyps Father    Heart disease Father    Irritable bowel syndrome Father    Squamous cell carcinoma Father    Other Other        corkscrew esophagus- father, sister   Crohn's disease Sister    Colitis Sister    Colon polyps Sister    Ulcerative colitis Sister    Diabetes Maternal Aunt    Diabetes Paternal Aunt    Diabetes Maternal Grandmother    Diabetes Paternal Grandmother    Heart disease Paternal Grandmother    Colitis Paternal Grandfather    Colon cancer Neg Hx    Esophageal cancer Neg Hx    Pancreatic cancer Neg Hx    Rectal cancer Neg Hx    Stomach cancer Neg Hx    SOCIAL HISTORY Social History   Tobacco Use   Smoking status: Never   Smokeless tobacco: Never   Tobacco comments:    AS A Teenager  Vaping Use   Vaping Use: Never used  Substance Use Topics   Alcohol use: Yes    Alcohol/week: 0.0 standard drinks    Comment:  occasional wine   Drug use: No       OPHTHALMIC EXAM: Base Eye Exam     Visual Acuity (Snellen - Linear)       Right Left   Dist cc 20/40 -1 20/25 -2   Dist ph cc NI 20/20 -2    Correction: Glasses         Tonometry (Tonopen, 8:15 AM)       Right Left   Pressure 16 17         Pupils       Dark Light Shape React APD   Right 5 4 Round Minimal None   Left 4 3 Round Brisk None         Visual Fields (Counting fingers)       Left Right    Full Full         Extraocular Movement       Right Left    Full, Ortho Full, Ortho         Neuro/Psych     Oriented x3: Yes   Mood/Affect: Normal         Dilation     Both eyes: 1.0% Mydriacyl, 2.5% Phenylephrine @ 8:14 AM           Slit Lamp and Fundus Exam     Slit Lamp Exam       Right Left   Lids/Lashes Dermatochalasis - upper lid, Ecchymosis Dermatochalasis - upper lid  Conjunctiva/Sclera White and quiet temporal pinguecula   Cornea 1+PEE, trace micro cystic edema - improved, trace Descemet's folds, endo heme Clear   Anterior Chamber Deep, 3-4+cell, pigment and RBC, <61m layered hyphema inferiorly Deep and quiet   Iris Round and dilated Round and reactive   Lens 2+ Nuclear sclerosis, 2+ Cortical cataract 2+ Nuclear sclerosis, 2+ Cortical cataract   Vitreous Vitreous syneresis, Posterior vitreous detachment          Fundus Exam       Right Left   Disc Pink and Sharp, Compact, tilted, mild temporal PPA    C/D Ratio 0.1    Macula Flat, Blunted foveal reflex, mild RPE mottling    Vessels mild attenuation, mild tortuousity    Periphery Attached               Refraction     Wearing Rx       Sphere Cylinder Axis Add   Right -7.50 +1.00 022 +1.75   Left -6.50 +0.75 179 +1.75           IMAGING AND PROCEDURES  Imaging and Procedures for 08/03/2021  OCT, Retina - OU - Both Eyes       Right Eye Quality was good. Central Foveal Thickness: 264. Progression has been stable. Findings include myopic contour, normal foveal contour, no IRF, no SRF.   Left Eye Quality was good. Central Foveal Thickness: 264. Progression has been stable. Findings include normal foveal contour, no IRF, no SRF, myopic contour.   Notes *Images captured and stored on drive  Diagnosis / Impression:  NFP, no IRF/SRF OU  Clinical management:  See below  Abbreviations: NFP - Normal foveal profile. CME - cystoid macular edema. PED - pigment epithelial detachment. IRF - intraretinal fluid. SRF - subretinal fluid. EZ - ellipsoid zone. ERM - epiretinal membrane. ORA - outer retinal atrophy. ORT - outer retinal tubulation. SRHM - subretinal hyper-reflective material. IRHM - intraretinal hyper-reflective material             ASSESSMENT/PLAN:    ICD-10-CM   1. Right eye injury, subsequent encounter  S05.91XD     2. Hyphema of right eye  H21.01     3. Ocular  hypertension of right eye  H40.051     4. Retinal edema  H35.81 OCT, Retina - OU - Both Eyes    5. Essential hypertension  I10     6. Hypertensive retinopathy of both eyes  H35.033     7. Combined forms of age-related cataract of both eyes  H25.813       1-3. Eye Injury w/ traumatic hyphema and ocular hypertension OD  - hit in eye with bungee cord while strapping something on a truck on 10.26.22  [facility] consulted with Dr. SNancy Fetter - pt was initially started on brimonidine BID, timpotic BID and PF QID in the ED  - 10.27.22 -- presented here started on brim and cosopt TID OD + PF 6x/day  - BCVA stable at 20/40 OD, IOP good at 16  - exam also improved: shows <151mlayered hyphema, 3-4+[facility]microcystic edema of cornea improved  - cont brim and Cosopt TID OD; PF  6x/day OD  - decrease po diamox ER 50015mo Qdaily -- okay to stop  - strict bedrest  - f/u [facility] at 8:00, IOP / hyphema check  4. No retinal edema on exam or OCT   5,6. Hypertensive retinopathy OU - discussed  importance of tight BP control - monitor  7. Mixed Cataract OU - The symptoms of cataract, surgical options, and treatments and risks were discussed with patient. - discussed diagnosis and progression - not yet visually significant - monitor for now  Ophthalmic Meds Ordered this visit:  No orders of the defined types were placed in this encounter.    Return in about 2 days (around 08/05/2021) for f/u eye injury OD, DFE, OCT.  There are no Patient Instructions on file for this visit.  This document serves as a record of services personally performed by Gardiner Sleeper, MD, PhD. It was created on their behalf by San Jetty. Owens Shark, OA an ophthalmic technician. The creation of this record is the provider's dictation and/or activities during the visit.    Electronically signed by: San Jetty. Owens Shark, [place] 10.31.2022 12:46 AM  Gardiner Sleeper, M.D., Ph.D. Diseases & Surgery of the  Retina and Vitreous [facility]  I have reviewed the above documentation for accuracy and completeness, and I agree with the above. Gardiner Sleeper, M.D., Ph.D. 08/04/21 12:46 AM   Abbreviations: M myopia (nearsighted); A astigmatism; H hyperopia (farsighted); P presbyopia; Mrx spectacle prescription;  CTL contact lenses; OD right eye; OS left eye; OU both eyes  XT exotropia; ET esotropia; PEK punctate epithelial keratitis; PEE punctate epithelial erosions; DES dry eye syndrome; MGD meibomian gland dysfunction; ATs artificial tears; PFAT's preservative free artificial tears; [place] nuclear sclerotic cataract; PSC posterior subcapsular cataract; ERM epi-retinal membrane; PVD posterior vitreous detachment; RD retinal detachment; DM diabetes mellitus; DR diabetic retinopathy; NPDR non-proliferative diabetic retinopathy; PDR proliferative diabetic retinopathy; CSME clinically significant macular edema; DME diabetic macular edema; dbh dot blot hemorrhages; CWS cotton wool spot; POAG primary open angle glaucoma; C/D cup-to-disc ratio; HVF humphrey visual field; GVF goldmann visual field; OCT optical coherence tomography; IOP intraocular pressure; BRVO Branch retinal vein occlusion; CRVO central retinal vein occlusion; CRAO central retinal artery occlusion; BRAO branch retinal artery occlusion; RT retinal tear; SB scleral buckle; PPV pars plana vitrectomy; VH Vitreous hemorrhage; PRP panretinal laser photocoagulation; IVK intravitreal kenalog; VMT vitreomacular traction; MH Macular hole;  NVD neovascularization of the disc; NVE neovascularization elsewhere; AREDS age related eye disease study; ARMD age related macular degeneration; POAG primary open angle glaucoma; EBMD epithelial/anterior basement membrane dystrophy; ACIOL anterior chamber intraocular lens; IOL intraocular lens; PCIOL posterior chamber intraocular lens; Phaco/IOL phacoemulsification with intraocular lens placement; [place]  photorefractive keratectomy; LASIK laser assisted in situ keratomileusis; HTN hypertension; DM diabetes mellitus; COPD chronic obstructive pulmonary disease

## 2021-08-04 ENCOUNTER — Encounter (INDEPENDENT_AMBULATORY_CARE_PROVIDER_SITE_OTHER): Payer: Self-pay | Admitting: Ophthalmology

## 2021-08-05 ENCOUNTER — Encounter (INDEPENDENT_AMBULATORY_CARE_PROVIDER_SITE_OTHER): Payer: Self-pay | Admitting: Ophthalmology

## 2021-08-05 ENCOUNTER — Other Ambulatory Visit: Payer: Self-pay

## 2021-08-05 ENCOUNTER — Ambulatory Visit (INDEPENDENT_AMBULATORY_CARE_PROVIDER_SITE_OTHER): Payer: No Typology Code available for payment source | Admitting: Ophthalmology

## 2021-08-05 ENCOUNTER — Other Ambulatory Visit (HOSPITAL_COMMUNITY): Payer: Self-pay

## 2021-08-05 DIAGNOSIS — S0591XD Unspecified injury of right eye and orbit, subsequent encounter: Secondary | ICD-10-CM

## 2021-08-05 DIAGNOSIS — H35033 Hypertensive retinopathy, bilateral: Secondary | ICD-10-CM

## 2021-08-05 DIAGNOSIS — H25813 Combined forms of age-related cataract, bilateral: Secondary | ICD-10-CM

## 2021-08-05 DIAGNOSIS — H2101 Hyphema, right eye: Secondary | ICD-10-CM | POA: Diagnosis not present

## 2021-08-05 DIAGNOSIS — I10 Essential (primary) hypertension: Secondary | ICD-10-CM

## 2021-08-05 DIAGNOSIS — H40051 Ocular hypertension, right eye: Secondary | ICD-10-CM

## 2021-08-05 DIAGNOSIS — H3581 Retinal edema: Secondary | ICD-10-CM

## 2021-08-05 MED ORDER — CARESTART COVID-19 HOME TEST VI KIT
PACK | 0 refills | Status: DC
  Filled 2021-08-05: qty 4, 4d supply, fill #0

## 2021-08-05 MED ORDER — PREDNISOLONE ACETATE 1 % OP SUSP
1.0000 [drp] | Freq: Every day | OPHTHALMIC | 0 refills | Status: DC
  Filled 2021-08-05: qty 15, 40d supply, fill #0

## 2021-08-05 NOTE — Progress Notes (Signed)
[facility] Note  08/05/2021     CHIEF COMPLAINT Patient presents for Retina Follow Up   HISTORY OF PRESENT ILLNESS: [name] is a 60 y.o. female who presents to the clinic today for:   HPI     Retina Follow Up   Patient presents with  Other.  In right eye.  This started 2 days ago.  I, the attending physician,  performed the HPI with the patient and updated documentation appropriately.        Comments   Patient here for 2 days retina follow up for eye injury OD. Patient states vision doing good. A lot better. Got most current glasses fixed. Eyes stay dilated a very long time.       Last edited by Bernarda Caffey, MD on 08/05/2021  8:27 AM.    Pt states her eyes stay dilated for a very long time  Referring physician: [name], DO [place],  [place] [address]  HISTORICAL INFORMATION:   Selected notes from the MEDICAL RECORD NUMBER Referred by Dr. Nancy Fetter -- ED f/u for traumatic hyphema following bungee cord trauma LEE:  Ocular Hx- PMH-    CURRENT MEDICATIONS: Current Outpatient Medications (Ophthalmic Drugs)  Medication Sig   prednisoLONE acetate (PRED FORTE) 1 % ophthalmic suspension Place 1 drop into the right eye 6 (six) times daily as directed.   brimonidine (ALPHAGAN) 0.15 % ophthalmic solution Place 1 drop into the right eye 3 (three) times daily.   dorzolamide-timolol (COSOPT) 22.3-6.8 MG/ML ophthalmic solution Place 1 drop into the right eye 3 (three) times daily.   No current facility-administered medications for this visit. (Ophthalmic Drugs)   Current Outpatient Medications (Other)  Medication Sig   acetaminophen (TYLENOL) 500 MG tablet Take 1 tablet (500 mg total) by mouth every 6 (six) hours as needed.   acetaZOLAMIDE ER (DIAMOX) 500 MG capsule Take 1 capsule (500 mg total) by mouth 2 (two) times daily.   ALPRAZolam (XANAX) 0.5 MG tablet Take 1 tablet by mouth 2 times daily as needed for anxiety (flight).  (Patient not taking: No sig reported)   aspirin EC 81 MG tablet Take 81 mg by mouth daily.   benzonatate (TESSALON PERLES) 100 MG capsule Take 1 capsule (100 mg total) by mouth 3 (three) times daily as needed for cough.   Blood Glucose Monitoring Suppl (FREESTYLE FREEDOM LITE) w/Device KIT Test blood sugar BID Dx E11.9   COVID-19 At Home Antigen Test (CARESTART COVID-19 HOME TEST) KIT use as directed.   furosemide (LASIX) 40 MG tablet TAKE 1 TABLET BY MOUTH DAILY.   glucose blood (FREESTYLE LITE) test strip Test blood sugar BID Dx E11.9   Lancets (FREESTYLE) lancets Test blood sugar BID Dx E11.9   metFORMIN (GLUCOPHAGE) 500 MG tablet TAKE 1 TABLET BY MOUTH 2 TIMES DAILY   multivitamin-iron-minerals-folic acid (CENTRUM) chewable tablet Chew 1 tablet by mouth daily.   omeprazole (PRILOSEC) 20 MG capsule Take 20 mg by mouth daily.   Pitavastatin Calcium (LIVALO) 2 MG TABS TAKE 1 TABLET BY MOUTH AT BEDTIME.   ramipril (ALTACE) 10 MG capsule TAKE 2 CAPSULES BY MOUTH ONCE A DAY   No current facility-administered medications for this visit. (Other)   REVIEW OF SYSTEMS: ROS   Positive for: Eyes Negative for: Constitutional, Gastrointestinal, Neurological, Skin, Genitourinary, Musculoskeletal, HENT, Endocrine, Cardiovascular, Respiratory, Psychiatric, Allergic/Imm, Heme/Lymph Last edited by Theodore Demark, COA on 08/05/2021  8:25 AM.      ALLERGIES  Allergies  Allergen Reactions   Erythromycin Other (See Comments)    Abdominal pain   Codeine Other (See Comments)    Hyper    Sulfa Antibiotics Hives   Tetracycline Rash   PAST MEDICAL HISTORY Past Medical History:  Diagnosis Date   Allergy    seasonal   Anxiety    Arthritis    Diabetes mellitus    type ii   GERD (gastroesophageal reflux disease)    Hiatal hernia    Hyperlipidemia    Hypertension    Irritable bowel syndrome    Obesity    Sinus infection 08/14/2017   Symptoms resolved after antiobiotic course   Tubular adenoma  of colon 05/27/2015   Ulcer    Past Surgical History:  Procedure Laterality Date   CHOLECYSTECTOMY  2009   COLONOSCOPY     POLYPECTOMY     FAMILY HISTORY Family History  Problem Relation Age of Onset   Diabetes Mother    Kidney disease Mother    Colitis Father    Colon polyps Father    Heart disease Father    Irritable bowel syndrome Father    Squamous cell carcinoma Father    Other Other        corkscrew esophagus- father, sister   Crohn's disease Sister    Colitis Sister    Colon polyps Sister    Ulcerative colitis Sister    Diabetes Maternal Aunt    Diabetes Paternal Aunt    Diabetes Maternal Grandmother    Diabetes Paternal Grandmother    Heart disease Paternal Grandmother    Colitis Paternal Grandfather    Colon cancer Neg Hx    Esophageal cancer Neg Hx    Pancreatic cancer Neg Hx    Rectal cancer Neg Hx    Stomach cancer Neg Hx    SOCIAL HISTORY Social History   Tobacco Use   Smoking status: Never   Smokeless tobacco: Never   Tobacco comments:    AS A Teenager  Vaping Use   Vaping Use: Never used  Substance Use Topics   Alcohol use: Yes    Alcohol/week: 0.0 standard drinks    Comment:  occasional wine   Drug use: No       OPHTHALMIC EXAM: Base Eye Exam     Visual Acuity (Snellen - Linear)       Right Left   Dist cc 20/40 -2 20/25 +2   Dist ph cc NI 20/20 -2    Correction: Glasses         Tonometry (Tonopen, 8:20 AM)       Right Left   Pressure 22 19         Pupils       Dark Light Shape React APD   Right 5 4 Round Minimal None   Left 4 3 Round Brisk None         Visual Fields (Counting fingers)       Left Right    Full Full         Extraocular Movement       Right Left    Full, Ortho Full, Ortho         Neuro/Psych     Oriented x3: Yes   Mood/Affect: Normal         Dilation     Both eyes: 1.0% Mydriacyl, 2.5% Phenylephrine @ 8:20 AM           Slit Lamp and Fundus Exam  Slit Lamp Exam        Right Left   Lids/Lashes Dermatochalasis - upper lid, Ecchymosis Dermatochalasis - upper lid   Conjunctiva/Sclera White and quiet temporal pinguecula   Cornea 1+PEE, trace micro cystic edema - improved, trace Descemet's folds, endo heme, mild decrease in TBUT Clear   Anterior Chamber Deep, 3-4+cell, pigment and RBC, <88m layered hyphema inferiorly Deep and quiet   Iris Round and dilated Round and reactive   Lens 2+ Nuclear sclerosis, 2+ Cortical cataract 2+ Nuclear sclerosis, 2+ Cortical cataract   Vitreous Vitreous syneresis, Posterior vitreous detachment          Fundus Exam       Right Left   Disc Pink and Sharp, Compact, tilted, mild temporal PPA    C/D Ratio 0.1    Macula Flat, Blunted foveal reflex, mild RPE mottling    Vessels mild attenuation, mild tortuousity    Periphery Attached               Refraction     Wearing Rx       Sphere Cylinder Axis Add   Right -7.00 +1.00 048 +1.75   Left -5.75 +0.00 180 +1.75           IMAGING AND PROCEDURES  Imaging and Procedures for 08/05/2021  OCT, Retina - OU - Both Eyes       Right Eye Quality was good. Central Foveal Thickness: 262. Progression has been stable. Findings include myopic contour, normal foveal contour, no IRF, no SRF.   Left Eye Quality was good. Central Foveal Thickness: 266. Progression has been stable. Findings include normal foveal contour, no IRF, no SRF, myopic contour.   Notes *Images captured and stored on drive  Diagnosis / Impression:  NFP, no IRF/SRF OU  Clinical management:  See below  Abbreviations: NFP - Normal foveal profile. CME - cystoid macular edema. PED - pigment epithelial detachment. IRF - intraretinal fluid. SRF - subretinal fluid. EZ - ellipsoid zone. ERM - epiretinal membrane. ORA - outer retinal atrophy. ORT - outer retinal tubulation. SRHM - subretinal hyper-reflective material. IRHM - intraretinal hyper-reflective material            ASSESSMENT/PLAN:     ICD-10-CM   1. Right eye injury, subsequent encounter  S05.91XD     2. Hyphema of right eye  H21.01     3. 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 Retinal edema  H35.81 OCT, Retina - OU - Both Eyes    5. Essential hypertension  I10     6. Hypertensive retinopathy of both eyes  H35.033     7. Combined forms of age-related cataract of both eyes  H25.813       1-3. Eye Injury w/ traumatic hyphema and ocular hypertension OD  - hit in eye with bungee cord while strapping something on a truck on 10.26.22  - [facility] consulted with Dr. SNancy Fetter - pt was initially started on brimonidine BID, timpotic BID and PF QID in the ED  - 10.27.22 -- presented here started on brim and cosopt TID OD + PF 6x/day  - BCVA stable at 20/40 OD, IOP slightly elevated at 22  - exam also improved: shows <149mlayered hyphema, 3-4+[facility]microcystic edema of cornea improved  - cont brim and Cosopt TID OD; PF 6x/day OD  - strict bedrest  - f/u Monday, Nov. 7 -  IOP / hyphema check,  DFE OD only  4. No retinal edema on exam or OCT   5,6. Hypertensive retinopathy OU - discussed importance of tight BP control - monitor  7. Mixed Cataract OU - The symptoms of cataract, surgical options, and treatments and risks were discussed with patient. - discussed diagnosis and progression - not yet visually significant - monitor for now  Ophthalmic Meds Ordered this visit:  Meds ordered this encounter  Medications   prednisoLONE acetate (PRED FORTE) 1 % ophthalmic suspension    Sig: Place 1 drop into the right eye 6 (six) times daily as directed.    Dispense:  15 mL    Refill:  0      Return in about 5 days (around 08/10/2021) for f/u eye injury OD, DFE OD only, OCT.  There are no Patient Instructions on file for this visit.  This document serves as a record of services personally performed by Gardiner Sleeper, MD, PhD. It was created on their behalf by San Jetty. Owens Shark,  OA an ophthalmic technician. The creation of this record is the provider's dictation and/or activities during the visit.    Electronically signed by: San Jetty. Owens Shark, [place] 11.02.2022 1:28 AM   Gardiner Sleeper, M.D., Ph.D. Diseases & Surgery of the Retina and Vitreous [facility]  I have reviewed the above documentation for accuracy and completeness, and I agree with the above. Gardiner Sleeper, M.D., Ph.D. 08/08/21 1:29 AM    Abbreviations: M myopia (nearsighted); A astigmatism; H hyperopia (farsighted); P presbyopia; Mrx spectacle prescription;  CTL contact lenses; OD right eye; OS left eye; OU both eyes  XT exotropia; ET esotropia; PEK punctate epithelial keratitis; PEE punctate epithelial erosions; DES dry eye syndrome; MGD meibomian gland dysfunction; ATs artificial tears; PFAT's preservative free artificial tears; [place] nuclear sclerotic cataract; PSC posterior subcapsular cataract; ERM epi-retinal membrane; PVD posterior vitreous detachment; RD retinal detachment; DM diabetes mellitus; DR diabetic retinopathy; NPDR non-proliferative diabetic retinopathy; PDR proliferative diabetic retinopathy; CSME clinically significant macular edema; DME diabetic macular edema; dbh dot blot hemorrhages; CWS cotton wool spot; POAG primary open angle glaucoma; C/D cup-to-disc ratio; HVF humphrey visual field; GVF goldmann visual field; OCT optical coherence tomography; IOP intraocular pressure; BRVO Branch retinal vein occlusion; CRVO central retinal vein occlusion; CRAO central retinal artery occlusion; BRAO branch retinal artery occlusion; RT retinal tear; SB scleral buckle; PPV pars plana vitrectomy; VH Vitreous hemorrhage; PRP panretinal laser photocoagulation; IVK intravitreal kenalog; VMT vitreomacular traction; MH Macular hole;  NVD neovascularization of the disc; NVE neovascularization elsewhere; AREDS age related eye disease study; ARMD age related macular degeneration; POAG primary  open angle glaucoma; EBMD epithelial/anterior basement membrane dystrophy; ACIOL anterior chamber intraocular lens; IOL intraocular lens; PCIOL posterior chamber intraocular lens; Phaco/IOL phacoemulsification with intraocular lens placement; [place] photorefractive keratectomy; LASIK laser assisted in situ keratomileusis; HTN hypertension; DM diabetes mellitus; COPD chronic obstructive pulmonary disease

## 2021-08-10 ENCOUNTER — Encounter (INDEPENDENT_AMBULATORY_CARE_PROVIDER_SITE_OTHER): Payer: Self-pay | Admitting: Ophthalmology

## 2021-08-10 ENCOUNTER — Other Ambulatory Visit: Payer: Self-pay

## 2021-08-10 ENCOUNTER — Ambulatory Visit (INDEPENDENT_AMBULATORY_CARE_PROVIDER_SITE_OTHER): Payer: No Typology Code available for payment source | Admitting: Ophthalmology

## 2021-08-10 DIAGNOSIS — I10 Essential (primary) hypertension: Secondary | ICD-10-CM | POA: Diagnosis not present

## 2021-08-10 DIAGNOSIS — H25813 Combined forms of age-related cataract, bilateral: Secondary | ICD-10-CM

## 2021-08-10 DIAGNOSIS — H2101 Hyphema, right eye: Secondary | ICD-10-CM

## 2021-08-10 DIAGNOSIS — S0591XD Unspecified injury of right eye and orbit, subsequent encounter: Secondary | ICD-10-CM

## 2021-08-10 DIAGNOSIS — H3581 Retinal edema: Secondary | ICD-10-CM

## 2021-08-10 DIAGNOSIS — S0590XA Unspecified injury of unspecified eye and orbit, initial encounter: Secondary | ICD-10-CM

## 2021-08-10 DIAGNOSIS — H35033 Hypertensive retinopathy, bilateral: Secondary | ICD-10-CM

## 2021-08-10 DIAGNOSIS — H40051 Ocular hypertension, right eye: Secondary | ICD-10-CM | POA: Diagnosis not present

## 2021-08-10 NOTE — Progress Notes (Signed)
[facility] Note  08/10/2021     CHIEF COMPLAINT Patient presents for Retina Follow Up   HISTORY OF PRESENT ILLNESS: [name] is a 60 y.o. female who presents to the clinic today for:   HPI     Retina Follow Up   Patient presents with  Other.  In right eye.  Severity is mild.  Duration of 5 days.  Since onset it is gradually improving.  I, the attending physician,  performed the HPI with the patient and updated documentation appropriately.        Comments   Pt here for 5 day ret f/u OD injury. Pt states she feels vision in OD has improved. Can see clearer. No ocular pain or discomfort.       Last edited by Bernarda Caffey, MD on 08/10/2021  8:26 AM.    Pt states she can tell that her vision is better, but not back to baseline yet, she is using PF 6x/day and brim and Cosopt TID  Referring physician: [name], DO [place],  [place] [address]  HISTORICAL INFORMATION:   Selected notes from the MEDICAL RECORD NUMBER Referred by Dr. Nancy Fetter -- ED f/u for traumatic hyphema following bungee cord trauma LEE:  Ocular Hx- PMH-    CURRENT MEDICATIONS: Current Outpatient Medications (Ophthalmic Drugs)  Medication Sig   brimonidine (ALPHAGAN) 0.15 % ophthalmic solution Place 1 drop into the right eye 3 (three) times daily.   dorzolamide-timolol (COSOPT) 22.3-6.8 MG/ML ophthalmic solution Place 1 drop into the right eye 3 (three) times daily.   prednisoLONE acetate (PRED FORTE) 1 % ophthalmic suspension Place 1 drop into the right eye 6 (six) times daily as directed.   No current facility-administered medications for this visit. (Ophthalmic Drugs)   Current Outpatient Medications (Other)  Medication Sig   acetaminophen (TYLENOL) 500 MG tablet Take 1 tablet (500 mg total) by mouth every 6 (six) hours as needed.   acetaZOLAMIDE ER (DIAMOX) 500 MG capsule Take 1 capsule (500 mg total) by mouth 2 (two) times daily.   ALPRAZolam (XANAX)  0.5 MG tablet Take 1 tablet by mouth 2 times daily as needed for anxiety (flight). (Patient not taking: No sig reported)   aspirin EC 81 MG tablet Take 81 mg by mouth daily.   benzonatate (TESSALON PERLES) 100 MG capsule Take 1 capsule (100 mg total) by mouth 3 (three) times daily as needed for cough.   Blood Glucose Monitoring Suppl (FREESTYLE FREEDOM LITE) w/Device KIT Test blood sugar BID Dx E11.9   COVID-19 At Home Antigen Test (CARESTART COVID-19 HOME TEST) KIT use as directed.   furosemide (LASIX) 40 MG tablet TAKE 1 TABLET BY MOUTH DAILY.   glucose blood (FREESTYLE LITE) test strip Test blood sugar BID Dx E11.9   Lancets (FREESTYLE) lancets Test blood sugar BID Dx E11.9   metFORMIN (GLUCOPHAGE) 500 MG tablet TAKE 1 TABLET BY MOUTH 2 TIMES DAILY   multivitamin-iron-minerals-folic acid (CENTRUM) chewable tablet Chew 1 tablet by mouth daily.   omeprazole (PRILOSEC) 20 MG capsule Take 20 mg by mouth daily.   Pitavastatin Calcium (LIVALO) 2 MG TABS TAKE 1 TABLET BY MOUTH AT BEDTIME.   ramipril (ALTACE) 10 MG capsule TAKE 2 CAPSULES BY MOUTH ONCE A DAY   No current facility-administered medications for this visit. (Other)   REVIEW OF SYSTEMS: ROS   Positive for: Eyes Negative for: Constitutional, Gastrointestinal, Neurological, Skin, Genitourinary, Musculoskeletal, HENT, Endocrine, Cardiovascular, Respiratory, Psychiatric, Allergic/Imm,  Heme/Lymph Last edited by Kingsley Spittle, COT on 08/10/2021  8:13 AM.      ALLERGIES Allergies  Allergen Reactions   Erythromycin Other (See Comments)    Abdominal pain   Codeine Other (See Comments)    Hyper    Sulfa Antibiotics Hives   Tetracycline Rash   PAST MEDICAL HISTORY Past Medical History:  Diagnosis Date   Allergy    seasonal   Anxiety    Arthritis    Diabetes mellitus    type ii   GERD (gastroesophageal reflux disease)    Hiatal hernia    Hyperlipidemia    Hypertension    Irritable bowel syndrome    Obesity    Sinus  infection 08/14/2017   Symptoms resolved after antiobiotic course   Tubular adenoma of colon 05/27/2015   Ulcer    Past Surgical History:  Procedure Laterality Date   CHOLECYSTECTOMY  2009   COLONOSCOPY     POLYPECTOMY     FAMILY HISTORY Family History  Problem Relation Age of Onset   Diabetes Mother    Kidney disease Mother    Colitis Father    Colon polyps Father    Heart disease Father    Irritable bowel syndrome Father    Squamous cell carcinoma Father    Other Other        corkscrew esophagus- father, sister   Crohn's disease Sister    Colitis Sister    Colon polyps Sister    Ulcerative colitis Sister    Diabetes Maternal Aunt    Diabetes Paternal Aunt    Diabetes Maternal Grandmother    Diabetes Paternal Grandmother    Heart disease Paternal Grandmother    Colitis Paternal Grandfather    Colon cancer Neg Hx    Esophageal cancer Neg Hx    Pancreatic cancer Neg Hx    Rectal cancer Neg Hx    Stomach cancer Neg Hx    SOCIAL HISTORY Social History   Tobacco Use   Smoking status: Never   Smokeless tobacco: Never   Tobacco comments:    AS A Teenager  Vaping Use   Vaping Use: Never used  Substance Use Topics   Alcohol use: Yes    Alcohol/week: 0.0 standard drinks    Comment:  occasional wine   Drug use: No       OPHTHALMIC EXAM: Base Eye Exam     Visual Acuity (Snellen - Linear)       Right Left   Dist cc 20/40 -1 def   Dist ph cc NI     Correction: Glasses         Tonometry (Tonopen, 8:24 AM)       Right Left   Pressure 14 def         Pupils       Pupils Dark Light Shape React APD   Right PERRL 5 4 Round Minimal None   Left PERRL 4 3 Round Minimal None         Visual Fields (Counting fingers)       Left Right    Full Full         Extraocular Movement       Right Left    Full, Ortho Full, Ortho         Neuro/Psych     Oriented x3: Yes   Mood/Affect: Normal         Dilation     Right eye: 1.0% Mydriacyl,  2.5% Phenylephrine @  8:25 AM           Slit Lamp and Fundus Exam     Slit Lamp Exam       Right Left   Lids/Lashes Dermatochalasis - upper lid, Ecchymosis Dermatochalasis - upper lid   Conjunctiva/Sclera White and quiet temporal pinguecula   Cornea 1-2+PEE, mild endo heme - improved, mild arcus Clear   Anterior Chamber Deep, 3+cell, pigment, layered hyphema resolved Deep and quiet   Iris Round and dilated Round and reactive   Lens 2+ Nuclear sclerosis, 2+ Cortical cataract 2+ Nuclear sclerosis, 2+ Cortical cataract   Vitreous Vitreous syneresis, Posterior vitreous detachment          Fundus Exam       Right Left   Disc Pink and Sharp, Compact, tilted, mild temporal PPA    C/D Ratio 0.1    Macula Flat, Blunted foveal reflex, mild RPE mottling    Vessels mild attenuation, mild tortuousity    Periphery Attached               Refraction     Wearing Rx       Sphere Cylinder Axis Add   Right -7.00 +1.00 048 +1.75   Left -5.75 +0.00 180 +1.75           IMAGING AND PROCEDURES  Imaging and Procedures for 08/10/2021  OCT, Retina - OU - Both Eyes       Right Eye Quality was good. Central Foveal Thickness: 262. Progression has been stable. Findings include myopic contour, normal foveal contour, no IRF, no SRF (Trace vitreous opacities).   Left Eye Quality was good. Central Foveal Thickness: 265. Progression has been stable. Findings include normal foveal contour, no IRF, no SRF, myopic contour, vitreomacular adhesion .   Notes *Images captured and stored on drive  Diagnosis / Impression:  NFP, no IRF/SRF OU  Clinical management:  See below  Abbreviations: NFP - Normal foveal profile. CME - cystoid macular edema. PED - pigment epithelial detachment. IRF - intraretinal fluid. SRF - subretinal fluid. EZ - ellipsoid zone. ERM - epiretinal membrane. ORA - outer retinal atrophy. ORT - outer retinal tubulation. SRHM - subretinal hyper-reflective material. IRHM -  intraretinal hyper-reflective material             ASSESSMENT/PLAN:    ICD-10-CM   1. Right eye injury, subsequent encounter  S05.91XD     2. Hyphema of right eye  H21.01     3. 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 Retinal edema  H35.81 OCT, Retina - OU - Both Eyes    5. Essential hypertension  I10     6. Hypertensive retinopathy of both eyes  H35.033     7. Combined forms of age-related cataract of both eyes  H25.813     8. Eye injury, initial encounter  S05.90XA      1-3. Eye Injury w/ traumatic hyphema and ocular hypertension OD -- improving  - hit in eye with bungee cord while strapping something on a truck on 10.26.22  - presented to [facility] ED on 10.26.22, consulted with Dr. Nancy Fetter  - pt was initially started on brimonidine BID, timpotic BID and PF QID in the ED  - 10.27.22 -- presented here started on brim and cosopt TID OD + PF 6x/day  - BCVA stable at 20/40 OD, IOP improved to 14  - exam also improved: layered hyphema resolved, 3+RBC in AC  - cont PF 6x/day -  cont brim TID OD - dec Cosopt to BID OD - f/u 1 week -  IOP / hyphema check, DFE OD only  4. No retinal edema on exam or OCT   5,6. Hypertensive retinopathy OU - discussed importance of tight BP control - monitor  7. Mixed Cataract OU - The symptoms of cataract, surgical options, and treatments and risks were discussed with patient. - discussed diagnosis and progression - not yet visually significant - monitor for now  Ophthalmic Meds Ordered this visit:  No orders of the defined types were placed in this encounter.     Return in about 1 week (around 08/17/2021) for f/u eye injury OD, DFE, OCT.  There are no Patient Instructions on file for this visit.  This document serves as a record of services personally performed by Gardiner Sleeper, MD, PhD. It was created on their behalf by San Jetty. Owens Shark, OA an ophthalmic technician. The creation of this record is the provider's  dictation and/or activities during the visit.    Electronically signed by: San Jetty. Owens Shark, [place] 11.07.2022 8:56 AM  Gardiner Sleeper, M.D., Ph.D. Diseases & Surgery of the Retina and Vitreous [facility]  I have reviewed the above documentation for accuracy and completeness, and I agree with the above. Gardiner Sleeper, M.D., Ph.D. 08/10/21 8:56 AM  Abbreviations: M myopia (nearsighted); A astigmatism; H hyperopia (farsighted); P presbyopia; Mrx spectacle prescription;  CTL contact lenses; OD right eye; OS left eye; OU both eyes  XT exotropia; ET esotropia; PEK punctate epithelial keratitis; PEE punctate epithelial erosions; DES dry eye syndrome; MGD meibomian gland dysfunction; ATs artificial tears; PFAT's preservative free artificial tears; [place] nuclear sclerotic cataract; PSC posterior subcapsular cataract; ERM epi-retinal membrane; PVD posterior vitreous detachment; RD retinal detachment; DM diabetes mellitus; DR diabetic retinopathy; NPDR non-proliferative diabetic retinopathy; PDR proliferative diabetic retinopathy; CSME clinically significant macular edema; DME diabetic macular edema; dbh dot blot hemorrhages; CWS cotton wool spot; POAG primary open angle glaucoma; C/D cup-to-disc ratio; HVF humphrey visual field; GVF goldmann visual field; OCT optical coherence tomography; IOP intraocular pressure; BRVO Branch retinal vein occlusion; CRVO central retinal vein occlusion; CRAO central retinal artery occlusion; BRAO branch retinal artery occlusion; RT retinal tear; SB scleral buckle; PPV pars plana vitrectomy; VH Vitreous hemorrhage; PRP panretinal laser photocoagulation; IVK intravitreal kenalog; VMT vitreomacular traction; MH Macular hole;  NVD neovascularization of the disc; NVE neovascularization elsewhere; AREDS age related eye disease study; ARMD age related macular degeneration; POAG primary open angle glaucoma; EBMD epithelial/anterior basement membrane dystrophy; ACIOL  anterior chamber intraocular lens; IOL intraocular lens; PCIOL posterior chamber intraocular lens; Phaco/IOL phacoemulsification with intraocular lens placement; [place] photorefractive keratectomy; LASIK laser assisted in situ keratomileusis; HTN hypertension; DM diabetes mellitus; COPD chronic obstructive pulmonary disease

## 2021-08-14 NOTE — Progress Notes (Signed)
[facility] Note  08/17/2021     CHIEF COMPLAINT Patient presents for Retina Follow Up   HISTORY OF PRESENT ILLNESS: [name] is a 60 y.o. female who presents to the clinic today for:   HPI     Retina Follow Up   Patient presents with  Other.  In right eye.  This started 1 week ago.  I, the attending physician,  performed the HPI with the patient and updated documentation appropriately.        Comments   Patient here for 1 weeks retina follow up for  injury OD. Patient states vision about the same. Not improved. Wore contact lens today. Not reading well with OD. No eye pain. Wanted only OD dilated. Took 2 days for dilation to wear off.       Last edited by Bernarda Caffey, MD on 08/17/2021  9:54 PM.     Pt states she is not seeing well up close with her right eye with her glasses or her CL  Referring physician: [name], DO [place],  [place] [address]  HISTORICAL INFORMATION:   Selected notes from the MEDICAL RECORD NUMBER Referred by Dr. Nancy Fetter -- ED f/u for traumatic hyphema following bungee cord trauma LEE:  Ocular Hx- PMH-    CURRENT MEDICATIONS: Current Outpatient Medications (Ophthalmic Drugs)  Medication Sig   brimonidine (ALPHAGAN) 0.15 % ophthalmic solution Place 1 drop into the right eye 3 (three) times daily.   dorzolamide-timolol (COSOPT) 22.3-6.8 MG/ML ophthalmic solution Place 1 drop into the right eye 3 (three) times daily.   prednisoLONE acetate (PRED FORTE) 1 % ophthalmic suspension Place 1 drop into the right eye 6 (six) times daily as directed.   No current facility-administered medications for this visit. (Ophthalmic Drugs)   Current Outpatient Medications (Other)  Medication Sig   ALPRAZolam (XANAX) 0.5 MG tablet Take 1 tablet by mouth 2 times daily as needed for anxiety (flight).   aspirin EC 81 MG tablet Take 81 mg by mouth daily.   Blood Glucose Monitoring Suppl (FREESTYLE FREEDOM LITE)  w/Device KIT Test blood sugar BID Dx E11.9   COVID-19 At Home Antigen Test (CARESTART COVID-19 HOME TEST) KIT use as directed.   furosemide (LASIX) 40 MG tablet TAKE 1 TABLET BY MOUTH DAILY.   glucose blood (FREESTYLE LITE) test strip Test blood sugar BID Dx E11.9   Lancets (FREESTYLE) lancets Test blood sugar BID Dx E11.9   metFORMIN (GLUCOPHAGE) 500 MG tablet TAKE 1 TABLET BY MOUTH 2 TIMES DAILY   multivitamin-iron-minerals-folic acid (CENTRUM) chewable tablet Chew 1 tablet by mouth daily.   omeprazole (PRILOSEC) 20 MG capsule Take 20 mg by mouth daily.   Pitavastatin Calcium (LIVALO) 2 MG TABS TAKE 1 TABLET BY MOUTH AT BEDTIME.   ramipril (ALTACE) 10 MG capsule TAKE 2 CAPSULES BY MOUTH ONCE A DAY   acetaminophen (TYLENOL) 500 MG tablet Take 1 tablet (500 mg total) by mouth every 6 (six) hours as needed. (Patient not taking: Reported on 08/17/2021)   acetaZOLAMIDE ER (DIAMOX) 500 MG capsule Take 1 capsule (500 mg total) by mouth 2 (two) times daily. (Patient not taking: Reported on 08/17/2021)   benzonatate (TESSALON PERLES) 100 MG capsule Take 1 capsule (100 mg total) by mouth 3 (three) times daily as needed for cough. (Patient not taking: Reported on 08/17/2021)   No current facility-administered medications for this visit. (Other)   REVIEW OF SYSTEMS: ROS   Positive for: Eyes  Negative for: Constitutional, Gastrointestinal, Neurological, Skin, Genitourinary, Musculoskeletal, HENT, Endocrine, Cardiovascular, Respiratory, Psychiatric, Allergic/Imm, Heme/Lymph Last edited by Theodore Demark, COA on 08/17/2021  8:29 AM.       ALLERGIES Allergies  Allergen Reactions   Erythromycin Other (See Comments)    Abdominal pain   Codeine Other (See Comments)    Hyper    Sulfa Antibiotics Hives   Tetracycline Rash   PAST MEDICAL HISTORY Past Medical History:  Diagnosis Date   Allergy    seasonal   Anxiety    Arthritis    Diabetes mellitus    type ii   GERD (gastroesophageal reflux  disease)    Hiatal hernia    Hyperlipidemia    Hypertension    Irritable bowel syndrome    Obesity    Sinus infection 08/14/2017   Symptoms resolved after antiobiotic course   Tubular adenoma of colon 05/27/2015   Ulcer    Past Surgical History:  Procedure Laterality Date   CHOLECYSTECTOMY  2009   COLONOSCOPY     POLYPECTOMY     FAMILY HISTORY Family History  Problem Relation Age of Onset   Diabetes Mother    Kidney disease Mother    Colitis Father    Colon polyps Father    Heart disease Father    Irritable bowel syndrome Father    Squamous cell carcinoma Father    Other Other        corkscrew esophagus- father, sister   Crohn's disease Sister    Colitis Sister    Colon polyps Sister    Ulcerative colitis Sister    Diabetes Maternal Aunt    Diabetes Paternal Aunt    Diabetes Maternal Grandmother    Diabetes Paternal Grandmother    Heart disease Paternal Grandmother    Colitis Paternal Grandfather    Colon cancer Neg Hx    Esophageal cancer Neg Hx    Pancreatic cancer Neg Hx    Rectal cancer Neg Hx    Stomach cancer Neg Hx    SOCIAL HISTORY Social History   Tobacco Use   Smoking status: Never   Smokeless tobacco: Never   Tobacco comments:    AS A Teenager  Vaping Use   Vaping Use: Never used  Substance Use Topics   Alcohol use: Yes    Alcohol/week: 0.0 standard drinks    Comment:  occasional wine   Drug use: No       OPHTHALMIC EXAM: Base Eye Exam     Visual Acuity (Snellen - Linear)       Right Left   Dist cc 20/40 -2 20/25   Dist ph cc 20/25     Correction: Contacts         Tonometry (Tonopen, 8:25 AM)       Right Left   Pressure 16 17         Pupils       Dark Light Shape React APD   Right 5 4 Round Minimal None   Left 4 3 Round Brisk None         Visual Fields (Counting fingers)       Left Right    Full Full         Extraocular Movement       Right Left    Full, Ortho Full, Ortho         Neuro/Psych      Oriented x3: Yes   Mood/Affect: Normal  Dilation     Right eye: 1.0% Mydriacyl, 2.5% Phenylephrine @ 8:24 AM           Slit Lamp and Fundus Exam     Slit Lamp Exam       Right Left   Lids/Lashes Dermatochalasis - upper lid, Ecchymosis Dermatochalasis - upper lid   Conjunctiva/Sclera White and quiet temporal pinguecula   Cornea 1+PEE, mild endo heme - improved, mild arcus Clear   Anterior Chamber Deep, 2-3+cell and fine pigment, layered hyphema resolved Deep and quiet   Iris Round and dilated Round and reactive   Lens 2+ Nuclear sclerosis, 2+ Cortical cataract 2+ Nuclear sclerosis, 2+ Cortical cataract   Vitreous Vitreous syneresis, Posterior vitreous detachment          Fundus Exam       Right Left   Disc Pink and Sharp, Compact, tilted, mild temporal PPA    C/D Ratio 0.1    Macula Flat, Blunted foveal reflex, mild RPE mottling    Vessels mild attenuation, mild tortuousity    Periphery Attached               Refraction     Wearing Rx       Sphere Cylinder Axis Add   Right -7.00 +1.00 048 +1.75   Left -5.75 +0.00 180 +1.75           IMAGING AND PROCEDURES  Imaging and Procedures for 08/17/2021  OCT, Retina - OU - Both Eyes       Right Eye Quality was good. Central Foveal Thickness: 260. Progression has been stable. Findings include myopic contour, normal foveal contour, no IRF, no SRF (Trace vitreous opacities - improved).   Left Eye Quality was good. Central Foveal Thickness: 262. Progression has been stable. Findings include normal foveal contour, no IRF, no SRF, myopic contour, vitreomacular adhesion .   Notes *Images captured and stored on drive  Diagnosis / Impression:  NFP, no IRF/SRF OU  Clinical management:  See below  Abbreviations: NFP - Normal foveal profile. CME - cystoid macular edema. PED - pigment epithelial detachment. IRF - intraretinal fluid. SRF - subretinal fluid. EZ - ellipsoid zone. ERM - epiretinal membrane.  ORA - outer retinal atrophy. ORT - outer retinal tubulation. SRHM - subretinal hyper-reflective material. IRHM - intraretinal hyper-reflective material            ASSESSMENT/PLAN:    ICD-10-CM   1. Right eye injury, subsequent encounter  S05.91XD     2. Hyphema of right eye  H21.01     3. 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 Retinal edema  H35.81 OCT, Retina - OU - Both Eyes    5. Essential hypertension  I10     6. Hypertensive retinopathy of both eyes  H35.033     7. Combined forms of age-related cataract of both eyes  H25.813      1-3. Eye Injury w/ traumatic hyphema and ocular hypertension OD -- improving  - hit in eye with bungee cord while strapping something on a truck on 10.26.22  - presented to [facility] ED on 10.26.22, consulted with Dr. Nancy Fetter  - pt was initially started on brimonidine BID, timpotic BID and PF QID in the ED  - 10.27.22 -- presented here started on brim and cosopt TID OD + PF 6x/day  - BCVA improved to 20/25 ([place]) from 20/40 OD, IOP improved to 16  - exam also improved: layered hyphema stably resolved, 2-3+RBC  in [facility]  - cont PF 6x/day - dec brim to BID OD - cont Cosopt BID OD - f/u 2 weeks -  IOP / hyphema check, DFE OD only  4. No retinal edema on exam or OCT   5,6. Hypertensive retinopathy OU - discussed importance of tight BP control - monitor  7. Mixed Cataract OU - The symptoms of cataract, surgical options, and treatments and risks were discussed with patient. - discussed diagnosis and progression - not yet visually significant - monitor for now  Ophthalmic Meds Ordered this visit:  No orders of the defined types were placed in this encounter.     Return in about 2 weeks (around 08/31/2021) for f/u eye injury OD, DFE, OCT.  There are no Patient Instructions on file for this visit.  This document serves as a record of services personally performed by Gardiner Sleeper, MD, PhD. It was created on their behalf by San Jetty.  Owens Shark, OA an ophthalmic technician. The creation of this record is the provider's dictation and/or activities during the visit.    Electronically signed by: San Jetty. Owens Shark, [place] 11.11.2022 9:54 PM   Gardiner Sleeper, M.D., Ph.D. Diseases & Surgery of the Retina and Vitreous [facility]  I have reviewed the above documentation for accuracy and completeness, and I agree with the above. Gardiner Sleeper, M.D., Ph.D. 08/17/21 9:55 PM   Abbreviations: M myopia (nearsighted); A astigmatism; H hyperopia (farsighted); P presbyopia; Mrx spectacle prescription;  CTL contact lenses; OD right eye; OS left eye; OU both eyes  XT exotropia; ET esotropia; PEK punctate epithelial keratitis; PEE punctate epithelial erosions; DES dry eye syndrome; MGD meibomian gland dysfunction; ATs artificial tears; PFAT's preservative free artificial tears; [place] nuclear sclerotic cataract; PSC posterior subcapsular cataract; ERM epi-retinal membrane; PVD posterior vitreous detachment; RD retinal detachment; DM diabetes mellitus; DR diabetic retinopathy; NPDR non-proliferative diabetic retinopathy; PDR proliferative diabetic retinopathy; CSME clinically significant macular edema; DME diabetic macular edema; dbh dot blot hemorrhages; CWS cotton wool spot; POAG primary open angle glaucoma; C/D cup-to-disc ratio; HVF humphrey visual field; GVF goldmann visual field; OCT optical coherence tomography; IOP intraocular pressure; BRVO Branch retinal vein occlusion; CRVO central retinal vein occlusion; CRAO central retinal artery occlusion; BRAO branch retinal artery occlusion; RT retinal tear; SB scleral buckle; PPV pars plana vitrectomy; VH Vitreous hemorrhage; PRP panretinal laser photocoagulation; IVK intravitreal kenalog; VMT vitreomacular traction; MH Macular hole;  NVD neovascularization of the disc; NVE neovascularization elsewhere; AREDS age related eye disease study; ARMD age related macular degeneration; POAG  primary open angle glaucoma; EBMD epithelial/anterior basement membrane dystrophy; ACIOL anterior chamber intraocular lens; IOL intraocular lens; PCIOL posterior chamber intraocular lens; Phaco/IOL phacoemulsification with intraocular lens placement; [place] photorefractive keratectomy; LASIK laser assisted in situ keratomileusis; HTN hypertension; DM diabetes mellitus; COPD chronic obstructive pulmonary disease

## 2021-08-17 ENCOUNTER — Encounter (INDEPENDENT_AMBULATORY_CARE_PROVIDER_SITE_OTHER): Payer: Self-pay | Admitting: Ophthalmology

## 2021-08-17 ENCOUNTER — Other Ambulatory Visit: Payer: Self-pay

## 2021-08-17 ENCOUNTER — Ambulatory Visit (INDEPENDENT_AMBULATORY_CARE_PROVIDER_SITE_OTHER): Payer: No Typology Code available for payment source | Admitting: Ophthalmology

## 2021-08-17 DIAGNOSIS — S0591XD Unspecified injury of right eye and orbit, subsequent encounter: Secondary | ICD-10-CM | POA: Diagnosis not present

## 2021-08-17 DIAGNOSIS — H35033 Hypertensive retinopathy, bilateral: Secondary | ICD-10-CM | POA: Diagnosis not present

## 2021-08-17 DIAGNOSIS — H40051 Ocular hypertension, right eye: Secondary | ICD-10-CM

## 2021-08-17 DIAGNOSIS — I10 Essential (primary) hypertension: Secondary | ICD-10-CM

## 2021-08-17 DIAGNOSIS — H3581 Retinal edema: Secondary | ICD-10-CM

## 2021-08-17 DIAGNOSIS — H2101 Hyphema, right eye: Secondary | ICD-10-CM | POA: Diagnosis not present

## 2021-08-17 DIAGNOSIS — H25813 Combined forms of age-related cataract, bilateral: Secondary | ICD-10-CM

## 2021-08-18 ENCOUNTER — Other Ambulatory Visit (INDEPENDENT_AMBULATORY_CARE_PROVIDER_SITE_OTHER): Payer: Self-pay

## 2021-08-18 ENCOUNTER — Other Ambulatory Visit (HOSPITAL_COMMUNITY): Payer: Self-pay

## 2021-08-18 MED ORDER — BRIMONIDINE TARTRATE 0.15 % OP SOLN
1.0000 [drp] | Freq: Two times a day (BID) | OPHTHALMIC | 1 refills | Status: DC
  Filled 2021-08-18: qty 5, 50d supply, fill #0

## 2021-08-19 ENCOUNTER — Other Ambulatory Visit (HOSPITAL_COMMUNITY): Payer: Self-pay

## 2021-08-25 NOTE — Progress Notes (Signed)
[facility] Note  08/31/2021     CHIEF COMPLAINT Patient presents for Retina Follow Up   HISTORY OF PRESENT ILLNESS: [name] is a 60 y.o. female who presents to the clinic today for:   HPI     Retina Follow Up   Patient presents with  Other.  In right eye.  This started 2 weeks ago.  I, the attending physician,  performed the HPI with the patient and updated documentation appropriately.        Comments   Patient here for 2 weeks retina follow up for injury OD. Patient states vision about the same. No major changes. Still can't see up close. Pupil hasn't changed. No eye pain. Sees better later in day than in the am.       Last edited by Bernarda Caffey, MD on 08/31/2021  4:44 PM.    Pt states she is not having any eye pain, she has been wearing daily CL and still using PF 6/day, she feels like vision is the same, maybe a little better  Referring physician: [name], DO [place],  [place] [address]  HISTORICAL INFORMATION:   Selected notes from the [place] Referred by Dr. Nancy Fetter -- ED f/u for traumatic hyphema following bungee cord trauma LEE:  Ocular Hx- PMH-    CURRENT MEDICATIONS: Current Outpatient Medications (Ophthalmic Drugs)  Medication Sig   brimonidine (ALPHAGAN) 0.15 % ophthalmic solution Place 1 drop into the right eye 2 (two) times daily.   dorzolamide-timolol (COSOPT) 22.3-6.8 MG/ML ophthalmic solution Place 1 drop into the right eye 3 (three) times daily.   prednisoLONE acetate (PRED FORTE) 1 % ophthalmic suspension Place 1 drop into the right eye 6 (six) times daily as directed.   No current facility-administered medications for this visit. (Ophthalmic Drugs)   Current Outpatient Medications (Other)  Medication Sig   ALPRAZolam (XANAX) 0.5 MG tablet Take 1 tablet by mouth 2 times daily as needed for anxiety (flight).   aspirin EC 81 MG tablet Take 81 mg by mouth daily.   Blood Glucose  Monitoring Suppl (FREESTYLE FREEDOM LITE) w/Device KIT Test blood sugar BID Dx E11.9   COVID-19 At Home Antigen Test (CARESTART COVID-19 HOME TEST) KIT use as directed.   furosemide (LASIX) 40 MG tablet TAKE 1 TABLET BY MOUTH DAILY.   glucose blood (FREESTYLE LITE) test strip Test blood sugar BID Dx E11.9   Lancets (FREESTYLE) lancets Test blood sugar BID Dx E11.9   metFORMIN (GLUCOPHAGE) 500 MG tablet TAKE 1 TABLET BY MOUTH 2 TIMES DAILY   multivitamin-iron-minerals-folic acid (CENTRUM) chewable tablet Chew 1 tablet by mouth daily.   omeprazole (PRILOSEC) 20 MG capsule Take 20 mg by mouth daily.   Pitavastatin Calcium (LIVALO) 2 MG TABS TAKE 1 TABLET BY MOUTH AT BEDTIME.   ramipril (ALTACE) 10 MG capsule TAKE 2 CAPSULES BY MOUTH ONCE A DAY   acetaminophen (TYLENOL) 500 MG tablet Take 1 tablet (500 mg total) by mouth every 6 (six) hours as needed. (Patient not taking: Reported on 08/17/2021)   acetaZOLAMIDE ER (DIAMOX) 500 MG capsule Take 1 capsule (500 mg total) by mouth 2 (two) times daily. (Patient not taking: Reported on 08/17/2021)   benzonatate (TESSALON PERLES) 100 MG capsule Take 1 capsule (100 mg total) by mouth 3 (three) times daily as needed for cough. (Patient not taking: Reported on 08/17/2021)   No current facility-administered medications for this visit. (Other)   REVIEW OF  SYSTEMS: ROS   Positive for: Eyes Negative for: Constitutional, Gastrointestinal, Neurological, Skin, Genitourinary, Musculoskeletal, HENT, Endocrine, Cardiovascular, Respiratory, Psychiatric, Allergic/Imm, Heme/Lymph Last edited by Theodore Demark, COA on 08/31/2021  8:24 AM.     ALLERGIES Allergies  Allergen Reactions   Erythromycin Other (See Comments)    Abdominal pain   Codeine Other (See Comments)    Hyper    Sulfa Antibiotics Hives   Tetracycline Rash   PAST MEDICAL HISTORY Past Medical History:  Diagnosis Date   Allergy    seasonal   Anxiety    Arthritis    Diabetes mellitus     type ii   GERD (gastroesophageal reflux disease)    Hiatal hernia    Hyperlipidemia    Hypertension    Irritable bowel syndrome    Obesity    Sinus infection 08/14/2017   Symptoms resolved after antiobiotic course   Tubular adenoma of colon 05/27/2015   Ulcer    Past Surgical History:  Procedure Laterality Date   CHOLECYSTECTOMY  2009   COLONOSCOPY     POLYPECTOMY     FAMILY HISTORY Family History  Problem Relation Age of Onset   Diabetes Mother    Kidney disease Mother    Colitis Father    Colon polyps Father    Heart disease Father    Irritable bowel syndrome Father    Squamous cell carcinoma Father    Other Other        corkscrew esophagus- father, sister   Crohn's disease Sister    Colitis Sister    Colon polyps Sister    Ulcerative colitis Sister    Diabetes Maternal Aunt    Diabetes Paternal Aunt    Diabetes Maternal Grandmother    Diabetes Paternal Grandmother    Heart disease Paternal Grandmother    Colitis Paternal Grandfather    Colon cancer Neg Hx    Esophageal cancer Neg Hx    Pancreatic cancer Neg Hx    Rectal cancer Neg Hx    Stomach cancer Neg Hx    SOCIAL HISTORY Social History   Tobacco Use   Smoking status: Never   Smokeless tobacco: Never   Tobacco comments:    AS A Teenager  Vaping Use   Vaping Use: Never used  Substance Use Topics   Alcohol use: Yes    Alcohol/week: 0.0 standard drinks    Comment:  occasional wine   Drug use: No       OPHTHALMIC EXAM: Base Eye Exam     Visual Acuity (Snellen - Linear)       Right Left   Dist cc 20/40 -1 20/30 -2   Dist ph cc 20/25 -1 20/20 -2    Correction: Contacts         Tonometry (Tonopen, 8:20 AM)       Right Left   Pressure 16 18         Pupils       Dark Light Shape React APD   Right 5 4 Round Minimal None   Left 4 3 Round Brisk None         Visual Fields (Counting fingers)       Left Right    Full Full         Extraocular Movement       Right Left     Full, Ortho Full, Ortho         Neuro/Psych     Oriented x3: Yes   Mood/Affect: Normal  Dilation     Right eye: 1.0% Mydriacyl, 2.5% Phenylephrine @ 8:20 AM           Slit Lamp and Fundus Exam     Slit Lamp Exam       Right Left   Lids/Lashes Dermatochalasis - upper lid, Ecchymosis Dermatochalasis - upper lid   Conjunctiva/Sclera White and quiet temporal pinguecula   Cornea Trace PEE, mild arcus Clear   Anterior Chamber Deep, 1-2+cell--mostly fine pigment, RBCs and layered hyphema resolved Deep and quiet   Iris Round and dilated Round and reactive   Lens 2+ Nuclear sclerosis, 2+ Cortical cataract 2+ Nuclear sclerosis, 2+ Cortical cataract   Anterior Vitreous Vitreous syneresis, Posterior vitreous detachment          Fundus Exam       Right Left   Disc Pink and Sharp, Compact, tilted, mild temporal PPA    C/D Ratio 0.1    Macula Flat, Blunted foveal reflex, mild RPE mottling    Vessels mild attenuation, mild tortuousity    Periphery Attached; no heme, RT/RD            Refraction     Wearing Rx       Sphere Cylinder Axis Add   Right -7.00 +1.00 048 +1.75   Left -5.75 +0.00 180 +1.75           IMAGING AND PROCEDURES  Imaging and Procedures for 08/31/2021  OCT, Retina - OU - Both Eyes       Right Eye Quality was good. Central Foveal Thickness: 262. Progression has been stable. Findings include myopic contour, normal foveal contour, no IRF, no SRF (Trace vitreous opacities - improved).   Left Eye Quality was good. Central Foveal Thickness: 266. Progression has been stable. Findings include normal foveal contour, no IRF, no SRF, myopic contour, vitreomacular adhesion .   Notes *Images captured and stored on drive  Diagnosis / Impression:  NFP, no IRF/SRF OU  Clinical management:  See below  Abbreviations: NFP - Normal foveal profile. CME - cystoid macular edema. PED - pigment epithelial detachment. IRF - intraretinal fluid. SRF -  subretinal fluid. EZ - ellipsoid zone. ERM - epiretinal membrane. ORA - outer retinal atrophy. ORT - outer retinal tubulation. SRHM - subretinal hyper-reflective material. IRHM - intraretinal hyper-reflective material            ASSESSMENT/PLAN:    ICD-10-CM   1. Right eye injury, subsequent encounter  S05.91XD     2. Hyphema of right eye  H21.01     3. 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 Retinal edema  H35.81 OCT, Retina - OU - Both Eyes    5. Essential hypertension  I10     6. Hypertensive retinopathy of both eyes  H35.033     7. Combined forms of age-related cataract of both eyes  H25.813       1-3. Eye Injury w/ traumatic hyphema and ocular hypertension OD -- improving  - hit in eye with bungee cord while strapping something on a truck on 10.26.22  - presented to [facility] ED on 10.26.22, consulted with Dr. Nancy Fetter  - pt was initially started on brimonidine BID, timpotic BID and PF QID in the ED  - 10.27.22 -- presented here started on brim and cosopt TID OD + PF 6x/day  - BCVA stably improved to 20/25 ([place]) from 20/40 OD, IOP improved to 16  - exam also improved: layered and micro-hyphema stably resolved,  2-3+cell (mostly pigment) in [facility] -- ?traumatic pigment dispersion picture  - recommend starting slow PF taper -- 4,3,2,1 drops daily, decrease every 2 weeks - dec brim BID OD to qdaily - dec Cosopt BID OD to qdaily - f/u 6 weeks -  IOP / AC check, DFE OD only  4. No retinal edema on exam or OCT  5,6. Hypertensive retinopathy OU - discussed importance of tight BP control - monitor  7. Mixed Cataract OU - The symptoms of cataract, surgical options, and treatments and risks were discussed with patient. - discussed diagnosis and progression - not yet visually significant - monitor for now  Ophthalmic Meds Ordered this visit:  No orders of the defined types were placed in this encounter.    Return in about 6 weeks (around 10/12/2021) for f/u eye injury  OS, DFE, OCT.  There are no Patient Instructions on file for this visit.  This document serves as a record of services personally performed by Gardiner Sleeper, MD, PhD. It was created on their behalf by Roselee Nova, COMT. The creation of this record is the provider's dictation and/or activities during the visit.  Electronically signed by: Roselee Nova, COMT 08/31/21 4:47 PM  Gardiner Sleeper, M.D., Ph.D. Diseases & Surgery of the Retina and Vitreous [facility]  I have reviewed the above documentation for accuracy and completeness, and I agree with the above. Gardiner Sleeper, M.D., Ph.D. 08/31/21 4:47 PM   Abbreviations: M myopia (nearsighted); A astigmatism; H hyperopia (farsighted); P presbyopia; Mrx spectacle prescription;  CTL contact lenses; OD right eye; OS left eye; OU both eyes  XT exotropia; ET esotropia; PEK punctate epithelial keratitis; PEE punctate epithelial erosions; DES dry eye syndrome; MGD meibomian gland dysfunction; ATs artificial tears; PFAT's preservative free artificial tears; [place] nuclear sclerotic cataract; PSC posterior subcapsular cataract; ERM epi-retinal membrane; PVD posterior vitreous detachment; RD retinal detachment; DM diabetes mellitus; DR diabetic retinopathy; NPDR non-proliferative diabetic retinopathy; PDR proliferative diabetic retinopathy; CSME clinically significant macular edema; DME diabetic macular edema; dbh dot blot hemorrhages; CWS cotton wool spot; POAG primary open angle glaucoma; C/D cup-to-disc ratio; HVF humphrey visual field; GVF goldmann visual field; OCT optical coherence tomography; IOP intraocular pressure; BRVO Branch retinal vein occlusion; CRVO central retinal vein occlusion; CRAO central retinal artery occlusion; BRAO branch retinal artery occlusion; RT retinal tear; SB scleral buckle; PPV pars plana vitrectomy; VH Vitreous hemorrhage; PRP panretinal laser photocoagulation; IVK intravitreal kenalog; VMT vitreomacular  traction; MH Macular hole;  NVD neovascularization of the disc; NVE neovascularization elsewhere; AREDS age related eye disease study; ARMD age related macular degeneration; POAG primary open angle glaucoma; EBMD epithelial/anterior basement membrane dystrophy; ACIOL anterior chamber intraocular lens; IOL intraocular lens; PCIOL posterior chamber intraocular lens; Phaco/IOL phacoemulsification with intraocular lens placement; [place] photorefractive keratectomy; LASIK laser assisted in situ keratomileusis; HTN hypertension; DM diabetes mellitus; COPD chronic obstructive pulmonary disease

## 2021-08-31 ENCOUNTER — Other Ambulatory Visit (HOSPITAL_COMMUNITY): Payer: Self-pay

## 2021-08-31 ENCOUNTER — Ambulatory Visit (INDEPENDENT_AMBULATORY_CARE_PROVIDER_SITE_OTHER): Payer: No Typology Code available for payment source | Admitting: Ophthalmology

## 2021-08-31 ENCOUNTER — Encounter (INDEPENDENT_AMBULATORY_CARE_PROVIDER_SITE_OTHER): Payer: Self-pay | Admitting: Ophthalmology

## 2021-08-31 ENCOUNTER — Other Ambulatory Visit: Payer: Self-pay

## 2021-08-31 DIAGNOSIS — H2101 Hyphema, right eye: Secondary | ICD-10-CM | POA: Diagnosis not present

## 2021-08-31 DIAGNOSIS — I10 Essential (primary) hypertension: Secondary | ICD-10-CM

## 2021-08-31 DIAGNOSIS — H40051 Ocular hypertension, right eye: Secondary | ICD-10-CM | POA: Diagnosis not present

## 2021-08-31 DIAGNOSIS — S0591XD Unspecified injury of right eye and orbit, subsequent encounter: Secondary | ICD-10-CM

## 2021-08-31 DIAGNOSIS — H35033 Hypertensive retinopathy, bilateral: Secondary | ICD-10-CM

## 2021-08-31 DIAGNOSIS — H3581 Retinal edema: Secondary | ICD-10-CM

## 2021-08-31 DIAGNOSIS — H25813 Combined forms of age-related cataract, bilateral: Secondary | ICD-10-CM

## 2021-09-07 ENCOUNTER — Encounter: Payer: Self-pay | Admitting: Nurse Practitioner

## 2021-09-07 ENCOUNTER — Ambulatory Visit (INDEPENDENT_AMBULATORY_CARE_PROVIDER_SITE_OTHER): Payer: No Typology Code available for payment source | Admitting: Nurse Practitioner

## 2021-09-07 DIAGNOSIS — R300 Dysuria: Secondary | ICD-10-CM | POA: Insufficient documentation

## 2021-09-07 LAB — URINALYSIS, ROUTINE W REFLEX MICROSCOPIC
Bilirubin, UA: NEGATIVE
Glucose, UA: NEGATIVE
Nitrite, UA: NEGATIVE
Protein,UA: NEGATIVE
Specific Gravity, UA: 1.015 (ref 1.005–1.030)
Urobilinogen, Ur: 0.2 mg/dL (ref 0.2–1.0)
pH, UA: 6.5 (ref 5.0–7.5)

## 2021-09-07 LAB — MICROSCOPIC EXAMINATION
Epithelial Cells (non renal): NONE SEEN /hpf (ref 0–10)
Renal Epithel, UA: NONE SEEN /hpf
WBC, UA: >30 /hpf — AB (ref 0–5)

## 2021-09-07 MED ORDER — PHENAZOPYRIDINE HCL 95 MG PO TABS: 95.0000 mg | ORAL_TABLET | Freq: Three times a day (TID) | ORAL | 0 refills | Status: DC | PRN

## 2021-09-07 MED ORDER — LEVOFLOXACIN 500 MG PO TABS: 500.0000 mg | ORAL_TABLET | Freq: Every day | ORAL | 0 refills | Status: DC

## 2021-09-07 NOTE — Progress Notes (Signed)
   Virtual Visit  Note Due to COVID-19 pandemic this visit was conducted virtually. This visit type was conducted due to national recommendations for restrictions regarding the COVID-19 Pandemic (e.g. social distancing, sheltering in place) in an effort to limit this patient's exposure and mitigate transmission in our community. All issues noted in this document were discussed and addressed.  A physical exam was not performed with this format.  I connected with [name] on 09/07/21 at 8:30 AM by telephone and verified that I am speaking with the correct person using two identifiers. [name] is currently located at work during visit. The provider, Ivy Lynn, NP is located in their office at time of visit.  I discussed the limitations, risks, security and privacy concerns of performing an evaluation and management service by telephone and the availability of in person appointments. I also discussed with the patient that there may be a patient responsible charge related to this service. The patient expressed understanding and agreed to proceed.   History and Present Illness:  HPI  [name] is a 60 y.o. female who complains of urinary frequency, urgency and dysuria x 2 days, without flank pain, fever, chills, or abnormal vaginal discharge or bleeding.      Review of Systems  HENT: Negative.    Respiratory: Negative.    Cardiovascular: Negative.   Gastrointestinal: Negative.   Genitourinary:  Positive for dysuria and urgency.  Skin: Negative.   All other systems reviewed and are negative.   Observations/Objective: Televisit patient not in distress  Assessment and Plan:  Dysuria  Appears well, in no apparent distress.  Vital signs are normal. The abdomen is soft without tenderness, guarding, mass, rebound or organomegaly. No CVA tenderness or inguinal adenopathy noted.  Urinalysis shows: Positive leukocytes, trace blood, ketones and few bacteria.  UTI uncomplicated  without evidence of pyelonephritis Levaquin 500 mg tablet by mouth daily  Treatment per orders - also push fluids, may use Pyridium OTC prn. Call or return to clinic prn if these symptoms worsen or fail to improve as anticipated.   Follow Up Instructions: Follow-up with unresolved symptoms    I discussed the assessment and treatment plan with the patient. The patient was provided an opportunity to ask questions and all were answered. The patient agreed with the plan and demonstrated an understanding of the instructions.   The patient was advised to call back or seek an in-person evaluation if the symptoms worsen or if the condition fails to improve as anticipated.  The above assessment and management plan was discussed with the patient. The patient verbalized understanding of and has agreed to the management plan. Patient is aware to call the clinic if symptoms persist or worsen. Patient is aware when to return to the clinic for a follow-up visit. Patient educated on when it is appropriate to go to the emergency department.   Time call ended: 8:38 AM  I provided 8 minutes of  non face-to-face time during this encounter.    Ivy Lynn, NP

## 2021-09-07 NOTE — Assessment & Plan Note (Addendum)
Appears well, in no apparent distress.  Vital signs are normal. The abdomen is soft without tenderness, guarding, mass, rebound or organomegaly. No CVA tenderness or inguinal adenopathy noted.  Urinalysis shows: Positive leukocytes, trace blood, ketones and few bacteria.  UTI uncomplicated without evidence of pyelonephritis Levaquin 500 mg tablet by mouth daily  Treatment per orders - also push fluids, may use Pyridium OTC prn. Call or return to clinic prn if these symptoms worsen or fail to improve as anticipated.

## 2021-09-12 LAB — CULTURE, URINE COMPREHENSIVE

## 2021-09-23 ENCOUNTER — Other Ambulatory Visit (HOSPITAL_COMMUNITY): Payer: Self-pay

## 2021-09-23 ENCOUNTER — Other Ambulatory Visit (INDEPENDENT_AMBULATORY_CARE_PROVIDER_SITE_OTHER): Payer: Self-pay

## 2021-09-23 MED ORDER — PREDNISOLONE ACETATE 1 % OP SUSP
1.0000 [drp] | Freq: Three times a day (TID) | OPHTHALMIC | 0 refills | Status: DC
  Filled 2021-09-23: qty 15, 90d supply, fill #0

## 2021-09-23 MED ORDER — DORZOLAMIDE HCL-TIMOLOL MAL 2-0.5 % OP SOLN
1.0000 [drp] | Freq: Every day | OPHTHALMIC | 1 refills | Status: DC
  Filled 2021-09-23: qty 10, 90d supply, fill #0

## 2021-09-24 ENCOUNTER — Other Ambulatory Visit (HOSPITAL_COMMUNITY): Payer: Self-pay

## 2021-10-07 NOTE — Progress Notes (Signed)
[facility] Note  10/12/2021     CHIEF COMPLAINT Patient presents for Retina Follow Up    HISTORY OF PRESENT ILLNESS: [name] is a 61 y.o. female who presents to the clinic today for:   HPI     Retina Follow Up   Patient presents with  Other.  In right eye.  Severity is moderate.  Duration of 6 weeks.  Since onset it is stable.  I, the attending physician,  performed the HPI with the patient and updated documentation appropriately.        Comments   Pt here for 6 wk ret f/u for OD injury. Pt reports vision seems to be about the same. She did go to see Dr. Einar Gip for AEE. Reports her IOP OD was 23 that day. She is still taking brim BID OD and cosopt BID OD, PF BID OD. Pt is meant to decrease to PF QD OD today for 1 wk.       Last edited by Bernarda Caffey, MD on 10/12/2021  1:13 PM.    Pt saw Dr. Einar Gip on December 28, she states her IOP that day was 23 with NCT, she is using brim and cosopt qd OD and PF tapers down to qd today as well  Referring physician: [name], DO [place],  [place] [address]  HISTORICAL INFORMATION:   Selected notes from the MEDICAL RECORD NUMBER Referred by Dr. Nancy Fetter -- ED f/u for traumatic hyphema following bungee cord trauma LEE:  Ocular Hx- PMH-    CURRENT MEDICATIONS: Current Outpatient Medications (Ophthalmic Drugs)  Medication Sig   brimonidine (ALPHAGAN) 0.15 % ophthalmic solution Place 1 drop into the right eye 2 (two) times daily.   dorzolamide-timolol (COSOPT) 22.3-6.8 MG/ML ophthalmic solution Place 1 drop into the right eye daily.   prednisoLONE acetate (PRED FORTE) 1 % ophthalmic suspension Place 1 drop into the right eye in the morning, at noon, and at bedtime.   No current facility-administered medications for this visit. (Ophthalmic Drugs)   Current Outpatient Medications (Other)  Medication Sig   ALPRAZolam (XANAX) 0.5 MG tablet Take 1 tablet by mouth 2 times daily as  needed for anxiety (flight).   aspirin EC 81 MG tablet Take 81 mg by mouth daily.   furosemide (LASIX) 40 MG tablet TAKE 1 TABLET BY MOUTH DAILY.   glucose blood (FREESTYLE LITE) test strip Test blood sugar BID Dx E11.9   Lancets (FREESTYLE) lancets Test blood sugar BID Dx E11.9   levofloxacin (LEVAQUIN) 500 MG tablet Take 1 tablet (500 mg total) by mouth daily.   metFORMIN (GLUCOPHAGE) 500 MG tablet TAKE 1 TABLET BY MOUTH 2 TIMES DAILY   multivitamin-iron-minerals-folic acid (CENTRUM) chewable tablet Chew 1 tablet by mouth daily.   omeprazole (PRILOSEC) 20 MG capsule Take 20 mg by mouth daily.   phenazopyridine (PYRIDIUM) 95 MG tablet Take 1 tablet (95 mg total) by mouth 3 (three) times daily as needed for pain.   Pitavastatin Calcium (LIVALO) 2 MG TABS TAKE 1 TABLET BY MOUTH AT BEDTIME.   ramipril (ALTACE) 10 MG capsule TAKE 2 CAPSULES BY MOUTH ONCE A DAY   acetaminophen (TYLENOL) 500 MG tablet Take 1 tablet (500 mg total) by mouth every 6 (six) hours as needed. (Patient not taking: Reported on 08/17/2021)   acetaZOLAMIDE ER (DIAMOX) 500 MG capsule Take 1 capsule (500 mg total) by mouth 2 (two) times daily. (Patient not taking: Reported on 08/17/2021)  benzonatate (TESSALON PERLES) 100 MG capsule Take 1 capsule (100 mg total) by mouth 3 (three) times daily as needed for cough. (Patient not taking: Reported on 08/17/2021)   Blood Glucose Monitoring Suppl (FREESTYLE FREEDOM LITE) w/Device KIT Test blood sugar BID Dx E11.9   COVID-19 At Home Antigen Test (CARESTART COVID-19 HOME TEST) KIT use as directed.   No current facility-administered medications for this visit. (Other)   REVIEW OF SYSTEMS: ROS   Positive for: Eyes Negative for: Constitutional, Gastrointestinal, Neurological, Skin, Genitourinary, Musculoskeletal, HENT, Endocrine, Cardiovascular, Respiratory, Psychiatric, Allergic/Imm, Heme/Lymph Last edited by Kingsley Spittle, COT on 10/12/2021  7:56 AM.      ALLERGIES Allergies   Allergen Reactions   Erythromycin Other (See Comments)    Abdominal pain   Codeine Other (See Comments)    Hyper    Sulfa Antibiotics Hives   Tetracycline Rash   PAST MEDICAL HISTORY Past Medical History:  Diagnosis Date   Allergy    seasonal   Anxiety    Arthritis    Diabetes mellitus    type ii   GERD (gastroesophageal reflux disease)    Hiatal hernia    Hyperlipidemia    Hypertension    Irritable bowel syndrome    Obesity    Sinus infection 08/14/2017   Symptoms resolved after antiobiotic course   Tubular adenoma of colon 05/27/2015   Ulcer    Past Surgical History:  Procedure Laterality Date   CHOLECYSTECTOMY  2009   COLONOSCOPY     POLYPECTOMY     FAMILY HISTORY Family History  Problem Relation Age of Onset   Diabetes Mother    Kidney disease Mother    Colitis Father    Colon polyps Father    Heart disease Father    Irritable bowel syndrome Father    Squamous cell carcinoma Father    Other Other        corkscrew esophagus- father, sister   Crohn's disease Sister    Colitis Sister    Colon polyps Sister    Ulcerative colitis Sister    Diabetes Maternal Aunt    Diabetes Paternal Aunt    Diabetes Maternal Grandmother    Diabetes Paternal Grandmother    Heart disease Paternal Grandmother    Colitis Paternal Grandfather    Colon cancer Neg Hx    Esophageal cancer Neg Hx    Pancreatic cancer Neg Hx    Rectal cancer Neg Hx    Stomach cancer Neg Hx    SOCIAL HISTORY Social History   Tobacco Use   Smoking status: Never   Smokeless tobacco: Never   Tobacco comments:    AS A Teenager  Vaping Use   Vaping Use: Never used  Substance Use Topics   Alcohol use: Yes    Alcohol/week: 0.0 standard drinks    Comment:  occasional wine   Drug use: No       OPHTHALMIC EXAM: Base Eye Exam     Visual Acuity (Snellen - Linear)       Right Left   Dist cc 20/40 +1 20/30 -2   Dist ph cc 20/25 -2 20/25    Correction: Contacts         Tonometry  (Tonopen, 8:05 AM)       Right Left   Pressure 12 15         Pupils       Dark Light Shape React APD   Right 5 4 Round Minimal None   Left 3  2 Round Brisk None         Visual Fields (Counting fingers)       Left Right    Full Full         Extraocular Movement       Right Left    Full, Ortho Full, Ortho         Neuro/Psych     Oriented x3: Yes   Mood/Affect: Normal         Dilation     Right eye: 1.0% Mydriacyl, 2.5% Phenylephrine @ 8:07 AM  Pt requested OD dilation only. MS          Slit Lamp and Fundus Exam     Slit Lamp Exam       Right Left   Lids/Lashes Dermatochalasis - upper lid, Ecchymosis Dermatochalasis - upper lid   Conjunctiva/Sclera White and quiet temporal pinguecula   Cornea Trace PEE, mild arcus trace PEE   Anterior Chamber Deep, 1-2+cell--mostly fine pigment, RBCs and layered hyphema resolved Deep and quiet   Iris Round and dilated Round and reactive   Lens 2+ Nuclear sclerosis, 2+ Cortical cataract 2+ Nuclear sclerosis, 2+ Cortical cataract   Anterior Vitreous Vitreous syneresis, Posterior vitreous detachment          Fundus Exam       Right Left   Disc Pink and Sharp, Compact, tilted, mild temporal PPA not dilated   C/D Ratio 0.3    Macula Flat, good foveal reflex, mild RPE mottling and clumping    Vessels mild attenuation, mild tortuousity    Periphery Attached; no heme, RT/RD            Refraction     Wearing Rx       Sphere Cylinder Axis Add   Right -7.00 +1.00 048 +1.75   Left -5.75 +0.00 180 +1.75           IMAGING AND PROCEDURES  Imaging and Procedures for 10/12/2021  OCT, Retina - OU - Both Eyes       Right Eye Quality was good. Central Foveal Thickness: 261. Progression has been stable. Findings include myopic contour, normal foveal contour, no IRF, no SRF (Trace vitreous opacities - improved).   Left Eye Quality was good. Central Foveal Thickness: 266. Progression has been stable. Findings  include normal foveal contour, no IRF, no SRF, myopic contour, vitreomacular adhesion .   Notes *Images captured and stored on drive  Diagnosis / Impression:  NFP, no IRF/SRF OU  Clinical management:  See below  Abbreviations: NFP - Normal foveal profile. CME - cystoid macular edema. PED - pigment epithelial detachment. IRF - intraretinal fluid. SRF - subretinal fluid. EZ - ellipsoid zone. ERM - epiretinal membrane. ORA - outer retinal atrophy. ORT - outer retinal tubulation. SRHM - subretinal hyper-reflective material. IRHM - intraretinal hyper-reflective material            ASSESSMENT/PLAN:    ICD-10-CM   1. Right eye injury, subsequent encounter  S05.91XD     2. Hyphema of right eye  H21.01     3. 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 Essential hypertension  I10     5. Hypertensive retinopathy of both eyes  H35.033 OCT, Retina - OU - Both Eyes    6. Combined forms of age-related cataract of both eyes  H25.813      1-3. Eye Injury w/ traumatic hyphema and ocular hypertension OD -- improving  - hit in eye  with bungee cord while strapping something on a truck on 10.26.22  - presented to [facility] ED on 10.26.22, consulted with Dr. Nancy Fetter  - pt was initially started on brimonidine BID, timpotic BID and PF QID in the ED  - 10.27.22 -- presented here started on brim and cosopt TID OD + PF 6x/day  - BCVA stably improved to 20/25 (PH) from 20/40 OD, IOP improved to 12  - exam also improved: layered and micro-hyphema stably resolved, 1-2+cell (mostly pigment) in [facility] -- ?traumatic pigment dispersion picture  - cont slow PF taper -- going down to 1 drops daily today for 2 wks then stop - dec brim BID OD to qdaily -- okay to stop - dec Cosopt BID OD to qdaily -- okay to stop - f/u 4-6 weeks -  IOP / AC check, DFE OD only  4,5. Hypertensive retinopathy OU - discussed importance of tight BP control - monitor  6. Mixed Cataract OU - The symptoms of cataract, surgical  options, and treatments and risks were discussed with patient. - discussed diagnosis and progression - not yet visually significant - monitor for now  Ophthalmic Meds Ordered this visit:  No orders of the defined types were placed in this encounter.     Return for f/u 4-6 weeks, eye injury OD, DFE, OCT.  There are no Patient Instructions on file for this visit.  This document serves as a record of services personally performed by Gardiner Sleeper, MD, PhD. It was created on their behalf by Roselee Nova, COMT. The creation of this record is the provider's dictation and/or activities during the visit.  Electronically signed by: Roselee Nova, COMT 10/12/21 1:18 PM  This document serves as a record of services personally performed by Gardiner Sleeper, MD, PhD. It was created on their behalf by San Jetty. Owens Shark, OA an ophthalmic technician. The creation of this record is the provider's dictation and/or activities during the visit.    Electronically signed by: San Jetty. Owens Shark, [place] 01.09.2022 1:18 PM  Gardiner Sleeper, M.D., Ph.D. Diseases & Surgery of the Retina and Vitreous [facility]  I have reviewed the above documentation for accuracy and completeness, and I agree with the above. Gardiner Sleeper, M.D., Ph.D. 10/12/21 1:18 PM  Abbreviations: M myopia (nearsighted); A astigmatism; H hyperopia (farsighted); P presbyopia; Mrx spectacle prescription;  CTL contact lenses; OD right eye; OS left eye; OU both eyes  XT exotropia; ET esotropia; PEK punctate epithelial keratitis; PEE punctate epithelial erosions; DES dry eye syndrome; MGD meibomian gland dysfunction; ATs artificial tears; PFAT's preservative free artificial tears; [place] nuclear sclerotic cataract; PSC posterior subcapsular cataract; ERM epi-retinal membrane; PVD posterior vitreous detachment; RD retinal detachment; DM diabetes mellitus; DR diabetic retinopathy; NPDR non-proliferative diabetic retinopathy; PDR  proliferative diabetic retinopathy; CSME clinically significant macular edema; DME diabetic macular edema; dbh dot blot hemorrhages; CWS cotton wool spot; POAG primary open angle glaucoma; C/D cup-to-disc ratio; HVF humphrey visual field; GVF goldmann visual field; OCT optical coherence tomography; IOP intraocular pressure; BRVO Branch retinal vein occlusion; CRVO central retinal vein occlusion; CRAO central retinal artery occlusion; BRAO branch retinal artery occlusion; RT retinal tear; SB scleral buckle; PPV pars plana vitrectomy; VH Vitreous hemorrhage; PRP panretinal laser photocoagulation; IVK intravitreal kenalog; VMT vitreomacular traction; MH Macular hole;  NVD neovascularization of the disc; NVE neovascularization elsewhere; AREDS age related eye disease study; ARMD age related macular degeneration; POAG primary open angle glaucoma; EBMD epithelial/anterior basement membrane dystrophy; ACIOL anterior  chamber intraocular lens; IOL intraocular lens; PCIOL posterior chamber intraocular lens; Phaco/IOL phacoemulsification with intraocular lens placement; [place] photorefractive keratectomy; LASIK laser assisted in situ keratomileusis; HTN hypertension; DM diabetes mellitus; COPD chronic obstructive pulmonary disease

## 2021-10-12 ENCOUNTER — Ambulatory Visit (INDEPENDENT_AMBULATORY_CARE_PROVIDER_SITE_OTHER): Payer: No Typology Code available for payment source | Admitting: Ophthalmology

## 2021-10-12 ENCOUNTER — Encounter (INDEPENDENT_AMBULATORY_CARE_PROVIDER_SITE_OTHER): Payer: Self-pay | Admitting: Ophthalmology

## 2021-10-12 ENCOUNTER — Other Ambulatory Visit: Payer: Self-pay

## 2021-10-12 DIAGNOSIS — H2101 Hyphema, right eye: Secondary | ICD-10-CM | POA: Diagnosis not present

## 2021-10-12 DIAGNOSIS — H40051 Ocular hypertension, right eye: Secondary | ICD-10-CM | POA: Diagnosis not present

## 2021-10-12 DIAGNOSIS — S0591XD Unspecified injury of right eye and orbit, subsequent encounter: Secondary | ICD-10-CM

## 2021-10-12 DIAGNOSIS — H35033 Hypertensive retinopathy, bilateral: Secondary | ICD-10-CM | POA: Diagnosis not present

## 2021-10-12 DIAGNOSIS — H25813 Combined forms of age-related cataract, bilateral: Secondary | ICD-10-CM

## 2021-10-12 DIAGNOSIS — I10 Essential (primary) hypertension: Secondary | ICD-10-CM

## 2021-10-20 ENCOUNTER — Other Ambulatory Visit (HOSPITAL_COMMUNITY): Payer: Self-pay

## 2021-11-12 NOTE — Progress Notes (Signed)
[facility] Note  11/16/2021     CHIEF COMPLAINT Patient presents for Retina Follow Up    HISTORY OF PRESENT ILLNESS: [name] is a 61 y.o. 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       Last edited by Bernarda Caffey, MD on 11/16/2021  8:18 AM.    Pt states she has been off all drops since a week after her last appt, she saw Dr. Newman Nip in December and will see him again in June  Referring physician: [name], [place],  [place] [address]  HISTORICAL INFORMATION:   Selected notes from the [facility] Referred by Dr. Nancy Fetter -- ED f/u for traumatic hyphema following bungee cord trauma LEE:  Ocular Hx- PMH-    CURRENT MEDICATIONS: Current Outpatient Medications (Ophthalmic Drugs)  Medication Sig   prednisoLONE acetate (PRED FORTE) 1 % ophthalmic suspension Place 1 drop into the right eye in the morning, at noon, and at bedtime.   brimonidine (ALPHAGAN) 0.15 % ophthalmic solution Place 1 drop into the right eye 2 (two) times daily. (Patient not taking: Reported on 11/16/2021)   dorzolamide-timolol (COSOPT) 22.3-6.8 MG/ML ophthalmic solution Place 1 drop into the right eye daily. (Patient not taking: Reported on 11/16/2021)   No current facility-administered medications for this visit. (Ophthalmic Drugs)   Current Outpatient Medications (Other)  Medication Sig   aspirin EC 81 MG tablet Take 81 mg by mouth daily.   Lancets (FREESTYLE) lancets Test blood sugar BID Dx E11.9   levofloxacin (LEVAQUIN) 500 MG tablet Take 1 tablet (500 mg total) by mouth daily.    metFORMIN (GLUCOPHAGE) 500 MG tablet TAKE 1 TABLET BY MOUTH 2 TIMES DAILY   multivitamin-iron-minerals-folic acid (CENTRUM) chewable tablet Chew 1 tablet by mouth daily.   omeprazole (PRILOSEC) 20 MG capsule Take 20 mg by mouth daily.   phenazopyridine (PYRIDIUM) 95 MG tablet Take 1 tablet (95 mg total) by mouth 3 (three) times daily as needed for pain.   Pitavastatin Calcium (LIVALO) 2 MG TABS TAKE 1 TABLET BY MOUTH AT BEDTIME.   ramipril (ALTACE) 10 MG capsule TAKE 2 CAPSULES BY MOUTH ONCE A DAY   acetaminophen (TYLENOL) 500 MG tablet Take 1 tablet (500 mg total) by mouth every 6 (six) hours as needed. (Patient not taking: Reported on 08/17/2021)   acetaZOLAMIDE ER (DIAMOX) 500 MG capsule Take 1 capsule (500 mg total) by mouth 2 (two) times daily. (Patient not taking: Reported on 08/17/2021)   ALPRAZolam (XANAX) 0.5 MG tablet Take 1 tablet by mouth 2 times daily as needed for anxiety (flight). (Patient not taking: Reported on 11/16/2021)   benzonatate (TESSALON PERLES) 100 MG capsule Take 1 capsule (100 mg total) by mouth 3 (three) times daily as needed for cough. (Patient not taking: Reported on 08/17/2021)   Blood Glucose Monitoring Suppl (FREESTYLE FREEDOM LITE) w/Device KIT Test blood sugar BID Dx E11.9 (Patient not taking: Reported on 11/16/2021)   COVID-19 At Home Antigen Test ([place]  TEST) KIT use as directed. (Patient not taking: Reported on 11/16/2021)   furosemide (LASIX) 40 MG tablet TAKE 1 TABLET BY MOUTH DAILY.   glucose blood (FREESTYLE LITE) test strip Test blood sugar BID Dx E11.9 (Patient not taking: Reported on 11/16/2021)   No current facility-administered medications for this visit. (Other)   REVIEW OF SYSTEMS: ROS   Positive for: Eyes Negative for: Constitutional, Gastrointestinal, Neurological, Skin, Genitourinary, Musculoskeletal, HENT, Endocrine, Cardiovascular, Respiratory, Psychiatric, Allergic/Imm, Heme/Lymph Last edited by Roselee Nova D, COT on  11/16/2021  7:56 AM.     ALLERGIES Allergies  Allergen Reactions   Erythromycin Other (See Comments)    Abdominal pain   Codeine Other (See Comments)    Hyper    Sulfa Antibiotics Hives   Tetracycline Rash   PAST MEDICAL HISTORY Past Medical History:  Diagnosis Date   Allergy    seasonal   Anxiety    Arthritis    Diabetes mellitus    type ii   GERD (gastroesophageal reflux disease)    Hiatal hernia    Hyperlipidemia    Hypertension    Irritable bowel syndrome    Obesity    Sinus infection 08/14/2017   Symptoms resolved after antiobiotic course   Tubular adenoma of colon 05/27/2015   Ulcer    Past Surgical History:  Procedure Laterality Date   CHOLECYSTECTOMY  2009   COLONOSCOPY     POLYPECTOMY     FAMILY HISTORY Family History  Problem Relation Age of Onset   Diabetes Mother    Kidney disease Mother    Colitis Father    Colon polyps Father    Heart disease Father    Irritable bowel syndrome Father    Squamous cell carcinoma Father    Other Other        corkscrew esophagus- father, sister   Crohn's disease Sister    Colitis Sister    Colon polyps Sister    Ulcerative colitis Sister    Diabetes Maternal Aunt    Diabetes Paternal Aunt    Diabetes Maternal Grandmother    Diabetes Paternal Grandmother    Heart disease Paternal Grandmother    Colitis Paternal Grandfather    Colon cancer Neg Hx    Esophageal cancer Neg Hx    Pancreatic cancer Neg Hx    Rectal cancer Neg Hx    Stomach cancer Neg Hx    SOCIAL HISTORY Social History   Tobacco Use   Smoking status: Never   Smokeless tobacco: Never   Tobacco comments:    AS A Teenager  Vaping Use   Vaping Use: Never used  Substance Use Topics   Alcohol use: Yes    Alcohol/week: 0.0 standard drinks    Comment:  occasional wine   Drug use: No       OPHTHALMIC EXAM: Base Eye Exam     Visual Acuity (Snellen - Linear)       Right Left   Dist cc 20/30 +1 20/25   Dist ph cc 20/20 -1 20/20     Correction: Glasses         Tonometry (Tonopen, 8:08 AM)       Right Left   Pressure 22 15         Pupils       Dark Light Shape React APD   Right 4 3.5 Round Slow None   Left 3 2 Round Brisk None         Visual Fields (Counting fingers)  Left Right    Full Full         Extraocular Movement       Right Left    Full, Ortho Full, Ortho         Neuro/Psych     Oriented x3: Yes   Mood/Affect: Normal         Dilation     Right eye: 1.0% Mydriacyl, 2.5% Phenylephrine @ 8:10 AM           IMAGING AND PROCEDURES  Imaging and Procedures for 11/16/2021          ASSESSMENT/PLAN:    ICD-10-CM   1. Right eye injury, subsequent encounter  S05.91XD OCT, Retina - OU - Both Eyes    2. Hyphema of right eye  H21.01     3. 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 Essential hypertension  I10     5. Hypertensive retinopathy of both eyes  H35.033     6. Combined forms of age-related cataract of both eyes  H25.813       1-3. Eye Injury w/ traumatic hyphema and ocular hypertension OD -- improving  - hit in eye with bungee cord while strapping something on a truck on 10.26.22  - presented to [facility] ED on 10.26.22, consulted with Dr. Nancy Fetter  - pt was initially started on brimonidine BID, timpotic BID and PF QID in the ED  - 10.27.22 -- presented here started on brim and cosopt TID OD + PF 6x/day  - BCVA stably improved to 20/20 (PH) from 20/25 OD, IOP is 22 today -- off all drops  - exam also improved: layered and micro-hyphema stably resolved, no cell or flare in [facility]  - pt is cleared from a retina standpoint for release to Dr. Newman Nip and resumption of primary eye care  4,5. Hypertensive retinopathy OU - discussed importance of tight BP control - monitor  6. Mixed Cataract OU - The symptoms of cataract, surgical options, and treatments and risks were discussed with patient. - discussed diagnosis and progression - not yet visually  significant - monitor for now  Ophthalmic Meds Ordered this visit:  No orders of the defined types were placed in this encounter.     No follow-ups on file.  There are no Patient Instructions on file for this visit.  This document serves as a record of services personally performed by Gardiner Sleeper, MD, PhD. It was created on their behalf by Roselee Nova, COMT. The creation of this record is the provider's dictation and/or activities during the visit.  Electronically signed by: Roselee Nova, COMT 11/16/21 8:27 AM  Gardiner Sleeper, M.D., Ph.D. Diseases & Surgery of the Retina and Vitreous [facility]  I have reviewed the above documentation for accuracy and completeness, and I agree with the above. Gardiner Sleeper, M.D., Ph.D. 11/16/21 8:41 AM   Abbreviations: M myopia (nearsighted); A astigmatism; H hyperopia (farsighted); P presbyopia; Mrx spectacle prescription;  CTL contact lenses; OD right eye; OS left eye; OU both eyes  XT exotropia; ET esotropia; PEK punctate epithelial keratitis; PEE punctate epithelial erosions; DES dry eye syndrome; MGD meibomian gland dysfunction; ATs artificial tears; PFAT's preservative free artificial tears; [place] nuclear sclerotic cataract; PSC posterior subcapsular cataract; ERM epi-retinal membrane; PVD posterior vitreous detachment; RD retinal detachment; DM diabetes mellitus; DR diabetic retinopathy; NPDR non-proliferative diabetic retinopathy; PDR proliferative diabetic retinopathy; CSME clinically significant macular edema; DME diabetic macular edema; dbh dot  blot hemorrhages; CWS cotton wool spot; POAG primary open angle glaucoma; C/D cup-to-disc ratio; HVF humphrey visual field; GVF goldmann visual field; OCT optical coherence tomography; IOP intraocular pressure; BRVO Branch retinal vein occlusion; CRVO central retinal vein occlusion; CRAO central retinal artery occlusion; BRAO branch retinal artery occlusion; RT retinal tear; SB  scleral buckle; PPV pars plana vitrectomy; VH Vitreous hemorrhage; PRP panretinal laser photocoagulation; IVK intravitreal kenalog; VMT vitreomacular traction; MH Macular hole;  NVD neovascularization of the disc; NVE neovascularization elsewhere; AREDS age related eye disease study; ARMD age related macular degeneration; POAG primary open angle glaucoma; EBMD epithelial/anterior basement membrane dystrophy; ACIOL anterior chamber intraocular lens; IOL intraocular lens; PCIOL posterior chamber intraocular lens; Phaco/IOL phacoemulsification with intraocular lens placement; [place] photorefractive keratectomy; LASIK laser assisted in situ keratomileusis; HTN hypertension; DM diabetes mellitus; COPD chronic obstructive pulmonary disease

## 2021-11-16 ENCOUNTER — Other Ambulatory Visit: Payer: Self-pay

## 2021-11-16 ENCOUNTER — Ambulatory Visit (INDEPENDENT_AMBULATORY_CARE_PROVIDER_SITE_OTHER): Payer: No Typology Code available for payment source | Admitting: Ophthalmology

## 2021-11-16 ENCOUNTER — Encounter (INDEPENDENT_AMBULATORY_CARE_PROVIDER_SITE_OTHER): Payer: Self-pay | Admitting: Ophthalmology

## 2021-11-16 DIAGNOSIS — H25813 Combined forms of age-related cataract, bilateral: Secondary | ICD-10-CM

## 2021-11-16 DIAGNOSIS — H2101 Hyphema, right eye: Secondary | ICD-10-CM

## 2021-11-16 DIAGNOSIS — H35033 Hypertensive retinopathy, bilateral: Secondary | ICD-10-CM | POA: Diagnosis not present

## 2021-11-16 DIAGNOSIS — H40051 Ocular hypertension, right eye: Secondary | ICD-10-CM

## 2021-11-16 DIAGNOSIS — I10 Essential (primary) hypertension: Secondary | ICD-10-CM | POA: Diagnosis not present

## 2021-11-16 DIAGNOSIS — S0591XD Unspecified injury of right eye and orbit, subsequent encounter: Secondary | ICD-10-CM

## 2021-12-03 ENCOUNTER — Other Ambulatory Visit (HOSPITAL_COMMUNITY): Payer: Self-pay

## 2021-12-03 ENCOUNTER — Other Ambulatory Visit: Payer: Self-pay | Admitting: Family Medicine

## 2021-12-03 MED ORDER — METFORMIN HCL 500 MG PO TABS
ORAL_TABLET | Freq: Two times a day (BID) | ORAL | 0 refills | Status: DC
  Filled 2021-12-03: qty 180, 90d supply, fill #0

## 2021-12-03 MED ORDER — FUROSEMIDE 40 MG PO TABS
ORAL_TABLET | Freq: Every day | ORAL | 0 refills | Status: DC
  Filled 2021-12-03: qty 90, 90d supply, fill #0

## 2021-12-03 MED ORDER — FREESTYLE LITE TEST VI STRP
ORAL_STRIP | 3 refills | Status: DC
  Filled 2021-12-03: qty 200, 90d supply, fill #0

## 2021-12-03 MED ORDER — RAMIPRIL 10 MG PO CAPS
ORAL_CAPSULE | Freq: Every day | ORAL | 0 refills | Status: DC
  Filled 2021-12-03: qty 180, 90d supply, fill #0

## 2021-12-04 ENCOUNTER — Other Ambulatory Visit (HOSPITAL_COMMUNITY): Payer: Self-pay

## 2021-12-14 ENCOUNTER — Other Ambulatory Visit: Payer: Self-pay | Admitting: Family Medicine

## 2021-12-14 DIAGNOSIS — E1169 Type 2 diabetes mellitus with other specified complication: Secondary | ICD-10-CM

## 2021-12-14 DIAGNOSIS — M8588 Other specified disorders of bone density and structure, other site: Secondary | ICD-10-CM

## 2021-12-14 DIAGNOSIS — I152 Hypertension secondary to endocrine disorders: Secondary | ICD-10-CM

## 2021-12-14 DIAGNOSIS — E785 Hyperlipidemia, unspecified: Secondary | ICD-10-CM

## 2021-12-14 DIAGNOSIS — Z130 Encounter for screening for diseases of the blood and blood-forming organs and certain disorders involving the immune mechanism: Secondary | ICD-10-CM

## 2021-12-14 DIAGNOSIS — E1159 Type 2 diabetes mellitus with other circulatory complications: Secondary | ICD-10-CM

## 2021-12-14 DIAGNOSIS — E119 Type 2 diabetes mellitus without complications: Secondary | ICD-10-CM

## 2021-12-14 DIAGNOSIS — E559 Vitamin D deficiency, unspecified: Secondary | ICD-10-CM

## 2021-12-15 ENCOUNTER — Encounter: Payer: Self-pay | Admitting: Family

## 2021-12-15 ENCOUNTER — Ambulatory Visit (INDEPENDENT_AMBULATORY_CARE_PROVIDER_SITE_OTHER): Payer: No Typology Code available for payment source | Admitting: Family

## 2021-12-15 VITALS — BP 133/70 | HR 117 | Temp 98.5°F | Ht 67.0 in | Wt 228.6 lb

## 2021-12-15 DIAGNOSIS — J209 Acute bronchitis, unspecified: Secondary | ICD-10-CM

## 2021-12-15 DIAGNOSIS — R509 Fever, unspecified: Secondary | ICD-10-CM

## 2021-12-15 LAB — VERITOR FLU A/B WAIVED
Influenza A: NEGATIVE
Influenza B: NEGATIVE

## 2021-12-15 MED ORDER — AMOXICILLIN-POT CLAVULANATE 875-125 MG PO TABS: 1.0000 | ORAL_TABLET | Freq: Two times a day (BID) | ORAL | 0 refills | Status: DC

## 2021-12-15 MED ORDER — PREDNISONE 20 MG PO TABS
40.0000 mg | ORAL_TABLET | Freq: Every day | ORAL | 0 refills | Status: AC
Start: 2021-12-15 — End: 2021-12-20

## 2021-12-15 MED ORDER — METHYLPREDNISOLONE ACETATE 80 MG/ML IJ SUSP
80.0000 mg | Freq: Once | INTRAMUSCULAR | Status: AC
  Administered 2021-12-15: 80 mg via INTRAMUSCULAR

## 2021-12-15 NOTE — Patient Instructions (Signed)
Acute Bronchitis, Adult °Acute bronchitis is sudden inflammation of the main airways (bronchi) that come off the windpipe (trachea) in the lungs. The swelling causes the airways to get smaller and make more mucus than normal. This can make it hard to breathe and can cause coughing or noisy breathing (wheezing). °Acute bronchitis may last several weeks. The cough may last longer. Allergies, asthma, and exposure to smoke may make the condition worse. °What are the causes? °This condition can be caused by germs and by substances that irritate the lungs, including: °Cold and flu viruses. The most common cause of this condition is the virus that causes the common cold. °Bacteria. This is less common. °Breathing in substances that irritate the lungs, including: °Smoke from cigarettes and other forms of tobacco. °Dust and pollen. °Fumes from household cleaning products, gases, or burned fuel. °Indoor or outdoor air pollution. °What increases the risk? °The following factors may make you more likely to develop this condition: °A weak body's defense system, also called the immune system. °A condition that affects your lungs and breathing, such as asthma. °What are the signs or symptoms? °Common symptoms of this condition include: °Coughing. This may bring up clear, yellow, or green mucus from your lungs (sputum). °Wheezing. °Runny or stuffy nose. °Having too much mucus in your lungs (chest congestion). °Shortness of breath. °Aches and pains, including sore throat or chest. °How is this diagnosed? °This condition is usually diagnosed based on: °Your symptoms and medical history. °A physical exam. °You may also have other tests, including tests to rule out other conditions, such as pneumonia. These tests include: °A test of lung function. °Test of a mucus sample to look for the presence of bacteria. °Tests to check the oxygen level in your blood. °Blood tests. °Chest X-ray. °How is this treated? °Most cases of acute bronchitis  clear up over time without treatment. Your health care provider may recommend: °Drinking more fluids to help thin your mucus so it is easier to cough up. °Taking inhaled medicine (inhaler) to improve air flow in and out of your lungs. °Using a vaporizer or a humidifier. These are machines that add water to the air to help you breathe better. °Taking a medicine that thins mucus and clears congestion (expectorant). °Taking a medicine that prevents or stops coughing (cough suppressant). °It is notcommon to take an antibiotic medicine for this condition. °Follow these instructions at home: ° °Take over-the-counter and prescription medicines only as told by your health care provider. °Use an inhaler, vaporizer, or humidifier as told by your health care provider. °Take two teaspoons (10 mL) of honey at bedtime to lessen coughing at night. °Drink enough fluid to keep your urine pale yellow. °Do not use any products that contain nicotine or tobacco. These products include cigarettes, chewing tobacco, and vaping devices, such as e-cigarettes. If you need help quitting, ask your health care provider. °Get plenty of rest. °Return to your normal activities as told by your health care provider. Ask your health care provider what activities are safe for you. °Keep all follow-up visits. This is important. °How is this prevented? °To lower your risk of getting this condition again: °Wash your hands often with soap and water for at least 20 seconds. If soap and water are not available, use hand sanitizer. °Avoid contact with people who have cold symptoms. °Try not to touch your mouth, nose, or eyes with your hands. °Avoid breathing in smoke or chemical fumes. Breathing smoke or chemical fumes will make your condition   worse. °Get the flu shot every year. °Contact a health care provider if: °Your symptoms do not improve after 2 weeks. °You have trouble coughing up the mucus. °Your cough keeps you awake at night. °You have a  fever. °Get help right away if you: °Cough up blood. °Feel pain in your chest. °Have severe shortness of breath. °Faint or keep feeling like you are going to faint. °Have a severe headache. °Have a fever or chills that get worse. °These symptoms may represent a serious problem that is an emergency. Do not wait to see if the symptoms will go away. Get medical help right away. Call your local emergency services (911 in the [place] Do not drive yourself to the hospital. °Summary °Acute bronchitis is inflammation of the main airways (bronchi) that come off the windpipe (trachea) in the lungs. The swelling causes the airways to get smaller and make more mucus than normal. °Drinking more fluids can help thin your mucus so it is easier to cough up. °Take over-the-counter and prescription medicines only as told by your health care provider. °Do not use any products that contain nicotine or tobacco. These products include cigarettes, chewing tobacco, and vaping devices, such as e-cigarettes. If you need help quitting, ask your health care provider. °Contact a health care provider if your symptoms do not improve after 2 weeks. °This information is not intended to replace advice given to you by your health care provider. Make sure you discuss any questions you have with your health care provider. °Document Revised: 01/21/2021 Document Reviewed: 01/21/2021 °Elsevier Patient Education © 2022 [employer] ° °

## 2021-12-15 NOTE — Progress Notes (Signed)
? ?Subjective:  ? ? Patient ID: [name], female    DOB: 07/27/1961, 61 y.o.   MRN: [id number] ? ?Chief Complaint  ?Patient presents with  ? Cough  ? Chills  ? Fever  ? Nausea  ? ?PT presents to the office today with cough that started yesterday. She took a home COVID test yesterday that was negative X2.  ?Cough ?This is a new problem. The current episode started yesterday. The problem has been gradually worsening. The problem occurs every few minutes. The cough is Productive of purulent sputum. Associated symptoms include chest pain, chills, a fever, headaches, myalgias, nasal congestion, postnasal drip, rhinorrhea and wheezing. Pertinent negatives include no ear congestion, ear pain or shortness of breath. She has tried rest for the symptoms. The treatment provided mild relief. There is no history of asthma.  ?Fever  ?Associated symptoms include chest pain, coughing, headaches and wheezing. Pertinent negatives include no ear pain.  ? ? ? ?Review of Systems  ?Constitutional:  Positive for chills and fever.  ?HENT:  Positive for postnasal drip and rhinorrhea. Negative for ear pain.   ?Respiratory:  Positive for cough and wheezing. Negative for shortness of breath.   ?Cardiovascular:  Positive for chest pain.  ?Musculoskeletal:  Positive for myalgias.  ?Neurological:  Positive for headaches.  ?All other systems reviewed and are negative. ? ?   ?Objective:  ? Physical Exam ?Vitals reviewed.  ?Constitutional:   ?   General: She is not in acute distress. ?   Appearance: She is well-developed. She is obese.  ?HENT:  ?   Head: Normocephalic and atraumatic.  ?   Right Ear: Tympanic membrane normal.  ?   Left Ear: Tympanic membrane normal.  ?Eyes:  ?   Pupils: Pupils are equal, round, and reactive to light.  ?Neck:  ?   Thyroid: No thyromegaly.  ?Cardiovascular:  ?   Rate and Rhythm: Normal rate and regular rhythm.  ?   Heart sounds: Normal heart sounds. No murmur heard. ?Pulmonary:  ?   Effort: Pulmonary effort is  normal. No respiratory distress.  ?   Breath sounds: Normal breath sounds. No wheezing.  ?   Comments: Barking cough ?Abdominal:  ?   General: Bowel sounds are normal. There is no distension.  ?   Palpations: Abdomen is soft.  ?   Tenderness: There is no abdominal tenderness.  ?Musculoskeletal:     ?   General: No tenderness. Normal range of motion.  ?   Cervical back: Normal range of motion and neck supple.  ?Skin: ?   General: Skin is warm and dry.  ?Neurological:  ?   Mental Status: She is alert and oriented to person, place, and time.  ?   Cranial Nerves: No cranial nerve deficit.  ?   Deep Tendon Reflexes: Reflexes are normal and symmetric.  ?Psychiatric:     ?   Behavior: Behavior normal.     ?   Thought Content: Thought content normal.     ?   Judgment: Judgment normal.  ? ? ?BP 133/70   Pulse (!) 117   Temp 98.5 ?F (36.9 ?C) (Temporal)   Ht '5\' 7"'$  (1.702 m)   Wt 228 lb 9.6 oz (103.7 kg)   LMP 12/26/2014 (Approximate) Comment: 2-3 days  SpO2 95%   BMI 35.80 kg/m?  ?   ?Assessment & Plan:  ?[name] comes in today with chief complaint of Cough, Chills, Fever, and Nausea ? ? ?Diagnosis and orders  addressed: ? ?1. Fever, unspecified fever cause ?- Veritor Flu A/B Waived ? ?2. Acute bronchitis, unspecified organism ?Strict low carb diet  ?Start prednisone tomorrow ?Wait a 2-3 days to start Augmentin if symptoms worsen or do not improve  ?- Take meds as prescribed ?- Use a cool mist humidifier  ?-Use saline nose sprays frequently ?-Force fluids ?-For any cough or congestion ? Use plain Mucinex- regular strength or max strength is fine ?-For fever or aces or pains- take tylenol or ibuprofen. ?-Throat lozenges if help ?-Follow up if symptoms worsen or do not improve  ?- methylPREDNISolone acetate (DEPO-MEDROL) injection 80 mg ?- predniSONE (DELTASONE) 20 MG tablet; Take 2 tablets (40 mg total) by mouth daily with breakfast for 5 days.  Dispense: 10 tablet; Refill: 0 ?- amoxicillin-clavulanate (AUGMENTIN)  875-125 MG tablet; Take 1 tablet by mouth 2 (two) times daily.  Dispense: 14 tablet; Refill: 0 ? ? ?Evelina Dun, FNP ? ? ?

## 2021-12-17 ENCOUNTER — Other Ambulatory Visit: Payer: No Typology Code available for payment source

## 2021-12-17 LAB — BAYER DCA HB A1C WAIVED: HB A1C (BAYER DCA - WAIVED): 6.4 % — ABNORMAL HIGH (ref 4.8–5.6)

## 2021-12-18 ENCOUNTER — Ambulatory Visit (INDEPENDENT_AMBULATORY_CARE_PROVIDER_SITE_OTHER): Payer: No Typology Code available for payment source | Admitting: Family Medicine

## 2021-12-18 ENCOUNTER — Encounter: Payer: Self-pay | Admitting: Family Medicine

## 2021-12-18 ENCOUNTER — Other Ambulatory Visit (HOSPITAL_COMMUNITY): Payer: Self-pay

## 2021-12-18 VITALS — BP 99/58 | HR 76 | Temp 98.4°F | Ht 67.0 in | Wt 222.2 lb

## 2021-12-18 DIAGNOSIS — L820 Inflamed seborrheic keratosis: Secondary | ICD-10-CM | POA: Diagnosis not present

## 2021-12-18 DIAGNOSIS — E1169 Type 2 diabetes mellitus with other specified complication: Secondary | ICD-10-CM

## 2021-12-18 DIAGNOSIS — E119 Type 2 diabetes mellitus without complications: Secondary | ICD-10-CM | POA: Diagnosis not present

## 2021-12-18 DIAGNOSIS — E1159 Type 2 diabetes mellitus with other circulatory complications: Secondary | ICD-10-CM

## 2021-12-18 DIAGNOSIS — I152 Hypertension secondary to endocrine disorders: Secondary | ICD-10-CM

## 2021-12-18 DIAGNOSIS — E785 Hyperlipidemia, unspecified: Secondary | ICD-10-CM

## 2021-12-18 LAB — CBC
Hematocrit: 39.7 % (ref 34.0–46.6)
Hemoglobin: 13.3 g/dL (ref 11.1–15.9)
MCH: 28.7 pg (ref 26.6–33.0)
MCHC: 33.5 g/dL (ref 31.5–35.7)
MCV: 86 fL (ref 79–97)
Platelets: 257 10*3/uL (ref 150–450)
RBC: 4.63 x10E6/uL (ref 3.77–5.28)
RDW: 12.4 % (ref 11.7–15.4)
WBC: 5 10*3/uL (ref 3.4–10.8)

## 2021-12-18 LAB — TSH: TSH: 0.843 u[IU]/mL (ref 0.450–4.500)

## 2021-12-18 LAB — CMP14+EGFR
ALT: 30 IU/L (ref 0–32)
AST: 27 IU/L (ref 0–40)
Albumin/Globulin Ratio: 1.9 (ref 1.2–2.2)
Albumin: 4.6 g/dL (ref 3.8–4.9)
Alkaline Phosphatase: 65 IU/L (ref 44–121)
BUN/Creatinine Ratio: 9 (ref 9–23)
BUN: 9 mg/dL (ref 6–24)
Bilirubin Total: 0.5 mg/dL (ref 0.0–1.2)
CO2: 27 mmol/L (ref 20–29)
Calcium: 9.7 mg/dL (ref 8.7–10.2)
Chloride: 93 mmol/L — ABNORMAL LOW (ref 96–106)
Creatinine, Ser: 1.01 mg/dL — ABNORMAL HIGH (ref 0.57–1.00)
Globulin, Total: 2.4 g/dL (ref 1.5–4.5)
Glucose: 135 mg/dL — ABNORMAL HIGH (ref 70–99)
Potassium: 3.8 mmol/L (ref 3.5–5.2)
Sodium: 136 mmol/L (ref 134–144)
Total Protein: 7 g/dL (ref 6.0–8.5)
eGFR: 64 mL/min/{1.73_m2} (ref >59)

## 2021-12-18 LAB — VITAMIN D 25 HYDROXY (VIT D DEFICIENCY, FRACTURES): Vit D, 25-Hydroxy: 35 ng/mL (ref 30.0–100.0)

## 2021-12-18 LAB — LIPID PANEL
Chol/HDL Ratio: 2.8 ratio (ref 0.0–4.4)
Cholesterol, Total: 113 mg/dL (ref 100–199)
HDL: 40 mg/dL (ref >39)
LDL Chol Calc (NIH): 53 mg/dL (ref 0–99)
Triglycerides: 105 mg/dL (ref 0–149)
VLDL Cholesterol Cal: 20 mg/dL (ref 5–40)

## 2021-12-18 MED ORDER — LIVALO 2 MG PO TABS
1.0000 | ORAL_TABLET | Freq: Every day | ORAL | 3 refills | Status: DC
  Filled 2021-12-18 – 2022-02-12 (×2): qty 90, 90d supply, fill #0
  Filled 2022-05-13: qty 90, 90d supply, fill #1
  Filled 2022-08-13: qty 90, 90d supply, fill #2
  Filled 2022-11-12: qty 90, 90d supply, fill #3

## 2021-12-18 MED ORDER — METFORMIN HCL 500 MG PO TABS
ORAL_TABLET | Freq: Two times a day (BID) | ORAL | 3 refills | Status: DC
  Filled 2021-12-18: qty 180, fill #0
  Filled 2022-03-09: qty 180, 90d supply, fill #0
  Filled 2022-05-27: qty 180, 90d supply, fill #1
  Filled 2022-08-31: qty 180, 90d supply, fill #2
  Filled 2022-11-29: qty 180, 90d supply, fill #3

## 2021-12-18 MED ORDER — FUROSEMIDE 40 MG PO TABS
ORAL_TABLET | Freq: Every day | ORAL | 3 refills | Status: DC
  Filled 2021-12-18: qty 90, fill #0
  Filled 2022-04-19: qty 90, 90d supply, fill #0
  Filled 2022-07-12: qty 90, 90d supply, fill #1
  Filled 2022-10-22: qty 90, 90d supply, fill #2

## 2021-12-18 MED ORDER — RAMIPRIL 10 MG PO CAPS
10.0000 mg | ORAL_CAPSULE | Freq: Every day | ORAL | 3 refills | Status: DC
  Filled 2021-12-18 – 2022-03-09 (×2): qty 90, 90d supply, fill #0
  Filled 2022-05-27: qty 90, 90d supply, fill #1
  Filled 2022-08-31: qty 90, 90d supply, fill #2
  Filled 2022-11-29: qty 90, 90d supply, fill #3

## 2021-12-18 NOTE — Progress Notes (Signed)
? ?Subjective: ?CC:Dm ?PCP: [name], DO ?[id number] B Cange is a 61 y.o. female presenting to clinic today for: ? ?1. Type 2 Diabetes with hypertension, hyperlipidemia:  ?Patient is compliant with all medications.  Vision has been stable.  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 ? ?Last eye exam: needs ?Last foot exam: needs ?Last A1c:  ?Lab Results  ?Component Value Date  ? HGBA1C 6.4 (H) 12/17/2021  ? ?Nephropathy screen indicated?:  On ACE inhibitor ?Last flu, zoster and/or pneumovax:  ?Immunization History  ?Administered Date(s) Administered  ? Influenza Split 07/17/2013  ? Influenza,inj,Quad PF,6+ Mos 07/10/2015, 07/05/2016, 06/30/2017, 07/12/2018, 07/23/2019, 07/04/2021  ? Influenza-Unspecified 07/08/2014, 07/11/2020  ? Moderna Sars-Covid-2 Vaccination 01/29/2020, 02/25/2020  ? Pneumococcal Conjugate-13 11/30/2017  ? Pneumococcal Polysaccharide-23 11/05/2020  ? ? ?ROS: No chest pain, shortness of breath reported ? ?2.  Stress ?She has been experiencing some stress surrounding trauma to her right eye last year, her father's passing and most recently her husband having developed cirrhosis and complications.  They have been modifying her lifestyle quite a bit around this new diagnosis and he is doing better.  She is optimistic about the future but had a time with being overwhelmed, very understandably.  She feels well supported by her friends and family and overall is dealing with things really well. ? ?3.  Skin lesion ?Patient reports a skin lesion that is itchy and irritated on her back.  No reports of drainage or bleeding. ? ?ROS: Per HPI ? ?Allergies  ?Allergen Reactions  ? Erythromycin Other (See Comments)  ?  Abdominal pain  ? Codeine Other (See Comments)  ?  Hyper   ? Sulfa Antibiotics Hives  ? Tetracycline Rash  ? ?Past Medical History:  ?Diagnosis Date  ?  Allergy   ? seasonal  ? Anxiety   ? Arthritis   ? Diabetes mellitus   ? type ii  ? GERD (gastroesophageal reflux disease)   ? Hiatal hernia   ? Hyperlipidemia   ? Hypertension   ? Irritable bowel syndrome   ? Obesity   ? Sinus infection 08/14/2017  ? Symptoms resolved after antiobiotic course  ? Tubular adenoma of colon 05/27/2015  ? Ulcer   ? ? ?Current Outpatient Medications:  ?  acetaminophen (TYLENOL) 500 MG tablet, Take 1 tablet (500 mg total) by mouth every 6 (six) hours as needed. (Patient not taking: Reported on 08/17/2021), Disp: 30 tablet, Rfl: 0 ?  acetaZOLAMIDE ER (DIAMOX) 500 MG capsule, Take 1 capsule (500 mg total) by mouth 2 (two) times daily. (Patient not taking: Reported on 08/17/2021), Disp: 60 capsule, Rfl: 0 ?  ALPRAZolam (XANAX) 0.5 MG tablet, Take 1 tablet by mouth 2 times daily as needed for anxiety (flight). (Patient not taking: Reported on 11/16/2021), Disp: 20 tablet, Rfl: 0 ?  amoxicillin-clavulanate (AUGMENTIN) 875-125 MG tablet, Take 1 tablet by mouth 2 (two) times daily., Disp: 14 tablet, Rfl: 0 ?  aspirin EC 81 MG tablet, Take 81 mg by mouth daily. (Patient not taking: Reported on 12/15/2021), Disp: , Rfl:  ?  benzonatate (TESSALON PERLES) 100 MG capsule, Take 1 capsule (100 mg total) by mouth 3 (three) times daily as needed for cough. (Patient not taking: Reported on 08/17/2021), Disp: 20 capsule, Rfl: 0 ?  Blood Glucose Monitoring Suppl (FREESTYLE FREEDOM LITE) w/Device KIT, Test blood sugar BID Dx E11.9 (  Patient not taking: Reported on 11/16/2021), Disp: 1 kit, Rfl: 0 ?  brimonidine (ALPHAGAN) 0.15 % ophthalmic solution, Place 1 drop into the right eye 2 (two) times daily. (Patient not taking: Reported on 11/16/2021), Disp: 5 mL, Rfl: 1 ?  dorzolamide-timolol (COSOPT) 22.3-6.8 MG/ML ophthalmic solution, Place 1 drop into the right eye daily. (Patient not taking: Reported on 11/16/2021), Disp: 10 mL, Rfl: 1 ?  furosemide (LASIX) 40 MG tablet, TAKE 1 TABLET BY MOUTH DAILY. (Patient not  taking: Reported on 12/15/2021), Disp: 90 tablet, Rfl: 0 ?  glucose blood (FREESTYLE LITE) test strip, Use to test blood sugar twice daily as directed, Disp: 200 each, Rfl: 3 ?  Lancets (FREESTYLE) lancets, Test blood sugar BID Dx E11.9, Disp: 200 each, Rfl: 3 ?  metFORMIN (GLUCOPHAGE) 500 MG tablet, TAKE 1 TABLET BY MOUTH 2 TIMES DAILY, Disp: 180 tablet, Rfl: 0 ?  multivitamin-iron-minerals-folic acid (CENTRUM) chewable tablet, Chew 1 tablet by mouth daily. (Patient not taking: Reported on 12/15/2021), Disp: , Rfl:  ?  omeprazole (PRILOSEC) 20 MG capsule, Take 20 mg by mouth daily. (Patient not taking: Reported on 12/15/2021), Disp: , Rfl:  ?  phenazopyridine (PYRIDIUM) 95 MG tablet, Take 1 tablet (95 mg total) by mouth 3 (three) times daily as needed for pain. (Patient not taking: Reported on 12/15/2021), Disp: 10 tablet, Rfl: 0 ?  Pitavastatin Calcium (LIVALO) 2 MG TABS, TAKE 1 TABLET BY MOUTH AT BEDTIME., Disp: 90 tablet, Rfl: 1 ?  prednisoLONE acetate (PRED FORTE) 1 % ophthalmic suspension, Place 1 drop into the right eye in the morning, at noon, and at bedtime. (Patient not taking: Reported on 12/15/2021), Disp: 15 mL, Rfl: 0 ?  predniSONE (DELTASONE) 20 MG tablet, Take 2 tablets (40 mg total) by mouth daily with breakfast for 5 days., Disp: 10 tablet, Rfl: 0 ?  ramipril (ALTACE) 10 MG capsule, TAKE 2 CAPSULES BY MOUTH ONCE A DAY, Disp: 180 capsule, Rfl: 0 ?Social History  ? ?Socioeconomic History  ? Marital status: Married  ?  Spouse name: Not on file  ? Number of children: Not on file  ? Years of education: Not on file  ? Highest education level: Not on file  ?Occupational History  ? Not on file  ?Tobacco Use  ? Smoking status: Never  ? Smokeless tobacco: Never  ? Tobacco comments:  ?  AS A Teenager  ?Vaping Use  ? Vaping Use: Never used  ?Substance and Sexual Activity  ? Alcohol use: Yes  ?  Alcohol/week: 0.0 standard drinks  ?  Comment:  occasional wine  ? Drug use: No  ? Sexual activity: Not on file  ?Other  Topics Concern  ? Not on file  ?Social History Narrative  ? Not on file  ? ?Social Determinants of Health  ? ?Financial Resource Strain: Not on file  ?Food Insecurity: Not on file  ?Transportation Needs: Not on file  ?Physical Activity: Not on file  ?Stress: Not on file  ?Social Connections: Not on file  ?Intimate Partner Violence: Not on file  ? ?Family History  ?Problem Relation Age of Onset  ? Diabetes Mother   ? Kidney disease Mother   ? Colitis Father   ? Colon polyps Father   ? Heart disease Father   ? Irritable bowel syndrome Father   ? Squamous cell carcinoma Father   ? Other Other   ?     corkscrew esophagus- father, sister  ? Crohn's disease Sister   ? Colitis Sister   ?  Colon polyps Sister   ? Ulcerative colitis Sister   ? Diabetes Maternal Aunt   ? Diabetes Paternal Aunt   ? Diabetes Maternal Grandmother   ? Diabetes Paternal Grandmother   ? Heart disease Paternal Grandmother   ? Colitis Paternal Grandfather   ? Colon cancer Neg Hx   ? Esophageal cancer Neg Hx   ? Pancreatic cancer Neg Hx   ? Rectal cancer Neg Hx   ? Stomach cancer Neg Hx   ? ? ?Objective: ?Office vital signs reviewed. ?BP (!) 99/58   Pulse 76   Temp 98.4 ?F (36.9 ?C) (Temporal)   Ht _0  (1.702 m)   Wt 222 lb 3.2 oz (100.8 kg)   LMP 12/26/2014 (Approximate) Comment: 2-3 days  SpO2 98%   BMI 34.80 kg/m?  ? ?Physical Examination:  ?General: Awake, alert, well nourished, No acute distress ?HEENT: Sclera white. ?Cardio: regular rate and rhythm, S1S2 heard, no murmurs appreciated ?Pulm: clear to auscultation bilaterally, no wheezes, rhonchi or rales; normal work of breathing on room air ?MSK: Normal gait and station ?Skin: Hyperkeratotic, irritated lesion noted along the right mid low back.  No active bleeding ?Neuro: see DM foot ?Diabetic Foot Exam - Simple   ?Simple Foot Form ?Diabetic Foot exam was performed with the following findings: Yes 12/18/2021  2:14 PM  ?Visual Inspection ?No deformities, no ulcerations, no other skin  breakdown bilaterally: Yes ?Sensation Testing ?Intact to touch and monofilament testing bilaterally: Yes ?Pulse Check ?Posterior Tibialis and Dorsalis pulse intact bilaterally: Yes ?Comments ?  ? ?Psych: Mood stable, she

## 2021-12-18 NOTE — Patient Instructions (Signed)
Cryoablation, Care After ?This sheet gives you information about how to care for yourself after your procedure. Your health care provider may also give you more specific instructions. If you have problems or questions, contact your health care provider. ?What can I expect after the procedure? ?After the procedure, it is common to have: ?Soreness around the treatment area. ?Mild pain and swelling in the treatment area. ?Follow these instructions at home: ?Treatment area care ? ?If you have an incision, follow instructions from your health care provider about how to take care of it. Make sure you: ?Wash your hands with soap and water for at least 20 seconds before and after you change your bandage (dressing). If soap and water are not available, use hand sanitizer. ?Change your dressing as told by your health care provider. ?Leave stitches (sutures), skin glue, or adhesive strips in place. These skin closures may need to stay in place for 2 weeks or longer. If adhesive strip edges start to loosen and curl up, you may trim the loose edges. Do not remove adhesive strips completely unless your health care provider tells you to do that. ?Check your treatment area every day for signs of infection. Check for: ?More redness, swelling, or pain. ?Fluid or blood. ?Warmth. ?Pus or a bad smell. ?Keep the treated area clean, dry, and covered with a dressing until it has healed. Clean the area with soap and water or as told by your health care provider. If your bandage gets wet, change it right away. ?Do not take baths, swim, or use a hot tub until your health care provider approves. Ask your health care provider if you may take showers. You may only be allowed to take sponge baths. ?Activity ? ?Follow instructions from your health care provider about any activity limitations, including lifting heavy objects. ?If you were given a sedative during the procedure, it can affect you for several hours. Do not drive or operate machinery  until your health care provider says that it is safe. ?General instructions ?Take over-the-counter and prescription medicines only as told by your health care provider. ?Do not use any products that contain nicotine or tobacco, such as cigarettes, e-cigarettes, and chewing tobacco. These can delay incision healing. If you need help quitting, ask your health care provider. ?Keep all follow-up visits as told by your health care provider. This is important. ?Contact a health care provider if: ?You have increased pain. ?You have a fever. ?You have nausea or vomiting. ?You have any of these signs of infection: ?More redness, swelling, or pain around your treatment area. ?Fluid or blood coming from your treatment area. ?Warmth coming from your treatment area. ?Pus or a bad smell coming from your treatment area. ?You do not have a bowel movement for 2 days. ?Get help right away if you have: ?Severe pain. ?Trouble swallowing or breathing. ?Severe weakness or dizziness. ?Chest pain or shortness of breath. ?These symptoms may represent a serious problem that is an emergency. Do not wait to see if the symptoms will go away. Get medical help right away. Call your local emergency services (911 in the [place] Do not drive yourself to the hospital. ?Summary ?After the procedure, it is common for the treatment area to be sore, mildly painful, and swollen. ?If you have a dressing, change it as told by your health care provider. ?Follow instructions from your health care provider about any activity limitations. ?Contact a health care provider if you have increased pain or a fever. ?This information is   not intended to replace advice given to you by your health care provider. Make sure you discuss any questions you have with your health care provider. ?Document Revised: 06/28/2019 Document Reviewed: 06/28/2019 ?Elsevier Patient Education ? 2022 [employer] ? ?

## 2022-01-11 ENCOUNTER — Ambulatory Visit (INDEPENDENT_AMBULATORY_CARE_PROVIDER_SITE_OTHER): Payer: No Typology Code available for payment source | Admitting: Family Medicine

## 2022-01-11 ENCOUNTER — Encounter: Payer: Self-pay | Admitting: Family Medicine

## 2022-01-11 VITALS — BP 131/76 | HR 78 | Temp 98.0°F | Ht 67.0 in | Wt 222.2 lb

## 2022-01-11 DIAGNOSIS — N3000 Acute cystitis without hematuria: Secondary | ICD-10-CM | POA: Diagnosis not present

## 2022-01-11 LAB — URINALYSIS, ROUTINE W REFLEX MICROSCOPIC
Bilirubin, UA: NEGATIVE
Glucose, UA: NEGATIVE
Ketones, UA: NEGATIVE
Nitrite, UA: NEGATIVE
Protein,UA: NEGATIVE
Specific Gravity, UA: 1.01 (ref 1.005–1.030)
Urobilinogen, Ur: 0.2 mg/dL (ref 0.2–1.0)
pH, UA: 6 (ref 5.0–7.5)

## 2022-01-11 LAB — MICROSCOPIC EXAMINATION
Epithelial Cells (non renal): NONE SEEN /hpf (ref 0–10)
Renal Epithel, UA: NONE SEEN /hpf

## 2022-01-11 MED ORDER — CEPHALEXIN 500 MG PO CAPS: 500.0000 mg | ORAL_CAPSULE | Freq: Two times a day (BID) | ORAL | 0 refills | Status: AC

## 2022-01-11 MED ORDER — FLUCONAZOLE 150 MG PO TABS: 150.0000 mg | ORAL_TABLET | Freq: Once | ORAL | 0 refills | Status: AC

## 2022-01-11 NOTE — Progress Notes (Signed)
? ?Subjective: ?CC: UTI ?PCP: [name], DO ?[name] is a 61 y.o. female presenting to clinic today for: ? ?Urinary symptoms ?Patient reports a 4 day h/o urinary frequency, urgency, dysuria.  She tried to drink water and cranberry juice when symptoms onset on Thursday but they unfortunately progressed after she drank a [facility] on Friday at the movies.  No fevers, chills, nausea, vomiting, back pain, vaginal discharge.  Patient has used nothing for symptoms.  Patient has a h/o pyelonephritis and recurrent hospitalization for pyelonephritis as a youth.  She has not had any recent UTIs.  Her urologist Dr. Gaynelle Arabian with [facility] ? ? ?ROS: Per HPI ? ?Allergies  ?Allergen Reactions  ? Erythromycin Other (See Comments)  ?  Abdominal pain  ? Codeine Other (See Comments)  ?  Hyper   ? Sulfa Antibiotics Hives  ? Tetracycline Rash  ? ?Past Medical History:  ?Diagnosis Date  ? Allergy   ? seasonal  ? Anxiety   ? Arthritis   ? Diabetes mellitus   ? type ii  ? GERD (gastroesophageal reflux disease)   ? Hiatal hernia   ? Hyperlipidemia   ? Hypertension   ? Irritable bowel syndrome   ? Obesity   ? Sinus infection 08/14/2017  ? Symptoms resolved after antiobiotic course  ? Tubular adenoma of colon 05/27/2015  ? Ulcer   ? ? ?Current Outpatient Medications:  ?  ALPRAZolam (XANAX) 0.5 MG tablet, Take 1 tablet by mouth 2 times daily as needed for anxiety (flight)., Disp: 20 tablet, Rfl: 0 ?  aspirin EC 81 MG tablet, Take 81 mg by mouth daily., Disp: , Rfl:  ?  Blood Glucose Monitoring Suppl (FREESTYLE FREEDOM LITE) w/Device KIT, Test blood sugar BID Dx E11.9, Disp: 1 kit, Rfl: 0 ?  furosemide (LASIX) 40 MG tablet, TAKE 1 TABLET BY MOUTH DAILY., Disp: 90 tablet, Rfl: 3 ?  glucose blood (FREESTYLE LITE) test strip, Use to test blood sugar twice daily as directed, Disp: 200 each, Rfl: 3 ?  Lancets (FREESTYLE) lancets, Test blood sugar BID Dx E11.9, Disp: 200 each, Rfl: 3 ?  metFORMIN (GLUCOPHAGE) 500 MG  tablet, TAKE 1 TABLET BY MOUTH 2 TIMES DAILY, Disp: 180 tablet, Rfl: 3 ?  multivitamin-iron-minerals-folic acid (CENTRUM) chewable tablet, Chew 1 tablet by mouth daily., Disp: , Rfl:  ?  omeprazole (PRILOSEC) 20 MG capsule, Take 20 mg by mouth daily., Disp: , Rfl:  ?  Pitavastatin Calcium (LIVALO) 2 MG TABS, TAKE 1 TABLET BY MOUTH AT BEDTIME., Disp: 90 tablet, Rfl: 3 ?  ramipril (ALTACE) 10 MG capsule, Take 1 capsule by mouth daily., Disp: 90 capsule, Rfl: 3 ?Social History  ? ?Socioeconomic History  ? Marital status: Married  ?  Spouse name: Not on file  ? Number of children: Not on file  ? Years of education: Not on file  ? Highest education level: Not on file  ?Occupational History  ? Not on file  ?Tobacco Use  ? Smoking status: Never  ? Smokeless tobacco: Never  ? Tobacco comments:  ?  AS A Teenager  ?Vaping Use  ? Vaping Use: Never used  ?Substance and Sexual Activity  ? Alcohol use: Yes  ?  Alcohol/week: 0.0 standard drinks  ?  Comment:  occasional wine  ? Drug use: No  ? Sexual activity: Not on file  ?Other Topics Concern  ? Not on file  ?Social History Narrative  ? Not on file  ? ?Social Determinants of Health  ? ?  Financial Resource Strain: Not on file  ?Food Insecurity: Not on file  ?Transportation Needs: Not on file  ?Physical Activity: Not on file  ?Stress: Not on file  ?Social Connections: Not on file  ?Intimate Partner Violence: Not on file  ? ?Family History  ?Problem Relation Age of Onset  ? Diabetes Mother   ? Kidney disease Mother   ? Colitis Father   ? Colon polyps Father   ? Heart disease Father   ? Irritable bowel syndrome Father   ? Squamous cell carcinoma Father   ? Other Other   ?     corkscrew esophagus- father, sister  ? Crohn's disease Sister   ? Colitis Sister   ? Colon polyps Sister   ? Ulcerative colitis Sister   ? Diabetes Maternal Aunt   ? Diabetes Paternal Aunt   ? Diabetes Maternal Grandmother   ? Diabetes Paternal Grandmother   ? Heart disease Paternal Grandmother   ? Colitis  Paternal Grandfather   ? Colon cancer Neg Hx   ? Esophageal cancer Neg Hx   ? Pancreatic cancer Neg Hx   ? Rectal cancer Neg Hx   ? Stomach cancer Neg Hx   ? ? ?Objective: ?Office vital signs reviewed. ?BP 131/76   Pulse 78   Temp 98 ?F (36.7 ?C)   Ht _0  (1.702 m)   Wt 222 lb 3.2 oz (100.8 kg)   LMP 12/26/2014 (Approximate) Comment: 2-3 days  SpO2 97%   BMI 34.80 kg/m?  ? ?Physical Examination:  ?General: Awake, alert, well nourished, nontoxic.  No acute distress ?GU: Suprapubic tenderness palpation is present.  No CVA tenderness palpation ? ?Assessment/ Plan: ?61 y.o. female  ? ?Acute cystitis without hematuria - Plan: Urinalysis, Routine w reflex microscopic, cephALEXin (KEFLEX) 500 MG capsule, Urine Culture ? ?Urinalysis consistent with acute cystitis.  Keflex sent.  Diflucan sent.  Urine culture ordered given significant urinary tract history.  May need to consider transition over to fluoroquinolones if unusual bacterial growth or resistance noted on culture ? ?Orders Placed This Encounter  ?Procedures  ? Urine Culture  ? Urinalysis, Routine w reflex microscopic  ? ?Meds ordered this encounter  ?Medications  ? cephALEXin (KEFLEX) 500 MG capsule  ?  Sig: Take 1 capsule (500 mg total) by mouth 2 (two) times daily for 7 days.  ?  Dispense:  14 capsule  ?  Refill:  0  ? fluconazole (DIFLUCAN) 150 MG tablet  ?  Sig: Take 1 tablet (150 mg total) by mouth once for 1 dose. May repeat in 3 days if needed for ongoing symptoms  ?  Dispense:  2 tablet  ?  Refill:  0  ? ? ? ?[name], DO ?[place] ?[phone] ? ?

## 2022-01-15 LAB — URINE CULTURE

## 2022-02-12 ENCOUNTER — Other Ambulatory Visit (HOSPITAL_COMMUNITY): Payer: Self-pay

## 2022-03-09 ENCOUNTER — Other Ambulatory Visit (HOSPITAL_COMMUNITY): Payer: Self-pay

## 2022-04-15 ENCOUNTER — Other Ambulatory Visit (HOSPITAL_COMMUNITY): Payer: Self-pay

## 2022-04-19 ENCOUNTER — Other Ambulatory Visit (HOSPITAL_COMMUNITY): Payer: Self-pay

## 2022-04-23 ENCOUNTER — Encounter: Payer: Self-pay | Admitting: Internal Medicine

## 2022-04-29 ENCOUNTER — Encounter: Payer: Self-pay | Admitting: Internal Medicine

## 2022-05-13 ENCOUNTER — Other Ambulatory Visit (HOSPITAL_COMMUNITY): Payer: Self-pay

## 2022-05-27 ENCOUNTER — Other Ambulatory Visit (HOSPITAL_COMMUNITY): Payer: Self-pay

## 2022-06-08 ENCOUNTER — Other Ambulatory Visit (HOSPITAL_COMMUNITY): Payer: Self-pay

## 2022-06-08 ENCOUNTER — Ambulatory Visit (AMBULATORY_SURGERY_CENTER): Payer: Self-pay | Admitting: *Deleted

## 2022-06-08 ENCOUNTER — Other Ambulatory Visit: Payer: Self-pay

## 2022-06-08 VITALS — Ht 67.0 in | Wt 221.0 lb

## 2022-06-08 DIAGNOSIS — Z86010 Personal history of colonic polyps: Secondary | ICD-10-CM

## 2022-06-08 MED ORDER — NA SULFATE-K SULFATE-MG SULF 17.5-3.13-1.6 GM/177ML PO SOLN
1.0000 | Freq: Once | ORAL | 0 refills | Status: AC
  Filled 2022-06-08: qty 354, 1d supply, fill #0

## 2022-06-08 NOTE — Progress Notes (Signed)
Pre visit completed in person. No egg or soy allergy known to patient  No issues known to pt with past sedation with any surgeries or procedures Patient denies ever being told they had issues or difficulty with intubation  No FH of Malignant Hyperthermia Pt is not on diet pills Pt is not on  home 02  Pt is not on blood thinners  Pt denies issues with constipation  No A fib or A flutter    

## 2022-06-10 ENCOUNTER — Ambulatory Visit (INDEPENDENT_AMBULATORY_CARE_PROVIDER_SITE_OTHER): Payer: No Typology Code available for payment source | Admitting: Family Medicine

## 2022-06-10 ENCOUNTER — Encounter: Payer: Self-pay | Admitting: Family Medicine

## 2022-06-10 VITALS — BP 110/63 | HR 70 | Temp 97.0°F | Resp 20 | Ht 67.0 in | Wt 222.0 lb

## 2022-06-10 DIAGNOSIS — H65111 Acute and subacute allergic otitis media (mucoid) (sanguinous) (serous), right ear: Secondary | ICD-10-CM

## 2022-06-10 MED ORDER — AMOXICILLIN 875 MG PO TABS: 875.0000 mg | ORAL_TABLET | Freq: Two times a day (BID) | ORAL | 0 refills | Status: AC

## 2022-06-10 NOTE — Progress Notes (Signed)
   Acute Office Visit  Subjective:     Patient ID: [name], female    DOB: 01-08-1961, 61 y.o.   MRN: [id number]  Chief Complaint  Patient presents with  . Ear Fullness    Ear Fullness  There is pain in the right ear. This is a new problem. The current episode started in the past 7 days. The problem occurs constantly. The problem has been gradually worsening. There has been no fever. The patient is experiencing no pain. Associated symptoms include hearing loss (muffled hearing). Pertinent negatives include no abdominal pain, coughing, diarrhea, ear discharge, headaches, rash, rhinorrhea, sore throat or vomiting. She has tried NSAIDs for the symptoms. The treatment provided mild relief. Her past medical history is significant for a chronic ear infection.    Review of Systems  HENT:  Positive for hearing loss (muffled hearing). Negative for ear discharge, rhinorrhea and sore throat.   Respiratory:  Negative for cough.   Gastrointestinal:  Negative for abdominal pain, diarrhea and vomiting.  Skin:  Negative for rash.  Neurological:  Negative for headaches.        Objective:    BP 110/63   Pulse 70   Temp (!) 97 F (36.1 C)   Resp 20   Ht '5\' 7"'$  (1.702 m)   Wt 222 lb (100.7 kg)   LMP 12/26/2014 (Approximate) Comment: 2-3 days  SpO2 96%   BMI 34.77 kg/m    Physical Exam Vitals and nursing note reviewed.  Constitutional:      General: She is not in acute distress.    Appearance: She is not ill-appearing, toxic-appearing or diaphoretic.  HENT:     Right Ear: Ear canal and external ear normal. A middle ear effusion (mucoid) is present. Tympanic membrane is bulging. Tympanic membrane is not injected, scarred, perforated, erythematous or retracted.     Left Ear: Ear canal and external ear normal.  No middle ear effusion. Tympanic membrane is scarred. Tympanic membrane is not injected, perforated, erythematous, retracted or bulging.  Pulmonary:     Effort: Pulmonary effort  is normal. No respiratory distress.  Skin:    General: Skin is warm and dry.  Neurological:     General: No focal deficit present.     Mental Status: She is alert and oriented to person, place, and time.  Psychiatric:        Mood and Affect: Mood normal.        Behavior: Behavior normal.    No results found for any visits on 06/10/22.      Assessment & Plan:   Essance was seen today for ear fullness.  Diagnoses and all orders for this visit:  Acute mucoid otitis media of right ear Amoxicillin as below. Tylenol, advil prn for pain. Return to office for new or worsening symptoms, or if symptoms persist.  -     amoxicillin (AMOXIL) 875 MG tablet; Take 1 tablet (875 mg total) by mouth 2 (two) times daily for 10 days.  The patient indicates understanding of these issues and agrees with the plan.  Gwenlyn Perking, FNP

## 2022-06-10 NOTE — Patient Instructions (Signed)
Earache, Adult ?An earache, or ear pain, can be caused by many things, including: ?An infection. ?Ear wax buildup. ?Ear pressure. ?Something in the ear that should not be there (foreign body). ?A sore throat. ?Tooth problems. ?Jaw problems. ?Treatment of the earache will depend on the cause. If the cause is not clear or cannot be determined, you may need to watch your symptoms until your earache goes away or until a cause is found. ?Follow these instructions at home: ?Medicines ?Take or apply over-the-counter and prescription medicines only as told by your health care provider. ?If you were prescribed an antibiotic medicine, use it as told by your health care provider. Do not stop using the antibiotic even if you start to feel better. ?Do not put anything in your ear other than medicine that is prescribed by your health care provider. ?Managing pain ?If directed, apply heat to the affected area as often as told by your health care provider. Use the heat source that your health care provider recommends, such as a moist heat pack or a heating pad. ?Place a towel between your skin and the heat source. ?Leave the heat on for 20-30 minutes. ?Remove the heat if your skin turns bright red. This is especially important if you are unable to feel pain, heat, or cold. You may have a greater risk of getting burned. ?If directed, put ice on the affected area as often as told by your health care provider. To do this: ? ?  ? ?Put ice in a plastic bag. ?Place a towel between your skin and the bag. ?Leave the ice on for 20 minutes, 2-3 times a day. ?General instructions ?Pay attention to any changes in your symptoms. ?Try resting in an upright position instead of lying down. This may help to reduce pressure in your ear and relieve pain. ?Chew gum if it helps to relieve your ear pain. ?Treat any allergies as told by your health care provider. ?Drink enough fluid to keep your urine pale yellow. ?It is up to you to get the results of  any tests that were done. Ask your health care provider, or the department that is doing the tests, when your results will be ready. ?Keep all follow-up visits as told by your health care provider. This is important. ?Contact a health care provider if: ?Your pain does not improve within 2 days. ?Your earache gets worse. ?You have new symptoms. ?You have a fever. ?Get help right away if you: ?Have a severe headache. ?Have a stiff neck. ?Have trouble swallowing. ?Have redness or swelling behind your ear. ?Have fluid or blood coming from your ear. ?Have hearing loss. ?Feel dizzy. ?Summary ?An earache, or ear pain, can be caused by many things. ?Treatment of the earache will depend on the cause. Follow recommendations from your health care provider to treat your ear pain. ?If the cause is not clear or cannot be determined, you may need to watch your symptoms until your earache goes away or until a cause is found. ?Keep all follow-up visits as told by your health care provider. This is important. ?This information is not intended to replace advice given to you by your health care provider. Make sure you discuss any questions you have with your health care provider. ?Document Revised: 04/27/2019 Document Reviewed: 04/28/2019 ?Elsevier Patient Education ? 2023 [employer] ? ?

## 2022-06-17 ENCOUNTER — Telehealth: Payer: Self-pay

## 2022-06-17 NOTE — Telephone Encounter (Signed)
Pt aware - will do spray and let us know of no better

## 2022-06-17 NOTE — Telephone Encounter (Signed)
Patient seen [name] on 06/10/22 and states that ear feels muffled and she feels like she still has fluid in it. Still on abx.  Could ear drops be sent in. Covering provider and PCP off. Will send to prescribing providers back up to advise.

## 2022-06-18 ENCOUNTER — Ambulatory Visit (INDEPENDENT_AMBULATORY_CARE_PROVIDER_SITE_OTHER): Payer: No Typology Code available for payment source | Admitting: Family Medicine

## 2022-06-18 ENCOUNTER — Encounter: Payer: Self-pay | Admitting: Family Medicine

## 2022-06-18 DIAGNOSIS — H65111 Acute and subacute allergic otitis media (mucoid) (sanguinous) (serous), right ear: Secondary | ICD-10-CM | POA: Diagnosis not present

## 2022-06-18 MED ORDER — CIPROFLOXACIN-DEXAMETHASONE 0.3-0.1 % OT SUSP: 4.0000 [drp] | Freq: Two times a day (BID) | OTIC | 0 refills | Status: AC

## 2022-06-18 NOTE — Patient Instructions (Signed)
Eustachian Tube Dysfunction  Eustachian tube dysfunction refers to a condition in which a blockage develops in the narrow passage that connects the middle ear to the back of the nose (eustachian tube). The eustachian tube regulates air pressure in the middle ear by letting air move between the ear and nose. It also helps to drain fluid from the middle ear space. Eustachian tube dysfunction can affect one or both ears. When the eustachian tube does not function properly, air pressure, fluid, or both can build up in the middle ear. What are the causes? This condition occurs when the eustachian tube becomes blocked or cannot open normally. Common causes of this condition include: Ear infections. Colds and other infections that affect the nose, mouth, and throat (upper respiratory tract). Allergies. Irritation from cigarette smoke. Irritation from stomach acid coming up into the esophagus (gastroesophageal reflux). The esophagus is the part of the body that moves food from the mouth to the stomach. Sudden changes in air pressure, such as from descending in an airplane or scuba diving. Abnormal growths in the nose or throat, such as: Growths that line the nose (nasal polyps). Abnormal growth of cells (tumors). Enlarged tissue at the back of the throat (adenoids). What increases the risk? You are more likely to develop this condition if: You smoke. You are overweight. You are a child who has: Certain birth defects of the mouth, such as cleft palate. Large tonsils or adenoids. What are the signs or symptoms? Common symptoms of this condition include: A feeling of fullness in the ear. Ear pain. Clicking or popping noises in the ear. Ringing in the ear (tinnitus). Hearing loss. Loss of balance. Dizziness. Symptoms may get worse when the air pressure around you changes, such as when you travel to an area of high elevation, fly on an airplane, or go scuba diving. How is this diagnosed? This  condition may be diagnosed based on: Your symptoms. A physical exam of your ears, nose, and throat. Tests, such as those that measure: The movement of your eardrum. Your hearing (audiometry). How is this treated? Treatment depends on the cause and severity of your condition. In mild cases, you may relieve your symptoms by moving air into your ears. This is called "popping the ears." In more severe cases, or if you have symptoms of fluid in your ears, treatment may include: Medicines to relieve congestion (decongestants). Medicines that treat allergies (antihistamines). Nasal sprays or ear drops that contain medicines that reduce swelling (steroids). A procedure to drain the fluid in your eardrum. In this procedure, a small tube may be placed in the eardrum to: Drain the fluid. Restore the air in the middle ear space. A procedure to insert a balloon device through the nose to inflate the opening of the eustachian tube (balloon dilation). Follow these instructions at home: Lifestyle Do not do any of the following until your health care provider approves: Travel to high altitudes. Fly in airplanes. Work in a pressurized cabin or room. Scuba dive. Do not use any products that contain nicotine or tobacco. These products include cigarettes, chewing tobacco, and vaping devices, such as e-cigarettes. If you need help quitting, ask your health care provider. Keep your ears dry. Wear fitted earplugs during showering and bathing. Dry your ears completely after. General instructions Take over-the-counter and prescription medicines only as told by your health care provider. Use techniques to help pop your ears as recommended by your health care provider. These may include: Chewing gum. Yawning. Frequent, forceful swallowing.   Closing your mouth, holding your nose closed, and gently blowing as if you are trying to blow air out of your nose. Keep all follow-up visits. This is important. Contact a  health care provider if: Your symptoms do not go away after treatment. Your symptoms come back after treatment. You are unable to pop your ears. You have: A fever. Pain in your ear. Pain in your head or neck. Fluid draining from your ear. Your hearing suddenly changes. You become very dizzy. You lose your balance. Get help right away if: You have a sudden, severe increase in any of your symptoms. Summary Eustachian tube dysfunction refers to a condition in which a blockage develops in the eustachian tube. It can be caused by ear infections, allergies, inhaled irritants, or abnormal growths in the nose or throat. Symptoms may include ear pain or fullness, hearing loss, or ringing in the ears. Mild cases are treated with techniques to unblock the ears, such as yawning or chewing gum. More severe cases are treated with medicines or procedures. This information is not intended to replace advice given to you by your health care provider. Make sure you discuss any questions you have with your health care provider. Document Revised: 12/01/2020 Document Reviewed: 12/01/2020 Elsevier Patient Education  2023 [employer] Inc[employer]  

## 2022-06-18 NOTE — Progress Notes (Signed)
Subjective: CC: Otitis PCP: [name], DO IOX:BDZHGD B Rolfson is a 61 y.o. female presenting to clinic today for:  1.  Otitis media Patient reports some mild to moderate discomfort of the right ear.  She was seen on 06/10/2022 and placed on amoxicillin for otitis media.  She does not report any fevers, chills, dizziness or drainage.   ROS: Per HPI  Allergies  Allergen Reactions   Erythromycin Other (See Comments)    Abdominal pain   Codeine Other (See Comments)    Hyper    Sulfa Antibiotics Hives   Tetracycline Rash   Past Medical History:  Diagnosis Date   Allergy    seasonal   Anxiety    Arthritis    Diabetes mellitus    type ii   GERD (gastroesophageal reflux disease)    Hiatal hernia    Hyperlipidemia    Hypertension    Irritable bowel syndrome    Obesity    Sinus infection 08/14/2017   Symptoms resolved after antiobiotic course   Tubular adenoma of colon 05/27/2015   Ulcer     Current Outpatient Medications:    ALPRAZolam (XANAX) 0.5 MG tablet, Take 1 tablet by mouth 2 times daily as needed for anxiety (flight)., Disp: 20 tablet, Rfl: 0   amoxicillin (AMOXIL) 875 MG tablet, Take 1 tablet (875 mg total) by mouth 2 (two) times daily for 10 days., Disp: 20 tablet, Rfl: 0   aspirin EC 81 MG tablet, Take 81 mg by mouth daily., Disp: , Rfl:    Blood Glucose Monitoring Suppl (FREESTYLE FREEDOM LITE) w/Device KIT, Test blood sugar BID Dx E11.9, Disp: 1 kit, Rfl: 0   furosemide (LASIX) 40 MG tablet, TAKE 1 TABLET BY MOUTH DAILY., Disp: 90 tablet, Rfl: 3   glucose blood (FREESTYLE LITE) test strip, Use to test blood sugar twice daily as directed, Disp: 200 each, Rfl: 3   Lancets (FREESTYLE) lancets, Test blood sugar BID Dx E11.9, Disp: 200 each, Rfl: 3   metFORMIN (GLUCOPHAGE) 500 MG tablet, TAKE 1 TABLET BY MOUTH 2 TIMES DAILY, Disp: 180 tablet, Rfl: 3   multivitamin-iron-minerals-folic acid (CENTRUM) chewable tablet, Chew 1 tablet by mouth daily., Disp: , Rfl:     omeprazole (PRILOSEC) 20 MG capsule, Take 20 mg by mouth daily., Disp: , Rfl:    Pitavastatin Calcium (LIVALO) 2 MG TABS, TAKE 1 TABLET BY MOUTH AT BEDTIME., Disp: 90 tablet, Rfl: 3   ramipril (ALTACE) 10 MG capsule, Take 1 capsule by mouth daily., Disp: 90 capsule, Rfl: 3 Social History   Socioeconomic History   Marital status: Married    Spouse name: Not on file   Number of children: Not on file   Years of education: Not on file   Highest education level: Not on file  Occupational History   Not on file  Tobacco Use   Smoking status: Never   Smokeless tobacco: Never   Tobacco comments:    AS A Teenager  Vaping Use   Vaping Use: Never used  Substance and Sexual Activity   Alcohol use: Yes    Alcohol/week: 0.0 standard drinks of alcohol    Comment:  occasional wine   Drug use: No   Sexual activity: Not on file  Other Topics Concern   Not on file  Social History Narrative   Not on file   Social Determinants of Health   Financial Resource Strain: Not on file  Food Insecurity: Not on file  Transportation Needs: Not on file  Physical Activity: Not on file  Stress: Not on file  Social Connections: Not on file  Intimate Partner Violence: Not on file   Family History  Problem Relation Age of Onset   Diabetes Mother    Kidney disease Mother    Colitis Father    Colon polyps Father    Heart disease Father    Irritable bowel syndrome Father    Squamous cell carcinoma Father    Other Other        corkscrew esophagus- father, sister   Crohn's disease Sister    Colitis Sister    Colon polyps Sister    Ulcerative colitis Sister    Diabetes Maternal Aunt    Diabetes Paternal Aunt    Diabetes Maternal Grandmother    Diabetes Paternal Grandmother    Heart disease Paternal Grandmother    Colitis Paternal Grandfather    Colon cancer Neg Hx    Esophageal cancer Neg Hx    Pancreatic cancer Neg Hx    Rectal cancer Neg Hx    Stomach cancer Neg Hx     Objective: LMP  12/26/2014 (Approximate) Comment: 2-3 days  Physical Examination:  General: Awake, alert, well nourished, No acute distress HEENT:   Ears: Right TM is opaque and slightly bulging.  No appreciable erythema or hemotympanums appreciated.  No drainage within the external auditory canal.  Left TM flat.  Poor light reflex  Assessment/ Plan: 61 y.o. female   Acute mucoid otitis media of right ear - Plan: ciprofloxacin-dexamethasone (CIPRODEX) OTIC suspension  Ongoing pain and pressure despite oral antibiotic.  May be related to poor eustachian tube drainage but will give Ciprodex for comfort.  No red flag signs or symptoms on exam  No orders of the defined types were placed in this encounter.  No orders of the defined types were placed in this encounter.    [name], DO [place] [phone]

## 2022-06-25 ENCOUNTER — Encounter: Payer: Self-pay | Admitting: Internal Medicine

## 2022-07-09 ENCOUNTER — Encounter: Payer: Self-pay | Admitting: Internal Medicine

## 2022-07-09 ENCOUNTER — Ambulatory Visit (AMBULATORY_SURGERY_CENTER): Payer: No Typology Code available for payment source | Admitting: Internal Medicine

## 2022-07-09 VITALS — BP 110/77 | HR 68 | Temp 98.6°F | Resp 12 | Ht 67.0 in | Wt 222.0 lb

## 2022-07-09 DIAGNOSIS — D120 Benign neoplasm of cecum: Secondary | ICD-10-CM

## 2022-07-09 DIAGNOSIS — D123 Benign neoplasm of transverse colon: Secondary | ICD-10-CM

## 2022-07-09 DIAGNOSIS — Z09 Encounter for follow-up examination after completed treatment for conditions other than malignant neoplasm: Secondary | ICD-10-CM | POA: Diagnosis present

## 2022-07-09 DIAGNOSIS — Z86010 Personal history of colonic polyps: Secondary | ICD-10-CM | POA: Diagnosis not present

## 2022-07-09 DIAGNOSIS — K635 Polyp of colon: Secondary | ICD-10-CM

## 2022-07-09 MED ORDER — SODIUM CHLORIDE 0.9 % IV SOLN: 500.0000 mL | Freq: Once | INTRAVENOUS | Status: DC

## 2022-07-09 NOTE — Progress Notes (Signed)
Called to room to assist during endoscopic procedure.  Patient ID and intended procedure confirmed with present staff. Received instructions for my participation in the procedure from the performing physician.  

## 2022-07-09 NOTE — Patient Instructions (Signed)
Read all of the handouts given to you by  your recovery room nurse.  YOU HAD AN ENDOSCOPIC PROCEDURE TODAY AT [facility]:   Refer to the procedure report that was given to you for any specific questions about what was found during the examination.  If the procedure report does not answer your questions, please call your gastroenterologist to clarify.  If you requested that your care partner not be given the details of your procedure findings, then the procedure report has been included in a sealed envelope for you to review at your convenience later.  YOU SHOULD EXPECT: Some feelings of bloating in the abdomen. Passage of more gas than usual.  Walking can help get rid of the air that was put into your GI tract during the procedure and reduce the bloating. If you had a lower endoscopy (such as a colonoscopy or flexible sigmoidoscopy) you may notice spotting of blood in your stool or on the toilet paper. If you underwent a bowel prep for your procedure, you may not have a normal bowel movement for a few days.  Please Note:  You might notice some irritation and congestion in your nose or some drainage.  This is from the oxygen used during your procedure.  There is no need for concern and it should clear up in a day or so.  SYMPTOMS TO REPORT IMMEDIATELY:  Following lower endoscopy (colonoscopy or flexible sigmoidoscopy):  Excessive amounts of blood in the stool  Significant tenderness or worsening of abdominal pains  Swelling of the abdomen that is new, acute  Fever of 100F or higher   For urgent or emergent issues, a gastroenterologist can be reached at any hour by calling [phone]. Do not use MyChart messaging for urgent concerns.    DIET:  We do recommend a small meal at first, but then you may proceed to your regular diet.  Drink plenty of fluids but you should avoid alcoholic beverages for 24 hours.  Try to increase the fiber in your diet, and drink plenty of  water.  ACTIVITY:  You should plan to take it easy for the rest of today and you should NOT DRIVE or use heavy machinery until tomorrow (because of the sedation medicines used during the test).    FOLLOW UP: Our staff will call the number listed on your records the next business day following your procedure.  We will call around 7:15- 8:00 am to check on you and address any questions or concerns that you may have regarding the information given to you following your procedure. If we do not reach you, we will leave a message.     If any biopsies were taken you will be contacted by phone or by letter within the next 1-3 weeks.  Please call us at [phone] if you have not heard about the biopsies in 3 weeks.    SIGNATURES/CONFIDENTIALITY: You and/or your care partner have signed paperwork which will be entered into your electronic medical record.  These signatures attest to the fact that that the information above on your After Visit Summary has been reviewed and is understood.  Full responsibility of the confidentiality of this discharge information lies with you and/or your care-partner.

## 2022-07-09 NOTE — Progress Notes (Signed)
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  The nature of the procedure, as well as the risks, benefits, and alternatives were carefully and thoroughly reviewed with the patient. Ample time for discussion and questions allowed. The patient understood, was satisfied, and agreed to proceed.     HPI: [name] is a 61 y.o. female who presents for surveillance colonoscopy.  Medical history as below.  Tolerated the prep.  No recent chest pain or shortness of breath.  No abdominal pain today.  Past Medical History:  Diagnosis Date   Allergy    seasonal   Anxiety    Arthritis    Diabetes mellitus    type ii   GERD (gastroesophageal reflux disease)    Hiatal hernia    Hyperlipidemia    Hypertension    Irritable bowel syndrome    Obesity    Sinus infection 08/14/2017   Symptoms resolved after antiobiotic course   Tubular adenoma of colon 05/27/2015   Ulcer     Past Surgical History:  Procedure Laterality Date   CHOLECYSTECTOMY  2009   COLONOSCOPY     POLYPECTOMY      Prior to Admission medications   Medication Sig Start Date End Date Taking? Authorizing Provider  aspirin EC 81 MG tablet Take 81 mg by mouth daily.   Yes [provider]  Blood Glucose Monitoring Suppl (FREESTYLE FREEDOM LITE) w/Device KIT Test blood sugar BID Dx E11.9 09/06/19  Yes Gottschalk, Ashly M, DO  furosemide (LASIX) 40 MG tablet TAKE 1 TABLET BY MOUTH DAILY. 12/18/21 12/18/22 Yes Gottschalk, Leatrice Jewels M, DO  glucose blood (FREESTYLE LITE) test strip Use to test blood sugar twice daily as directed 12/03/21  Yes Gottschalk, Ashly M, DO  ibuprofen (ADVIL) 200 MG tablet Take 200 mg by mouth every 6 (six) hours as needed.   Yes [provider]  Lancets (FREESTYLE) lancets Test  blood sugar BID Dx E11.9 09/06/19  Yes Gottschalk, Ashly M, DO  metFORMIN (GLUCOPHAGE) 500 MG tablet TAKE 1 TABLET BY MOUTH 2 TIMES DAILY 12/18/21 12/18/22 Yes Gottschalk, Leatrice Jewels M, DO  multivitamin-iron-minerals-folic acid (CENTRUM) chewable tablet Chew 1 tablet by mouth daily.   Yes [provider]  omeprazole (PRILOSEC) 20 MG capsule Take 20 mg by mouth daily.   Yes [provider]  Pitavastatin Calcium (LIVALO) 2 MG TABS TAKE 1 TABLET BY MOUTH AT BEDTIME. 12/18/21 12/18/22 Yes Gottschalk, Ashly M, DO  ramipril (ALTACE) 10 MG capsule Take 1 capsule by mouth daily. 12/18/21 12/18/22 Yes Gottschalk, Leatrice Jewels M, DO  ALPRAZolam Duanne Moron) 0.5 MG tablet Take 1 tablet by mouth 2 times daily as needed for anxiety (flight). Patient not taking: Reported on 07/09/2022 05/25/21   [name], DO    Current Outpatient Medications  Medication Sig Dispense Refill   aspirin EC 81 MG tablet Take 81 mg by mouth daily.     Blood Glucose Monitoring Suppl (FREESTYLE FREEDOM LITE) w/Device KIT Test blood sugar BID Dx E11.9 1 kit 0   furosemide (LASIX) 40 MG tablet TAKE 1 TABLET BY MOUTH DAILY. 90 tablet 3   glucose blood (FREESTYLE LITE) test strip Use to test blood sugar twice daily as directed 200 each 3   ibuprofen (ADVIL) 200 MG tablet Take 200 mg by mouth  every 6 (six) hours as needed.     Lancets (FREESTYLE) lancets Test blood sugar BID Dx E11.9 200 each 3   metFORMIN (GLUCOPHAGE) 500 MG tablet TAKE 1 TABLET BY MOUTH 2 TIMES DAILY 180 tablet 3   multivitamin-iron-minerals-folic acid (CENTRUM) chewable tablet Chew 1 tablet by mouth daily.     omeprazole (PRILOSEC) 20 MG capsule Take 20 mg by mouth daily.     Pitavastatin Calcium (LIVALO) 2 MG TABS TAKE 1 TABLET BY MOUTH AT BEDTIME. 90 tablet 3   ramipril (ALTACE) 10 MG capsule Take 1 capsule by mouth daily. 90 capsule 3   ALPRAZolam (XANAX) 0.5 MG tablet Take 1 tablet by mouth 2 times daily as needed for anxiety (flight). (Patient not taking:  Reported on 07/09/2022) 20 tablet 0   Current Facility-Administered Medications  Medication Dose Route Frequency Provider Last Rate Last Admin   0.9 %  sodium chloride infusion  500 mL Intravenous Once Kamela Blansett, Lajuan Lines, MD        Allergies as of 07/09/2022 - Review Complete 07/09/2022  Allergen Reaction Noted   Erythromycin Other (See Comments) 05/12/2012   Codeine Other (See Comments) 05/12/2012   Sulfa antibiotics Hives 05/12/2012   Tetracycline Rash     Family History  Problem Relation Age of Onset   Diabetes Mother    Kidney disease Mother    Colitis Father    Colon polyps Father    Heart disease Father    Irritable bowel syndrome Father    Squamous cell carcinoma Father    Other Other        corkscrew esophagus- father, sister   Crohn's disease Sister    Colitis Sister    Colon polyps Sister    Ulcerative colitis Sister    Diabetes Maternal Aunt    Diabetes Paternal Aunt    Diabetes Maternal Grandmother    Diabetes Paternal Grandmother    Heart disease Paternal Grandmother    Colitis Paternal Grandfather    Colon cancer Neg Hx    Esophageal cancer Neg Hx    Pancreatic cancer Neg Hx    Rectal cancer Neg Hx    Stomach cancer Neg Hx     Social History   Socioeconomic History   Marital status: Married    Spouse name: Not on file   Number of children: Not on file   Years of education: Not on file   Highest education level: Not on file  Occupational History   Not on file  Tobacco Use   Smoking status: Never   Smokeless tobacco: Never   Tobacco comments:    AS A Teenager  Vaping Use   Vaping Use: Never used  Substance and Sexual Activity   Alcohol use: Yes    Alcohol/week: 0.0 standard drinks of alcohol    Comment:  occasional wine   Drug use: No   Sexual activity: Not on file  Other Topics Concern   Not on file  Social History Narrative   Not on file   Social Determinants of Health   Financial Resource Strain: Not on file  Food Insecurity: Not on  file  Transportation Needs: Not on file  Physical Activity: Not on file  Stress: Not on file  Social Connections: Not on file  Intimate Partner Violence: Not on file    Physical Exam: Vital signs in last 24 hours: '@BP'  (!) 168/88   Pulse 85   Temp 98.6 F (37 C) (Skin)   Resp 10   Ht 5'  7" (1.702 m)   Wt 222 lb (100.7 kg)   LMP 12/26/2014 (Approximate) Comment: 2-3 days  SpO2 100%   BMI 34.77 kg/m  GEN: NAD EYE: Sclerae anicteric ENT: MMM CV: Non-tachycardic Pulm: CTA b/l GI: Soft, NT/ND NEURO:  Alert & Oriented x 3   Zenovia Jarred, MD [facility]  07/09/2022 1:32 PM

## 2022-07-09 NOTE — Progress Notes (Signed)
Pt resting comfortably. VSS. Airway intact. SBAR complete to RN. All questions answered.   

## 2022-07-09 NOTE — Op Note (Signed)
[place] Patient Name: [name] Procedure Date: 07/09/2022 1:24 PM MRN: [id number] Endoscopist: Jerene Bears , MD Age: 61 Referring MD:  Date of Birth: September 16, 1961 Gender: Female Account #: [id number] Procedure:                Colonoscopy Indications:              High risk colon cancer surveillance: Personal                            history of sessile serrated colon polyp with                            dysplasia, Last colonoscopy: June 2020 Medicines:                Monitored Anesthesia Care Procedure:                Pre-Anesthesia Assessment:                           - Prior to the procedure, a History and Physical                            was performed, and patient medications and                            allergies were reviewed. The patient's tolerance of                            previous anesthesia was also reviewed. The risks                            and benefits of the procedure and the sedation                            options and risks were discussed with the patient.                            All questions were answered, and informed consent                            was obtained. Prior Anticoagulants: The patient has                            taken no previous anticoagulant or antiplatelet                            agents. ASA Grade Assessment: III - A patient with                            severe systemic disease. After reviewing the risks                            and benefits, the patient was deemed in  satisfactory condition to undergo the procedure.                           After obtaining informed consent, the colonoscope                            was passed under direct vision. Throughout the                            procedure, the patient's blood pressure, pulse, and                            oxygen saturations were monitored continuously. The                            Olympus CF-HQ190L [id number] [phone])  Colonoscope was                            introduced through the anus and advanced to the                            cecum, identified by appendiceal orifice and                            ileocecal valve. The colonoscopy was performed                            without difficulty. The patient tolerated the                            procedure well. The quality of the bowel                            preparation was good. The ileocecal valve,                            appendiceal orifice, and rectum were photographed. Scope In: 1:35:16 PM Scope Out: 1:53:14 PM Scope Withdrawal Time: 0 hours 11 minutes 32 seconds  Total Procedure Duration: 0 hours 17 minutes 58 seconds  Findings:                 The digital rectal exam was normal.                           A 8 mm polyp was found in the cecum. The polyp was                            sessile. The polyp was removed with a cold snare.                            Resection and retrieval were complete.                           A 6 mm polyp was found in the hepatic flexure. The  polyp was sessile. The polyp was removed with a                            cold snare. Resection and retrieval were complete.                           Multiple small-mouthed diverticula were found in                            the sigmoid colon and descending colon.                           Internal hemorrhoids were found during                            retroflexion. The hemorrhoids were small. Complications:            No immediate complications. Estimated Blood Loss:     Estimated blood loss was minimal. Impression:               - One 8 mm polyp in the cecum, removed with a cold                            snare. Resected and retrieved.                           - One 6 mm polyp at the hepatic flexure, removed                            with a cold snare. Resected and retrieved.                           - Mild diverticulosis in the  sigmoid colon and in                            the descending colon.                           - Small internal hemorrhoids. Recommendation:           - Patient has a contact number available for                            emergencies. The signs and symptoms of potential                            delayed complications were discussed with the                            patient. Return to normal activities tomorrow.                            Written discharge instructions were provided to the  patient.                           - Resume previous diet.                           - Continue present medications.                           - Await pathology results.                           - Repeat colonoscopy is recommended for                            surveillance. The colonoscopy date will be                            determined after pathology results from today's                            exam become available for review. Jerene Bears, MD 07/09/2022 1:56:53 PM This report has been signed electronically.

## 2022-07-12 ENCOUNTER — Telehealth: Payer: Self-pay | Admitting: *Deleted

## 2022-07-12 ENCOUNTER — Other Ambulatory Visit (HOSPITAL_COMMUNITY): Payer: Self-pay

## 2022-07-12 NOTE — Telephone Encounter (Signed)
  Follow up Call-     07/09/2022   12:53 PM  Call back number  Post procedure Call Back phone  # [phone]  Permission to leave phone message Yes     Patient questions:  Do you have a fever, pain , or abdominal swelling? No. Pain Score  0 *  Have you tolerated food without any problems? Yes.    Have you been able to return to your normal activities? Yes.    Do you have any questions about your discharge instructions: Diet   No. Medications  No. Follow up visit  No.  Do you have questions or concerns about your Care? No.  Actions: * If pain score is 4 or above: No action needed, pain <4.

## 2022-07-19 ENCOUNTER — Encounter: Payer: Self-pay | Admitting: Internal Medicine

## 2022-08-13 ENCOUNTER — Other Ambulatory Visit (HOSPITAL_COMMUNITY): Payer: Self-pay

## 2022-08-18 ENCOUNTER — Ambulatory Visit (INDEPENDENT_AMBULATORY_CARE_PROVIDER_SITE_OTHER): Payer: No Typology Code available for payment source | Admitting: Family Medicine

## 2022-08-18 ENCOUNTER — Encounter: Payer: Self-pay | Admitting: Family Medicine

## 2022-08-18 VITALS — BP 126/72 | HR 78 | Temp 97.9°F | Ht 67.0 in | Wt 222.0 lb

## 2022-08-18 DIAGNOSIS — M25562 Pain in left knee: Secondary | ICD-10-CM | POA: Diagnosis not present

## 2022-08-18 MED ORDER — METHYLPREDNISOLONE ACETATE 80 MG/ML IJ SUSP
80.0000 mg | Freq: Once | INTRAMUSCULAR | Status: AC
  Administered 2022-08-18: 80 mg via INTRAMUSCULAR

## 2022-08-18 MED ORDER — PREDNISONE 10 MG (21) PO TBPK: ORAL_TABLET | ORAL | 0 refills | Status: DC

## 2022-08-18 NOTE — Progress Notes (Signed)
Subjective: CC: Left knee pain PCP: [name], DO ZDG:[id number] B [name] is a 61 y.o. female presenting to clinic today for:  1.  Left knee pain Patient reports sudden onset of right knee pain after she was going down some stairs yesterday afternoon.  She has history of both medial and lateral meniscal tear in that right knee and has been previously told that she may need a total knee replacement but symptoms have gotten a lot better and only yesterday that she started having issues again.  She has been taking ibuprofen and Tylenol with some relief but really no resolution.  She is surprisingly not had as much swelling of the knee as she expected.  She has been trying to keep it in a position of comfort which typically is at about 8-10 degrees flexion.  Though she is not able to fully flex the knee due to pain and swelling.     ROS: Per HPI  Allergies  Allergen Reactions   Erythromycin Other (See Comments)    Abdominal pain   Codeine Other (See Comments)    Hyper    Sulfa Antibiotics Hives   Tetracycline Rash   Past Medical History:  Diagnosis Date   Allergy    seasonal   Anxiety    Arthritis    Diabetes mellitus    type ii   GERD (gastroesophageal reflux disease)    Hiatal hernia    Hyperlipidemia    Hypertension    Irritable bowel syndrome    Obesity    Sinus infection 08/14/2017   Symptoms resolved after antiobiotic course   Tubular adenoma of colon 05/27/2015   Ulcer     Current Outpatient Medications:    aspirin EC 81 MG tablet, Take 81 mg by mouth daily., Disp: , Rfl:    Blood Glucose Monitoring Suppl (FREESTYLE FREEDOM LITE) w/Device KIT, Test blood sugar BID Dx E11.9, Disp: 1 kit, Rfl: 0   furosemide (LASIX) 40 MG tablet, TAKE 1 TABLET BY MOUTH DAILY., Disp: 90 tablet, Rfl: 3   glucose blood (FREESTYLE LITE) test strip, Use to test blood sugar twice daily as directed, Disp: 200 each, Rfl: 3   ibuprofen (ADVIL) 200 MG tablet, Take 200 mg by mouth every 6  (six) hours as needed., Disp: , Rfl:    Lancets (FREESTYLE) lancets, Test blood sugar BID Dx E11.9, Disp: 200 each, Rfl: 3   metFORMIN (GLUCOPHAGE) 500 MG tablet, TAKE 1 TABLET BY MOUTH 2 TIMES DAILY, Disp: 180 tablet, Rfl: 3   multivitamin-iron-minerals-folic acid (CENTRUM) chewable tablet, Chew 1 tablet by mouth daily., Disp: , Rfl:    omeprazole (PRILOSEC) 20 MG capsule, Take 20 mg by mouth daily., Disp: , Rfl:    Pitavastatin Calcium (LIVALO) 2 MG TABS, TAKE 1 TABLET BY MOUTH AT BEDTIME., Disp: 90 tablet, Rfl: 3   ramipril (ALTACE) 10 MG capsule, Take 1 capsule by mouth daily., Disp: 90 capsule, Rfl: 3   ALPRAZolam (XANAX) 0.5 MG tablet, Take 1 tablet by mouth 2 times daily as needed for anxiety (flight). (Patient not taking: Reported on 07/09/2022), Disp: 20 tablet, Rfl: 0  Current Facility-Administered Medications:    methylPREDNISolone acetate (DEPO-MEDROL) injection 80 mg, 80 mg, Intramuscular, Once, [name], DO Social History   Socioeconomic History   Marital status: Married    Spouse name: Not on file   Number of children: Not on file   Years of education: Not on file   Highest education level: Not on file  Occupational  History   Not on file  Tobacco Use   Smoking status: Never   Smokeless tobacco: Never   Tobacco comments:    AS A Teenager  Vaping Use   Vaping Use: Never used  Substance and Sexual Activity   Alcohol use: Yes    Alcohol/week: 0.0 standard drinks of alcohol    Comment:  occasional wine   Drug use: No   Sexual activity: Not on file  Other Topics Concern   Not on file  Social History Narrative   Not on file   Social Determinants of Health   Financial Resource Strain: Not on file  Food Insecurity: Not on file  Transportation Needs: Not on file  Physical Activity: Not on file  Stress: Not on file  Social Connections: Not on file  Intimate Partner Violence: Not on file   Family History  Problem Relation Age of Onset   Diabetes Mother     Kidney disease Mother    Colitis Father    Colon polyps Father    Heart disease Father    Irritable bowel syndrome Father    Squamous cell carcinoma Father    Other Other        corkscrew esophagus- father, sister   Crohn's disease Sister    Colitis Sister    Colon polyps Sister    Ulcerative colitis Sister    Diabetes Maternal Aunt    Diabetes Paternal Aunt    Diabetes Maternal Grandmother    Diabetes Paternal Grandmother    Heart disease Paternal Grandmother    Colitis Paternal Grandfather    Colon cancer Neg Hx    Esophageal cancer Neg Hx    Pancreatic cancer Neg Hx    Rectal cancer Neg Hx    Stomach cancer Neg Hx     Objective: Office vital signs reviewed. BP 126/72   Pulse 78   Temp 97.9 F (36.6 C)   Ht _0  (1.702 m)   Wt 222 lb (100.7 kg)   LMP 12/26/2014 (Approximate) Comment: 2-3 days  SpO2 96%   BMI 34.77 kg/m   Physical Examination:  General: Awake, alert, well nourished, uncomfortable appearing MSK: Mild soft tissue swelling.  No erythema or warmth.  She has inability to fully extend the left knee and certainly cannot flex past about 35 degrees without pain.  She has no tenderness to palpation to the patella, patellar tendon, lateral joint line but she does have tenderness to the medial joint line.  I did not perform Thessaly  Assessment/ Plan: 61 y.o. female   Acute pain of left knee - Plan: methylPREDNISolone acetate (DEPO-MEDROL) injection 80 mg, predniSONE (STERAPRED UNI-PAK 21 TAB) 10 MG (21) TBPK tablet  Depo-Medrol injected intramuscularly and have given her prednisone Dosepak to have on hand.  We discussed continued icing of the knee.  I will CC her orthopedist as Juluis Rainier but anticipate she will need an appoint with them soon as this is a likely progression of her medial meniscal tear  No orders of the defined types were placed in this encounter.  Meds ordered this encounter  Medications   methylPREDNISolone acetate (DEPO-MEDROL) injection 80  mg     [name], DO [place] [phone]

## 2022-08-18 NOTE — Patient Instructions (Signed)
Meniscus Tear  A meniscus tear is a knee injury that happens when a piece of the meniscus is torn. The meniscus is a thick, rubbery, wedge-shaped piece of cartilage in the knee. Each knee has two menisci sitting between the upper bone (femur) and lower bone (tibia) that form the knee joint. Each meniscus acts as a shock absorber for the knee. A torn meniscus is a common knee injury, ranging from mild to severe. Surgery may be needed to repair a severe tear. What are the causes? This condition may be caused by kneeling, squatting, twisting, or pivoting movements. Sports-related injuries are the most common cause, often resulting from: Running and stopping suddenly. Changing direction. Being tackled or knocked off your feet. Lifting or carrying heavy weights. As people get older, their menisci get thinner and weaker. Tears can happen more easily in older people, for example, when climbing stairs. What increases the risk? You are more likely to develop this condition if you: Play contact sports. Have a job that requires kneeling or squatting. Are female. Are over 35 years old. What are the signs or symptoms? Symptoms of this condition include: Knee pain, especially at the side of the knee joint. You may feel pain immediately after injury, or hear a pop and feel pain later. A feeling that your knee is clicking, catching, locking, or giving way (weakness, instability). Not being able to fully bend or extend your knee. Bruising or swelling in your knee. How is this diagnosed? This condition may be diagnosed based on your symptoms and a physical exam. You may also have tests, such as: X-rays. MRI. Arthroscopy. This is a procedure to look inside your knee with a narrow surgical telescope. You may be referred to a knee specialist (orthopedic surgeon). How is this treated? Treatment for this injury depends on the severity of the tear. Treatment for a mild tear may include: Rest. Medicine to  reduce pain and swelling, usually a nonsteroidal anti-inflammatory drug (NSAID), like ibuprofen. A knee brace, sleeve, or wrap. Using crutches or a walker to keep weight off your knee and help with walking. Exercises to strengthen your knee (physical therapy). You may need surgery if you have a severe tear or if other treatments fail. Follow these instructions at home: If you have a brace, sleeve, or wrap: Wear it, as told by your health care provider. Remove it, only as told by your health care provider. Loosen the brace, sleeve, or wrap if your toes tingle, become numb, or turn cold and blue. Keep the brace, sleeve, or wrap clean. If the brace, sleeve, or wrap is not waterproof: Do not let it get wet. Cover it with a watertight covering when you take a bath or shower. Managing pain, stiffness, and swelling  Take over-the-counter and prescription medicines only as told by your health care provider. If directed, put ice on your knee. To do this: If you have a removable brace, sleeve, or wrap, remove it as told by your health care provider. Put ice in a plastic bag. Place a towel between your skin and the bag. Leave the ice on for 20 minutes, 2-3 times per day. Remove the ice if your skin turns bright red. This is very important. If you cannot feel pain, heat, or cold, you have a greater risk of damage to the area. Move your toes often to reduce stiffness and swelling. Raise (elevate) the injured area above the level of your heart while you are sitting or lying down. Activity Do not  use the injured limb to support your body weight until your health care provider says that you can. Use crutches or a walker as told by your health care provider. Return to your normal activities as told by your health care provider. Ask your health care provider what activities are safe for you. Perform range-of-motion exercises only as told by your health care provider. Begin doing exercises to strengthen  your knee and leg muscles only as told by your health care provider. After you recover, your health care provider may recommend these exercises to help prevent another injury. General instructions Use a knee brace, sleeve, or wrap as told by your health care provider. Ask your health care provider when it is safe to drive if you have a brace, sleeve, or wrap on your knee. Do not use any products that contain nicotine or tobacco, such as cigarettes, e-cigarettes, and chewing tobacco. If you need help quitting, ask your health care provider. Ask your health care provider if the medicine prescribed to you: Requires you to avoid driving or using heavy machinery. Can cause constipation. You may need to take these actions to prevent or treat constipation: Drink enough fluid to keep your urine pale yellow. Take over-the-counter or prescription medicines. Eat foods that are high in fiber, such as beans, whole grains, and fresh fruits and vegetables. Limit foods that are high in fat and processed sugars, such as fried or sweet foods. Keep all follow-up visits. This is important. Contact a health care provider if: You have a fever. Your knee becomes red, tender, or swollen. Your pain medicine is not controlling your pain. Your symptoms get worse or do not improve after 2 weeks of home care. Summary A meniscus tear is a knee injury that happens when a piece of the meniscus is torn. Treatment for this injury depends on the severity of the tear. You may need surgery if you have a severe tear or if other treatments fail. Rest, ice, and raise (elevate) your injured knee, as told by your health care provider. This will help lessen pain and swelling. Contact a health care provider if you have new symptoms, your symptoms worsen, or they do not improve after 2 weeks of home care. Keep all follow-up visits. This is important. This information is not intended to replace advice given to you by your health care  provider. Make sure you discuss any questions you have with your health care provider. Document Revised: 01/31/2020 Document Reviewed: 01/31/2020 Elsevier Patient Education  [place].

## 2022-08-31 ENCOUNTER — Other Ambulatory Visit (HOSPITAL_COMMUNITY): Payer: Self-pay

## 2022-09-18 LAB — HM PAP SMEAR

## 2022-10-01 ENCOUNTER — Other Ambulatory Visit: Payer: Self-pay

## 2022-10-01 ENCOUNTER — Ambulatory Visit (INDEPENDENT_AMBULATORY_CARE_PROVIDER_SITE_OTHER): Payer: No Typology Code available for payment source | Admitting: Family Medicine

## 2022-10-01 ENCOUNTER — Ambulatory Visit (INDEPENDENT_AMBULATORY_CARE_PROVIDER_SITE_OTHER): Payer: No Typology Code available for payment source

## 2022-10-01 ENCOUNTER — Encounter: Payer: Self-pay | Admitting: Family Medicine

## 2022-10-01 VITALS — BP 122/72 | HR 80 | Temp 97.7°F | Resp 20 | Ht 67.0 in | Wt 218.0 lb

## 2022-10-01 DIAGNOSIS — E119 Type 2 diabetes mellitus without complications: Secondary | ICD-10-CM | POA: Diagnosis not present

## 2022-10-01 DIAGNOSIS — M8588 Other specified disorders of bone density and structure, other site: Secondary | ICD-10-CM

## 2022-10-01 DIAGNOSIS — Z780 Asymptomatic menopausal state: Secondary | ICD-10-CM

## 2022-10-01 DIAGNOSIS — E1159 Type 2 diabetes mellitus with other circulatory complications: Secondary | ICD-10-CM

## 2022-10-01 DIAGNOSIS — I152 Hypertension secondary to endocrine disorders: Secondary | ICD-10-CM

## 2022-10-01 DIAGNOSIS — E1169 Type 2 diabetes mellitus with other specified complication: Secondary | ICD-10-CM | POA: Diagnosis not present

## 2022-10-01 DIAGNOSIS — E785 Hyperlipidemia, unspecified: Secondary | ICD-10-CM

## 2022-10-01 LAB — CMP14+EGFR
ALT: 17 IU/L (ref 0–32)
AST: 14 IU/L (ref 0–40)
Albumin/Globulin Ratio: 2 (ref 1.2–2.2)
Albumin: 4.3 g/dL (ref 3.8–4.9)
Alkaline Phosphatase: 74 IU/L (ref 44–121)
BUN/Creatinine Ratio: 14 (ref 12–28)
BUN: 12 mg/dL (ref 8–27)
Bilirubin Total: 0.3 mg/dL (ref 0.0–1.2)
CO2: 27 mmol/L (ref 20–29)
Calcium: 9.5 mg/dL (ref 8.7–10.3)
Chloride: 102 mmol/L (ref 96–106)
Creatinine, Ser: 0.88 mg/dL (ref 0.57–1.00)
Globulin, Total: 2.2 g/dL (ref 1.5–4.5)
Glucose: 114 mg/dL — ABNORMAL HIGH (ref 70–99)
Potassium: 4 mmol/L (ref 3.5–5.2)
Sodium: 143 mmol/L (ref 134–144)
Total Protein: 6.5 g/dL (ref 6.0–8.5)
eGFR: 75 mL/min/{1.73_m2} (ref >59)

## 2022-10-01 LAB — LIPID PANEL
Chol/HDL Ratio: 3.1 ratio (ref 0.0–4.4)
Cholesterol, Total: 147 mg/dL (ref 100–199)
HDL: 47 mg/dL (ref >39)
LDL Chol Calc (NIH): 74 mg/dL (ref 0–99)
Triglycerides: 154 mg/dL — ABNORMAL HIGH (ref 0–149)
VLDL Cholesterol Cal: 26 mg/dL (ref 5–40)

## 2022-10-01 LAB — CBC WITH DIFFERENTIAL/PLATELET
Basophils Absolute: 0.1 10*3/uL (ref 0.0–0.2)
Basos: 1 %
EOS (ABSOLUTE): 0.2 10*3/uL (ref 0.0–0.4)
Eos: 3 %
Hematocrit: 36.2 % (ref 34.0–46.6)
Hemoglobin: 12.2 g/dL (ref 11.1–15.9)
Immature Grans (Abs): 0 10*3/uL (ref 0.0–0.1)
Immature Granulocytes: 0 %
Lymphocytes Absolute: 2 10*3/uL (ref 0.7–3.1)
Lymphs: 26 %
MCH: 29.2 pg (ref 26.6–33.0)
MCHC: 33.7 g/dL (ref 31.5–35.7)
MCV: 87 fL (ref 79–97)
Monocytes Absolute: 0.4 10*3/uL (ref 0.1–0.9)
Monocytes: 5 %
Neutrophils Absolute: 5.2 10*3/uL (ref 1.4–7.0)
Neutrophils: 65 %
Platelets: 295 10*3/uL (ref 150–450)
RBC: 4.18 x10E6/uL (ref 3.77–5.28)
RDW: 12.7 % (ref 11.7–15.4)
WBC: 8 10*3/uL (ref 3.4–10.8)

## 2022-10-01 LAB — BAYER DCA HB A1C WAIVED: HB A1C (BAYER DCA - WAIVED): 6.6 % — ABNORMAL HIGH (ref 4.8–5.6)

## 2022-10-01 NOTE — Progress Notes (Signed)
Subjective: CC:DM PCP: [name], DO [id number] [name] is a 61 y.o. female presenting to clinic today for:  1. Type 2 Diabetes with hypertension, hyperlipidemia:  Patient admits that she is not walking as much as she had been compared to our last visit.  She is compliant with her metformin, Livalo, Altace.  She reports some stress related to her husband's illness this year.  She worries about her vision after having had trauma to that right I would request that we take a look at her pupillary reflex today.  Last eye exam: needs Last foot exam: UTD Last A1c:  Lab Results  Component Value Date   HGBA1C 6.4 (H) 12/17/2021   Nephropathy screen indicated?: needs Last flu, zoster and/or pneumovax:  Immunization History  Administered Date(s) Administered   Influenza Split 07/17/2013   Influenza,inj,Quad PF,6+ Mos 07/10/2015, 07/05/2016, 06/30/2017, 07/12/2018, 07/23/2019, 07/04/2021   Influenza-Unspecified 07/08/2014, 07/11/2020   Moderna Sars-Covid-2 Vaccination 01/29/2020, 02/25/2020   Pneumococcal Conjugate-13 11/30/2017   Pneumococcal Polysaccharide-23 11/05/2020    ROS: No chest pain, shortness of breath, polydipsia or polyuria reported.   ROS: Per HPI  Allergies  Allergen Reactions   Erythromycin Other (See Comments)    Abdominal pain   Codeine Other (See Comments)    Hyper    Sulfa Antibiotics Hives   Tetracycline Rash   Past Medical History:  Diagnosis Date   Allergy    seasonal   Anxiety    Arthritis    Diabetes mellitus    type ii   GERD (gastroesophageal reflux disease)    Hiatal hernia    Hyperlipidemia    Hypertension    Irritable bowel syndrome    Obesity    Sinus infection 08/14/2017   Symptoms resolved after antiobiotic course   Tubular adenoma of colon 05/27/2015   Ulcer     Current Outpatient Medications:    ALPRAZolam (XANAX) 0.5 MG tablet, Take 1 tablet by mouth 2 times daily as needed for anxiety (flight). (Patient not taking:  Reported on 07/09/2022), Disp: 20 tablet, Rfl: 0   aspirin EC 81 MG tablet, Take 81 mg by mouth daily., Disp: , Rfl:    Blood Glucose Monitoring Suppl (FREESTYLE FREEDOM LITE) w/Device KIT, Test blood sugar BID Dx E11.9, Disp: 1 kit, Rfl: 0   furosemide (LASIX) 40 MG tablet, TAKE 1 TABLET BY MOUTH DAILY., Disp: 90 tablet, Rfl: 3   glucose blood (FREESTYLE LITE) test strip, Use to test blood sugar twice daily as directed, Disp: 200 each, Rfl: 3   ibuprofen (ADVIL) 200 MG tablet, Take 200 mg by mouth every 6 (six) hours as needed., Disp: , Rfl:    Lancets (FREESTYLE) lancets, Test blood sugar BID Dx E11.9, Disp: 200 each, Rfl: 3   metFORMIN (GLUCOPHAGE) 500 MG tablet, TAKE 1 TABLET BY MOUTH 2 TIMES DAILY, Disp: 180 tablet, Rfl: 3   multivitamin-iron-minerals-folic acid (CENTRUM) chewable tablet, Chew 1 tablet by mouth daily., Disp: , Rfl:    omeprazole (PRILOSEC) 20 MG capsule, Take 20 mg by mouth daily., Disp: , Rfl:    Pitavastatin Calcium (LIVALO) 2 MG TABS, TAKE 1 TABLET BY MOUTH AT BEDTIME., Disp: 90 tablet, Rfl: 3   predniSONE (STERAPRED UNI-PAK 21 TAB) 10 MG (21) TBPK tablet, As directed x 6 days, Disp: 21 tablet, Rfl: 0   ramipril (ALTACE) 10 MG capsule, Take 1 capsule by mouth daily., Disp: 90 capsule, Rfl: 3 Social History   Socioeconomic History   Marital status: Married    Spouse  name: Not on file   Number of children: Not on file   Years of education: Not on file   Highest education level: Not on file  Occupational History   Not on file  Tobacco Use   Smoking status: Never   Smokeless tobacco: Never   Tobacco comments:    AS A Teenager  Vaping Use   Vaping Use: Never used  Substance and Sexual Activity   Alcohol use: Yes    Alcohol/week: 0.0 standard drinks of alcohol    Comment:  occasional wine   Drug use: No   Sexual activity: Not on file  Other Topics Concern   Not on file  Social History Narrative   Not on file   Social Determinants of Health   Financial  Resource Strain: Not on file  Food Insecurity: Not on file  Transportation Needs: Not on file  Physical Activity: Not on file  Stress: Not on file  Social Connections: Not on file  Intimate Partner Violence: Not on file   Family History  Problem Relation Age of Onset   Diabetes Mother    Kidney disease Mother    Colitis Father    Colon polyps Father    Heart disease Father    Irritable bowel syndrome Father    Squamous cell carcinoma Father    Other Other        corkscrew esophagus- father, sister   Crohn's disease Sister    Colitis Sister    Colon polyps Sister    Ulcerative colitis Sister    Diabetes Maternal Aunt    Diabetes Paternal Aunt    Diabetes Maternal Grandmother    Diabetes Paternal Grandmother    Heart disease Paternal Grandmother    Colitis Paternal Grandfather    Colon cancer Neg Hx    Esophageal cancer Neg Hx    Pancreatic cancer Neg Hx    Rectal cancer Neg Hx    Stomach cancer Neg Hx     Objective: Office vital signs reviewed. BP 122/72   Pulse 80   Temp 97.7 F (36.5 C) (Temporal)   Resp 20   Ht _0  (1.702 m)   Wt 218 lb (98.9 kg)   LMP 12/26/2014 (Approximate) Comment: 2-3 days  SpO2 99%   BMI 34.14 kg/m   Physical Examination:  General: Awake, alert, well nourished, No acute distress HEENT: sclera white, MMM.  See pupillary exam below.  She does have some dulled reflex of bilateral TMs.  They are both intact. Cardio: regular rate and rhythm, S1S2 heard, no murmurs appreciated Pulm: clear to auscultation bilaterally, no wheezes, rhonchi or rales; normal work of breathing on room air Skin: dry; intact; no rashes or lesions Neuro: no tremor.  Does have sluggish pupillary reflex on the right compared to the left with about a 1 mm reduction in ability to contract.  Assessment/ Plan: 61 y.o. female   Controlled type 2 diabetes mellitus without complication, without long-term current use of insulin ([place]) - Plan: Bayer DCA Hb A1c Waived,  Microalbumin / creatinine urine ratio  Hypertension associated with diabetes ([place]) - Plan: CBC with Differential/Platelet, CMP14+EGFR  Hyperlipidemia associated with type 2 diabetes mellitus ([place]) - Plan: Lipid panel  Osteopenia of lumbar spine  A1c remains under good control.  No changes.  Agree with increase physical exercise as this is good for her cardiovascular system  Blood pressure well-controlled.  No changes.  Fasting lipid panel collected today.  Continue statin  Check DEXA scan given osteopenia  of the spine.  Orders Placed This Encounter  Procedures   Bayer DCA Hb A1c Waived   CBC with Differential/Platelet   CMP14+EGFR   Lipid panel   Microalbumin / creatinine urine ratio   No orders of the defined types were placed in this encounter.    [name], DO [place] [phone]

## 2022-10-02 LAB — MICROALBUMIN / CREATININE URINE RATIO
Creatinine, Urine: 63.8 mg/dL
Microalb/Creat Ratio: 5 mg/g creat (ref 0–29)
Microalbumin, Urine: 3.3 ug/mL

## 2022-10-08 ENCOUNTER — Other Ambulatory Visit (HOSPITAL_COMMUNITY): Payer: Self-pay

## 2022-10-18 ENCOUNTER — Encounter: Payer: Self-pay | Admitting: Family Medicine

## 2022-10-22 ENCOUNTER — Other Ambulatory Visit (HOSPITAL_BASED_OUTPATIENT_CLINIC_OR_DEPARTMENT_OTHER): Payer: Self-pay

## 2022-10-22 ENCOUNTER — Other Ambulatory Visit (HOSPITAL_COMMUNITY): Payer: Self-pay

## 2022-10-26 DIAGNOSIS — H43393 Other vitreous opacities, bilateral: Secondary | ICD-10-CM | POA: Diagnosis not present

## 2022-11-12 ENCOUNTER — Other Ambulatory Visit (HOSPITAL_COMMUNITY): Payer: Self-pay
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
## 2022-11-17 ENCOUNTER — Encounter (INDEPENDENT_AMBULATORY_CARE_PROVIDER_SITE_OTHER): Payer: Self-pay | Admitting: Ophthalmology

## 2022-11-17 ENCOUNTER — Ambulatory Visit (INDEPENDENT_AMBULATORY_CARE_PROVIDER_SITE_OTHER): Payer: 59 | Admitting: Ophthalmology

## 2022-11-17 DIAGNOSIS — H40051 Ocular hypertension, right eye: Secondary | ICD-10-CM

## 2022-11-17 DIAGNOSIS — H25813 Combined forms of age-related cataract, bilateral: Secondary | ICD-10-CM

## 2022-11-17 DIAGNOSIS — H43812 Vitreous degeneration, left eye: Secondary | ICD-10-CM

## 2022-11-17 DIAGNOSIS — H43813 Vitreous degeneration, bilateral: Secondary | ICD-10-CM | POA: Diagnosis not present

## 2022-11-17 DIAGNOSIS — H35033 Hypertensive retinopathy, bilateral: Secondary | ICD-10-CM

## 2022-11-17 DIAGNOSIS — I10 Essential (primary) hypertension: Secondary | ICD-10-CM | POA: Diagnosis not present

## 2022-11-17 DIAGNOSIS — S0591XD Unspecified injury of right eye and orbit, subsequent encounter: Secondary | ICD-10-CM

## 2022-11-17 DIAGNOSIS — H2101 Hyphema, right eye: Secondary | ICD-10-CM

## 2022-11-19 ENCOUNTER — Encounter (INDEPENDENT_AMBULATORY_CARE_PROVIDER_SITE_OTHER): Payer: Self-pay | Admitting: Ophthalmology

## 2022-11-29 ENCOUNTER — Ambulatory Visit: Payer: 59 | Admitting: Family Medicine

## 2022-11-29 ENCOUNTER — Encounter: Payer: Self-pay | Admitting: Family Medicine

## 2022-11-29 ENCOUNTER — Other Ambulatory Visit (HOSPITAL_COMMUNITY): Payer: Self-pay

## 2022-11-29 VITALS — BP 123/64 | HR 80 | Temp 98.6°F | Ht 67.0 in | Wt 225.0 lb

## 2022-11-29 DIAGNOSIS — F40243 Fear of flying: Secondary | ICD-10-CM | POA: Diagnosis not present

## 2022-11-29 DIAGNOSIS — M7071 Other bursitis of hip, right hip: Secondary | ICD-10-CM

## 2022-11-29 DIAGNOSIS — H43813 Vitreous degeneration, bilateral: Secondary | ICD-10-CM | POA: Insufficient documentation

## 2022-11-29 DIAGNOSIS — F418 Other specified anxiety disorders: Secondary | ICD-10-CM

## 2022-11-29 MED ORDER — PREDNISONE 20 MG PO TABS
ORAL_TABLET | ORAL | 0 refills | Status: DC
  Filled 2022-11-29: qty 10, fill #0
  Filled 2022-11-29: qty 10, 5d supply, fill #0

## 2022-11-29 MED ORDER — ALPRAZOLAM 0.5 MG PO TABS
0.5000 mg | ORAL_TABLET | Freq: Two times a day (BID) | ORAL | 0 refills | Status: AC | PRN
  Filled 2022-11-29 (×2): qty 20, 10d supply, fill #0

## 2022-11-29 NOTE — Progress Notes (Signed)
Subjective: CC: Flight anxiety PCP: [name], DO [id number] B Afzal is a 62 y.o. female presenting to clinic today for:  Anxiety Patient reports that she will be going on a trip to [place] soon.  This to be the longest flight that she has been on and she worries about having a panic attack while in ER.  She got a prescription for Xanax a few years ago for a different flight and never did have to use it but she worries about this and being a little longer.  That particular medication of course is expired as it several years old.  2.  Right buttock pain Patient reports that she has been having some pain at the right buttock.  She points to the area of the ischial bursa.  She notes is been ongoing for the last several days and that going up stairs seems to make it worse.  She does sit a lot for her job.  Has been using ibuprofen but this has not been helping symptoms.  Also takes baby aspirin daily  3.  Vitreous detachment Patient noted to have bilateral vitreous detachment by her ophthalmologist recently.  She is experiencing some floaters in her vision particularly on the left side and he identified this as the cause.  She notes that will be lifelong but her body should acclimate to it.  She does not report any double vision or loss of vision outside of what she sustained after eye injury   ROS: Per HPI  Allergies  Allergen Reactions   Erythromycin Other (See Comments)    Abdominal pain   Codeine Other (See Comments)    Hyper    Sulfa Antibiotics Hives   Tetracycline Rash   Past Medical History:  Diagnosis Date   Allergy    seasonal   Anxiety    Arthritis    Diabetes mellitus    type ii   GERD (gastroesophageal reflux disease)    Hiatal hernia    Hyperlipidemia    Hypertension    Irritable bowel syndrome    Obesity    Sinus infection 08/14/2017   Symptoms resolved after antiobiotic course   Tubular adenoma of colon 05/27/2015   Ulcer     Current Outpatient  Medications:    aspirin EC 81 MG tablet, Take 81 mg by mouth daily., Disp: , Rfl:    Blood Glucose Monitoring Suppl (FREESTYLE FREEDOM LITE) w/Device KIT, Test blood sugar BID Dx E11.9, Disp: 1 kit, Rfl: 0   furosemide (LASIX) 40 MG tablet, TAKE 1 TABLET BY MOUTH DAILY., Disp: 90 tablet, Rfl: 3   glucose blood (FREESTYLE LITE) test strip, Use to test blood sugar twice daily as directed, Disp: 200 each, Rfl: 3   ibuprofen (ADVIL) 200 MG tablet, Take 200 mg by mouth every 6 (six) hours as needed., Disp: , Rfl:    Lancets (FREESTYLE) lancets, Test blood sugar BID Dx E11.9, Disp: 200 each, Rfl: 3   metFORMIN (GLUCOPHAGE) 500 MG tablet, TAKE 1 TABLET BY MOUTH 2 TIMES DAILY, Disp: 180 tablet, Rfl: 3   multivitamin-iron-minerals-folic acid (CENTRUM) chewable tablet, Chew 1 tablet by mouth daily., Disp: , Rfl:    omeprazole (PRILOSEC) 20 MG capsule, Take 20 mg by mouth daily., Disp: , Rfl:    Pitavastatin Calcium (LIVALO) 2 MG TABS, TAKE 1 TABLET BY MOUTH AT BEDTIME., Disp: 90 tablet, Rfl: 3   predniSONE (DELTASONE) 20 MG tablet, Take 2 tablets by mouth at same time daily for 5 days, Disp: 10  tablet, Rfl: 0   ramipril (ALTACE) 10 MG capsule, Take 1 capsule by mouth daily., Disp: 90 capsule, Rfl: 3   ALPRAZolam (XANAX) 0.5 MG tablet, Take 1 tablet by mouth 2 times daily as needed for anxiety (flight)., Disp: 20 tablet, Rfl: 0 Social History   Socioeconomic History   Marital status: Married    Spouse name: Not on file   Number of children: Not on file   Years of education: Not on file   Highest education level: Not on file  Occupational History   Not on file  Tobacco Use   Smoking status: Never   Smokeless tobacco: Never   Tobacco comments:    AS A Teenager  Vaping Use   Vaping Use: Never used  Substance and Sexual Activity   Alcohol use: Yes    Alcohol/week: 0.0 standard drinks of alcohol    Comment:  occasional wine   Drug use: No   Sexual activity: Not on file  Other Topics Concern    Not on file  Social History Narrative   Not on file   Social Determinants of Health   Financial Resource Strain: Not on file  Food Insecurity: Not on file  Transportation Needs: Not on file  Physical Activity: Not on file  Stress: Not on file  Social Connections: Not on file  Intimate Partner Violence: Not on file   Family History  Problem Relation Age of Onset   Diabetes Mother    Kidney disease Mother    Colitis Father    Colon polyps Father    Heart disease Father    Irritable bowel syndrome Father    Squamous cell carcinoma Father    Other Other        corkscrew esophagus- father, sister   Crohn's disease Sister    Colitis Sister    Colon polyps Sister    Ulcerative colitis Sister    Diabetes Maternal Aunt    Diabetes Paternal Aunt    Diabetes Maternal Grandmother    Diabetes Paternal Grandmother    Heart disease Paternal Grandmother    Colitis Paternal Grandfather    Colon cancer Neg Hx    Esophageal cancer Neg Hx    Pancreatic cancer Neg Hx    Rectal cancer Neg Hx    Stomach cancer Neg Hx     Objective: Office vital signs reviewed. BP 123/64   Pulse 80   Temp 98.6 F (37 C)   Ht '5\' 7"'$  (1.702 m)   Wt 225 lb (102.1 kg)   LMP 12/26/2014 (Approximate) Comment: 2-3 days  SpO2 100%   BMI 35.24 kg/m   Physical Examination:  General: Awake, alert, well nourished, No acute distress HEENT:  sclera white, MMM Cardio: regular rate and rhythm, S1S2 heard, no murmurs appreciated Pulm: clear to auscultation bilaterally, no wheezes, rhonchi or rales; normal work of breathing on room air MSK: Mildly antalgic gait.  Exquisitely tender to touch on the area of the right ischial bursa.  She has pain with resisted flexion of the lower extremity. Psych: Mood stable, speech normal, affect appropriate.  Very pleasant and interactive  Assessment/ Plan: 62 y.o. female   Ischial bursitis of right side - Plan: predniSONE (DELTASONE) 20 MG tablet  Anxiety with flying -  Plan: ALPRAZolam (XANAX) 0.5 MG tablet  Situational anxiety - Plan: ALPRAZolam (XANAX) 0.5 MG tablet  Bilateral vitreous detachment  Clinically consistent with ischial bursitis.  I gave her home physical therapy exercises for this.  Have given her  a burst of prednisone to have on hand while she is traveling but hopefully symptoms will get better with physical therapy.  Xanax renewed for anxiety with flying.  Advised to use only if needed.  Caution sedation.  National narcotic database reviewed and there were no red flags.  This is not a chronic prescription for the patient so UDS and CSC were not updated but if needing more frequently we will certainly need to pursue this.  Patient with known bilateral vitreous detachment.  She is under the care of ophthalmology for this.  Patient has been baseline  No orders of the defined types were placed in this encounter.  Meds ordered this encounter  Medications   ALPRAZolam (XANAX) 0.5 MG tablet    Sig: Take 1 tablet by mouth 2 times daily as needed for anxiety (flight).    Dispense:  20 tablet    Refill:  0   predniSONE (DELTASONE) 20 MG tablet    Sig: Take 2 tablets by mouth at same time daily for 5 days    Dispense:  10 tablet    Refill:  0     Milisa Kimbell Windell Moulding, DO [place] [phone]

## 2022-12-21 ENCOUNTER — Encounter: Payer: Self-pay | Admitting: Family Medicine

## 2023-01-17 ENCOUNTER — Other Ambulatory Visit (HOSPITAL_COMMUNITY): Payer: Self-pay

## 2023-01-17 ENCOUNTER — Other Ambulatory Visit: Payer: Self-pay | Admitting: Family Medicine

## 2023-01-17 DIAGNOSIS — E1169 Type 2 diabetes mellitus with other specified complication: Secondary | ICD-10-CM

## 2023-01-17 DIAGNOSIS — E1159 Type 2 diabetes mellitus with other circulatory complications: Secondary | ICD-10-CM

## 2023-01-17 DIAGNOSIS — E119 Type 2 diabetes mellitus without complications: Secondary | ICD-10-CM

## 2023-01-17 MED ORDER — METFORMIN HCL 500 MG PO TABS
500.0000 mg | ORAL_TABLET | Freq: Two times a day (BID) | ORAL | 0 refills | Status: DC
  Filled 2023-01-17: qty 180, fill #0
  Filled 2023-02-21: qty 180, 90d supply, fill #0

## 2023-01-17 MED ORDER — PITAVASTATIN CALCIUM 2 MG PO TABS
1.0000 | ORAL_TABLET | Freq: Every day | ORAL | 0 refills | Status: DC
  Filled 2023-01-17 – 2023-02-21 (×2): qty 90, 90d supply, fill #0

## 2023-01-17 MED ORDER — RAMIPRIL 10 MG PO CAPS
10.0000 mg | ORAL_CAPSULE | Freq: Every day | ORAL | 0 refills | Status: DC
  Filled 2023-01-17 – 2023-01-24 (×2): qty 90, 90d supply, fill #0

## 2023-01-17 MED ORDER — FUROSEMIDE 40 MG PO TABS
ORAL_TABLET | Freq: Every day | ORAL | 0 refills | Status: DC
  Filled 2023-01-17: qty 90, 90d supply, fill #0

## 2023-01-18 ENCOUNTER — Other Ambulatory Visit: Payer: Self-pay

## 2023-01-18 ENCOUNTER — Other Ambulatory Visit (HOSPITAL_COMMUNITY): Payer: Self-pay

## 2023-01-24 ENCOUNTER — Other Ambulatory Visit (HOSPITAL_COMMUNITY): Payer: Self-pay

## 2023-01-24 ENCOUNTER — Other Ambulatory Visit: Payer: Self-pay

## 2023-01-24 ENCOUNTER — Other Ambulatory Visit: Payer: Self-pay | Admitting: Family Medicine

## 2023-01-24 DIAGNOSIS — E1159 Type 2 diabetes mellitus with other circulatory complications: Secondary | ICD-10-CM

## 2023-01-24 MED ORDER — RAMIPRIL 10 MG PO CAPS
20.0000 mg | ORAL_CAPSULE | Freq: Every day | ORAL | 0 refills | Status: DC
  Filled 2023-01-24 – 2023-01-25 (×2): qty 180, 90d supply, fill #0

## 2023-01-25 ENCOUNTER — Other Ambulatory Visit (HOSPITAL_COMMUNITY): Payer: Self-pay

## 2023-01-27 ENCOUNTER — Other Ambulatory Visit: Payer: Self-pay

## 2023-01-28 ENCOUNTER — Other Ambulatory Visit (HOSPITAL_COMMUNITY): Payer: Self-pay

## 2023-02-16 ENCOUNTER — Other Ambulatory Visit: Payer: Self-pay | Admitting: Nurse Practitioner

## 2023-02-16 ENCOUNTER — Other Ambulatory Visit (HOSPITAL_COMMUNITY): Payer: Self-pay

## 2023-02-16 ENCOUNTER — Other Ambulatory Visit: Payer: Self-pay

## 2023-02-16 MED ORDER — FLUTICASONE PROPIONATE 50 MCG/ACT NA SUSP
2.0000 | Freq: Every day | NASAL | 1 refills | Status: DC
  Filled 2023-02-16: qty 48, 90d supply, fill #0

## 2023-02-21 ENCOUNTER — Other Ambulatory Visit (HOSPITAL_COMMUNITY): Payer: Self-pay

## 2023-02-22 ENCOUNTER — Other Ambulatory Visit (HOSPITAL_COMMUNITY): Payer: Self-pay

## 2023-03-21 ENCOUNTER — Ambulatory Visit (INDEPENDENT_AMBULATORY_CARE_PROVIDER_SITE_OTHER): Payer: 59 | Admitting: Family Medicine

## 2023-03-21 ENCOUNTER — Encounter: Payer: Self-pay | Admitting: Family Medicine

## 2023-03-21 VITALS — BP 110/66 | HR 83 | Temp 98.8°F | Ht 67.0 in | Wt 224.0 lb

## 2023-03-21 DIAGNOSIS — M8588 Other specified disorders of bone density and structure, other site: Secondary | ICD-10-CM | POA: Diagnosis not present

## 2023-03-21 DIAGNOSIS — I152 Hypertension secondary to endocrine disorders: Secondary | ICD-10-CM

## 2023-03-21 DIAGNOSIS — E119 Type 2 diabetes mellitus without complications: Secondary | ICD-10-CM

## 2023-03-21 DIAGNOSIS — E559 Vitamin D deficiency, unspecified: Secondary | ICD-10-CM

## 2023-03-21 DIAGNOSIS — Z0001 Encounter for general adult medical examination with abnormal findings: Secondary | ICD-10-CM

## 2023-03-21 DIAGNOSIS — Z114 Encounter for screening for human immunodeficiency virus [HIV]: Secondary | ICD-10-CM | POA: Diagnosis not present

## 2023-03-21 DIAGNOSIS — E785 Hyperlipidemia, unspecified: Secondary | ICD-10-CM | POA: Diagnosis not present

## 2023-03-21 DIAGNOSIS — Z7984 Long term (current) use of oral hypoglycemic drugs: Secondary | ICD-10-CM

## 2023-03-21 DIAGNOSIS — Z0000 Encounter for general adult medical examination without abnormal findings: Secondary | ICD-10-CM

## 2023-03-21 DIAGNOSIS — E1169 Type 2 diabetes mellitus with other specified complication: Secondary | ICD-10-CM

## 2023-03-21 DIAGNOSIS — E1159 Type 2 diabetes mellitus with other circulatory complications: Secondary | ICD-10-CM

## 2023-03-21 DIAGNOSIS — M7661 Achilles tendinitis, right leg: Secondary | ICD-10-CM

## 2023-03-21 NOTE — Patient Instructions (Signed)
Achilles Tendinitis  Achilles tendinitis is inflammation of the tough, cord-like band that connects the lower leg muscles to the heel bone (Achilles tendon). This is often caused by using the tendon and ankle joint too much. In most cases, Achilles tendinitis gets better over time with treatment and home care. It can take weeks or months to fully heal. What are the causes? This condition may be caused by: A sudden increase in exercise or activity, such as running. Doing the same exercises or activities, such as jumping, over and over. Not warming up your calf muscles before you exercise. Exercising in shoes that are worn out or not made for exercise. Having arthritis or a bone growth (spur) on the back of your heel. This can rub against the tendon and hurt it. Age-related wear and tear. Tendons become less flexible with age and are more likely to be injured. What are the signs or symptoms? Common symptoms of this condition include: Pain in your Achilles tendon or in the back of your leg, just above your heel. The pain may get worse when you exercise. Stiffness or soreness in the back of your leg. You may feel it most often in the morning. Swelling of the skin over the Achilles tendon. Thickening of the tendon. Trouble standing on tiptoe. How is this diagnosed? This condition is diagnosed based on your symptoms and a physical exam. You may also have tests, such as: X-rays. MRI. Ultrasound. How is this treated? The goal of treatment is to relieve symptoms and help your injury heal. You may need to: Decrease or stop activities that caused the tendinitis. You may be told to switch to low-impact exercises like biking or swimming. Ice the injured area. Do physical therapy. This may include strengthening and stretching exercises. Take NSAIDs, such as ibuprofen. These can help with pain and swelling. Use supportive shoes, wraps, heel lifts, or a walking boot (air cast). Have surgery. This may  be done if your symptoms do not get better with other treatments. Use high-energy waves to start the healing process (extracorporeal shock wave therapy). This is rare. Get an injection of medicines that help with inflammation (corticosteroids). This is rare. Follow these instructions at home: If you have an air cast: Wear the air cast as told by your health care provider. Remove it only as told by your provider. Check the skin around the air cast every day. Tell your provider about any concerns. Loosen the air cast if your toes tingle, become numb, or turn cold and blue. Keep the air cast clean. If the air cast is not waterproof: Do not let it get wet. Cover it with a watertight covering when you take a bath or shower. Managing pain, stiffness, and swelling  If told, put ice on the injured area. If you have a removable air cast, remove it as told by your provider. Put ice in a plastic bag. Place a towel between your skin and the bag. Leave the ice on for 20 minutes, 2-3 times a day. If your skin turns bright red, remove the ice right away to prevent skin damage. The risk of damage is higher if you cannot feel pain, heat, or cold. Move your toes often to reduce stiffness and swelling. Raise (elevate) your foot above the level of your heart while you are sitting or lying down. Activity Do not do activities that cause pain. Ask your provider when it is safe to drive if you have an air cast on your foot. If  you go to physical therapy, do exercises as told by your provider or therapist. Return to your normal activities as told by your provider. Ask your provider what activities are safe for you. General instructions Take over-the-counter and prescription medicines only as told by your provider. If told, wrap your foot with an elastic bandage or other wrap. This can help to keep your tendon from moving too much while it heals. Your provider will show you how to wrap your foot. Wear supportive  shoes or heel lifts only as told by your provider. Contact a health care provider if: Your symptoms get worse. Your pain does not get better with medicine. You have new symptoms that you cannot explain. You have warmth and swelling in your foot. You have a fever. Get help right away if: You hear a sudden popping sound in your Achilles tendon and then have severe pain. You cannot move your toes or foot. You cannot put any weight on your foot. Your foot or toes become numb and look white or blue even after you loosen your bandage or air cast. This information is not intended to replace advice given to you by your health care provider. Make sure you discuss any questions you have with your health care provider. Document Revised: 06/11/2022 Document Reviewed: 06/11/2022 Elsevier Patient Education  2024 [employer].

## 2023-03-21 NOTE — Progress Notes (Signed)
[name] is a 62 y.o. female presents to office today for annual physical exam examination.    Concerns today include: 1. Type 2 Diabetes with hypertension, hyperlipidemia:  She reports that she has been doing well.  She has been under a bit more stress at work but overall has no major complaints.  She is compliant with all medications.  Last eye exam: Done in December with Dr. Hubbard Robinson Last foot exam: needs Last A1c:  Lab Results  Component Value Date   HGBA1C 6.6 (H) 10/01/2022   Nephropathy screen indicated?: UTD Last flu, zoster and/or pneumovax:  Immunization History  Administered Date(s) Administered   Influenza Split 07/17/2013   Influenza,inj,Quad PF,6+ Mos 07/10/2015, 07/05/2016, 06/30/2017, 07/12/2018, 07/23/2019, 07/04/2021   Influenza-Unspecified 07/08/2014, 07/11/2020   Moderna Sars-Covid-2 Vaccination 01/29/2020, 02/25/2020   Pneumococcal Conjugate-13 11/30/2017   Pneumococcal Polysaccharide-23 11/05/2020    ROS: Denies dizziness, LOC, polyuria, polydipsia, unintended weight loss/gain, foot ulcerations, numbness or tingling in extremities, shortness of breath or chest pain.   Occupation: Production designer, theatre/television/film for billing at [employer], Marital status: married, Substance use: None Diet: Typical American, Exercise: Walks but has been having some pain at the posterior right heel.  This seems to be worse with stepping down.  No preceding injury but she did notice it after she was walking quite a bit in [place] Last colonoscopy: UTD Last mammogram: UTD Last pap smear: UTD Refills needed today: None Immunizations needed: Immunization History  Administered Date(s) Administered   Influenza Split 07/17/2013   Influenza,inj,Quad PF,6+ Mos 07/10/2015, 07/05/2016, 06/30/2017, 07/12/2018, 07/23/2019, 07/04/2021   Influenza-Unspecified 07/08/2014, 07/11/2020   Moderna Sars-Covid-2 Vaccination 01/29/2020, 02/25/2020   Pneumococcal Conjugate-13 11/30/2017   Pneumococcal  Polysaccharide-23 11/05/2020     Past Medical History:  Diagnosis Date   Allergy    seasonal   Anxiety    Arthritis    Diabetes mellitus    type ii   GERD (gastroesophageal reflux disease)    Hiatal hernia    Hyperlipidemia    Hypertension    Irritable bowel syndrome    Obesity    Sinus infection 08/14/2017   Symptoms resolved after antiobiotic course   Tubular adenoma of colon 05/27/2015   Ulcer    Social History   Socioeconomic History   Marital status: Married    Spouse name: Not on file   Number of children: Not on file   Years of education: Not on file   Highest education level: Not on file  Occupational History   Not on file  Tobacco Use   Smoking status: Never   Smokeless tobacco: Never   Tobacco comments:    AS A Teenager  Vaping Use   Vaping Use: Never used  Substance and Sexual Activity   Alcohol use: Yes    Alcohol/week: 0.0 standard drinks of alcohol    Comment:  occasional wine   Drug use: No   Sexual activity: Not on file  Other Topics Concern   Not on file  Social History Narrative   Not on file   Social Determinants of Health   Financial Resource Strain: Not on file  Food Insecurity: Not on file  Transportation Needs: Not on file  Physical Activity: Not on file  Stress: Not on file  Social Connections: Not on file  Intimate Partner Violence: Not on file   Past Surgical History:  Procedure Laterality Date   CHOLECYSTECTOMY  2009   COLONOSCOPY     POLYPECTOMY     Family History  Problem  Relation Age of Onset   Diabetes Mother    Kidney disease Mother    Colitis Father    Colon polyps Father    Heart disease Father    Irritable bowel syndrome Father    Squamous cell carcinoma Father    Crohn's disease Sister    Colitis Sister    Colon polyps Sister    Ulcerative colitis Sister    Diabetes Maternal Aunt    Diabetes Paternal Aunt    AAA (abdominal aortic aneurysm) Paternal Aunt    Diabetes Maternal Grandmother    Diabetes  Paternal Grandmother    Heart disease Paternal Grandmother    Colitis Paternal Grandfather    Other Other        corkscrew esophagus- father, sister   Colon cancer Neg Hx    Esophageal cancer Neg Hx    Pancreatic cancer Neg Hx    Rectal cancer Neg Hx    Stomach cancer Neg Hx     Current Outpatient Medications:    ALPRAZolam (XANAX) 0.5 MG tablet, Take 1 tablet by mouth 2 times daily as needed for anxiety (flight)., Disp: 20 tablet, Rfl: 0   aspirin EC 81 MG tablet, Take 81 mg by mouth daily., Disp: , Rfl:    Blood Glucose Monitoring Suppl (FREESTYLE FREEDOM LITE) w/Device KIT, Test blood sugar BID Dx E11.9, Disp: 1 kit, Rfl: 0   fluticasone (FLONASE) 50 MCG/ACT nasal spray, Place 2 sprays into both nostrils daily., Disp: 48 g, Rfl: 1   furosemide (LASIX) 40 MG tablet, TAKE 1 TABLET BY MOUTH DAILY., Disp: 90 tablet, Rfl: 0   glucose blood (FREESTYLE LITE) test strip, Use to test blood sugar twice daily as directed, Disp: 200 each, Rfl: 3   ibuprofen (ADVIL) 200 MG tablet, Take 200 mg by mouth every 6 (six) hours as needed., Disp: , Rfl:    Lancets (FREESTYLE) lancets, Test blood sugar BID Dx E11.9, Disp: 200 each, Rfl: 3   metFORMIN (GLUCOPHAGE) 500 MG tablet, Take 1 tablet (500 mg total) by mouth 2 (two) times daily., Disp: 180 tablet, Rfl: 0   multivitamin-iron-minerals-folic acid (CENTRUM) chewable tablet, Chew 1 tablet by mouth daily., Disp: , Rfl:    omeprazole (PRILOSEC) 20 MG capsule, Take 20 mg by mouth daily., Disp: , Rfl:    Pitavastatin Calcium (LIVALO) 2 MG TABS, TAKE 1 TABLET BY MOUTH AT BEDTIME., Disp: 90 tablet, Rfl: 0   ramipril (ALTACE) 10 MG capsule, Take 2 capsules (20 mg total) by mouth daily., Disp: 180 capsule, Rfl: 0  Allergies  Allergen Reactions   Erythromycin Other (See Comments)    Abdominal pain   Codeine Other (See Comments)    Hyper    Sulfa Antibiotics Hives   Tetracycline Rash     ROS: Review of Systems Pertinent items noted in HPI and remainder  of comprehensive ROS otherwise negative.    Physical exam BP 110/66   Pulse 83   Temp 98.8 F (37.1 C)   Ht 5\' 7"  (1.702 m)   Wt 224 lb (101.6 kg)   LMP 12/26/2014 (Approximate) Comment: 2-3 days  SpO2 97%   BMI 35.08 kg/m  General appearance: alert, cooperative, appears stated age, no distress, and moderately obese Head: Normocephalic, without obvious abnormality, atraumatic Eyes: negative findings: lids and lashes normal, conjunctivae and sclerae normal, corneas clear, and pupils equal, round, reactive to light and accomodation Ears: normal TM's and external ear canals both ears Nose: Nares normal. Septum midline. Mucosa normal. No drainage or sinus  tenderness. Throat: lips, mucosa, and tongue normal; teeth and gums normal Neck: no adenopathy, no carotid bruit, supple, symmetrical, trachea midline, and thyroid not enlarged, symmetric, no tenderness/mass/nodules Back: symmetric, no curvature. ROM normal. No CVA tenderness. Lungs: clear to auscultation bilaterally Heart: regular rate and rhythm, S1, S2 normal, no murmur, click, rub or gallop Abdomen: soft, non-tender; bowel sounds normal; no masses,  no organomegaly Extremities: extremities normal, atraumatic, no cyanosis or edema Pulses: 2+ and symmetric Skin: Skin color, texture, turgor normal. No rashes or lesions Lymph nodes: Cervical, supraclavicular, and axillary nodes normal. Neurologic: Grossly normal Psych: Somewhat anxious when talking about work.  Diabetic Foot Exam - Simple   Simple Foot Form Diabetic Foot exam was performed with the following findings: Yes 03/21/2023 10:10 AM  Visual Inspection No deformities, no ulcerations, no other skin breakdown bilaterally: Yes Sensation Testing Intact to touch and monofilament testing bilaterally: Yes Pulse Check Posterior Tibialis and Dorsalis pulse intact bilaterally: Yes Comments Tenderness palpation noted along the insertion site of the Achilles tendon on the right with  mild soft tissue inflammation     Assessment/ Plan: Bjorn Loser B Brucks here for annual physical exam.   Annual physical exam  Controlled type 2 diabetes mellitus without complication, without long-term current use of insulin (HCC) - Plan: Bayer DCA Hb A1c Waived, CMP14+EGFR, CANCELED: Bayer DCA Hb A1c Waived  Hypertension associated with diabetes (HCC) - Plan: CMP14+EGFR  Hyperlipidemia associated with type 2 diabetes mellitus (HCC) - Plan: Lipid Panel, CMP14+EGFR, TSH  Osteopenia of lumbar spine - Plan: VITAMIN D 25 Hydroxy (Vit-D Deficiency, Fractures), CANCELED: VITAMIN D 25 Hydroxy (Vit-D Deficiency, Fractures)  Vitamin D deficiency - Plan: VITAMIN D 25 Hydroxy (Vit-D Deficiency, Fractures), CANCELED: VITAMIN D 25 Hydroxy (Vit-D Deficiency, Fractures)  Screening for HIV without presence of risk factors - Plan: HIV Antibody (routine testing w rflx)  Achilles tendinitis of right lower extremity  Check A1c, fasting labs.  She will have these done tomorrow.  Foot exam performed today.  Diabetic eye exam performed in December and release of information form completed today  Blood pressure well-controlled.  No changes needed  Continue cholesterol meds with Livalo  Vitamin D ordered for history of vitamin D deficiency associated with osteopenia of the lumbar spine.  Not yet due for DEXA scan  Screening HIV ordered  Suspect Achilles tendinitis of the right lower extremity.  Discussed consideration for referral to sports medicine to discuss potential nitroglycerin treatment.  I will give her home physical therapy exercise to utilize in the interim  Patient to follow up in 4-34m for DM  Krishav Mamone M. Nadine Counts, DO

## 2023-04-23 ENCOUNTER — Other Ambulatory Visit: Payer: Self-pay | Admitting: Family Medicine

## 2023-04-23 ENCOUNTER — Other Ambulatory Visit (HOSPITAL_COMMUNITY): Payer: Self-pay

## 2023-04-23 DIAGNOSIS — I152 Hypertension secondary to endocrine disorders: Secondary | ICD-10-CM

## 2023-04-25 ENCOUNTER — Other Ambulatory Visit: Payer: Self-pay

## 2023-04-25 ENCOUNTER — Other Ambulatory Visit (HOSPITAL_COMMUNITY): Payer: Self-pay

## 2023-04-25 DIAGNOSIS — I152 Hypertension secondary to endocrine disorders: Secondary | ICD-10-CM

## 2023-04-25 DIAGNOSIS — E119 Type 2 diabetes mellitus without complications: Secondary | ICD-10-CM

## 2023-04-25 MED ORDER — FUROSEMIDE 40 MG PO TABS
40.0000 mg | ORAL_TABLET | Freq: Every day | ORAL | 0 refills | Status: DC
Start: 2023-04-25 — End: 2023-05-17
  Filled 2023-04-25: qty 90, 90d supply, fill #0

## 2023-04-25 MED ORDER — METFORMIN HCL 500 MG PO TABS
500.0000 mg | ORAL_TABLET | Freq: Two times a day (BID) | ORAL | 0 refills | Status: DC
Start: 2023-04-25 — End: 2023-05-17
  Filled 2023-04-25: qty 180, 90d supply, fill #0

## 2023-04-25 MED ORDER — RAMIPRIL 10 MG PO CAPS
20.0000 mg | ORAL_CAPSULE | Freq: Every day | ORAL | 0 refills | Status: DC
Start: 2023-04-25 — End: 2023-05-17
  Filled 2023-04-25: qty 180, 90d supply, fill #0

## 2023-04-25 NOTE — Telephone Encounter (Signed)
Pt requested refills of Ramipril, Furosemide and Metformin to be sent to [facility] outpt pharm. Refills sent.

## 2023-05-17 ENCOUNTER — Other Ambulatory Visit (HOSPITAL_COMMUNITY): Payer: Self-pay

## 2023-05-17 ENCOUNTER — Other Ambulatory Visit: Payer: Self-pay | Admitting: *Deleted

## 2023-05-17 ENCOUNTER — Other Ambulatory Visit: Payer: Self-pay

## 2023-05-17 DIAGNOSIS — E1159 Type 2 diabetes mellitus with other circulatory complications: Secondary | ICD-10-CM

## 2023-05-17 DIAGNOSIS — E119 Type 2 diabetes mellitus without complications: Secondary | ICD-10-CM

## 2023-05-17 DIAGNOSIS — E1169 Type 2 diabetes mellitus with other specified complication: Secondary | ICD-10-CM

## 2023-05-17 MED ORDER — RAMIPRIL 10 MG PO CAPS
20.0000 mg | ORAL_CAPSULE | Freq: Every day | ORAL | 0 refills | Status: DC
Start: 2023-05-17 — End: 2023-08-15
  Filled 2023-05-17 – 2023-07-23 (×2): qty 180, 90d supply, fill #0

## 2023-05-17 MED ORDER — PITAVASTATIN CALCIUM 2 MG PO TABS
1.0000 | ORAL_TABLET | Freq: Every day | ORAL | 1 refills | Status: DC
Start: 2023-05-17 — End: 2023-08-15
  Filled 2023-05-17: qty 90, 90d supply, fill #0

## 2023-05-17 MED ORDER — METFORMIN HCL 500 MG PO TABS
500.0000 mg | ORAL_TABLET | Freq: Two times a day (BID) | ORAL | 0 refills | Status: DC
Start: 2023-05-17 — End: 2023-08-15
  Filled 2023-05-17: qty 180, 90d supply, fill #0

## 2023-05-17 MED ORDER — FUROSEMIDE 40 MG PO TABS
40.0000 mg | ORAL_TABLET | Freq: Every day | ORAL | 0 refills | Status: DC
Start: 2023-05-17 — End: 2023-10-01
  Filled 2023-05-17 – 2023-07-23 (×2): qty 90, 90d supply, fill #0

## 2023-05-17 NOTE — Telephone Encounter (Signed)
Pt aware = refills done

## 2023-05-23 ENCOUNTER — Encounter: Payer: Self-pay | Admitting: Family

## 2023-05-23 ENCOUNTER — Ambulatory Visit: Payer: 59 | Admitting: Family

## 2023-05-23 VITALS — BP 118/69 | HR 89 | Temp 98.7°F | Ht 67.0 in | Wt 231.0 lb

## 2023-05-23 DIAGNOSIS — H6991 Unspecified Eustachian tube disorder, right ear: Secondary | ICD-10-CM | POA: Diagnosis not present

## 2023-05-23 MED ORDER — CETIRIZINE HCL 10 MG PO TABS: 10.0000 mg | ORAL_TABLET | Freq: Every day | ORAL | 1 refills | Status: DC

## 2023-05-23 NOTE — Patient Instructions (Signed)
Eustachian Tube Dysfunction  Eustachian tube dysfunction refers to a condition in which a blockage develops in the narrow passage that connects the middle ear to the back of the nose (eustachian tube). The eustachian tube regulates air pressure in the middle ear by letting air move between the ear and nose. It also helps to drain fluid from the middle ear space. Eustachian tube dysfunction can affect one or both ears. When the eustachian tube does not function properly, air pressure, fluid, or both can build up in the middle ear. What are the causes? This condition occurs when the eustachian tube becomes blocked or cannot open normally. Common causes of this condition include: Ear infections. Colds and other infections that affect the nose, mouth, and throat (upper respiratory tract). Allergies. Irritation from cigarette smoke. Irritation from stomach acid coming up into the esophagus (gastroesophageal reflux). The esophagus is the part of the body that moves food from the mouth to the stomach. Sudden changes in air pressure, such as from descending in an airplane or scuba diving. Abnormal growths in the nose or throat, such as: Growths that line the nose (nasal polyps). Abnormal growth of cells (tumors). Enlarged tissue at the back of the throat (adenoids). What increases the risk? You are more likely to develop this condition if: You smoke. You are overweight. You are a child who has: Certain birth defects of the mouth, such as cleft palate. Large tonsils or adenoids. What are the signs or symptoms? Common symptoms of this condition include: A feeling of fullness in the ear. Ear pain. Clicking or popping noises in the ear. Ringing in the ear (tinnitus). Hearing loss. Loss of balance. Dizziness. Symptoms may get worse when the air pressure around you changes, such as when you travel to an area of high elevation, fly on an airplane, or go scuba diving. How is this diagnosed? This  condition may be diagnosed based on: Your symptoms. A physical exam of your ears, nose, and throat. Tests, such as those that measure: The movement of your eardrum. Your hearing (audiometry). How is this treated? Treatment depends on the cause and severity of your condition. In mild cases, you may relieve your symptoms by moving air into your ears. This is called "popping the ears." In more severe cases, or if you have symptoms of fluid in your ears, treatment may include: Medicines to relieve congestion (decongestants). Medicines that treat allergies (antihistamines). Nasal sprays or ear drops that contain medicines that reduce swelling (steroids). A procedure to drain the fluid in your eardrum. In this procedure, a small tube may be placed in the eardrum to: Drain the fluid. Restore the air in the middle ear space. A procedure to insert a balloon device through the nose to inflate the opening of the eustachian tube (balloon dilation). Follow these instructions at home: Lifestyle Do not do any of the following until your health care provider approves: Travel to high altitudes. Fly in airplanes. Work in a pressurized cabin or room. Scuba dive. Do not use any products that contain nicotine or tobacco. These products include cigarettes, chewing tobacco, and vaping devices, such as e-cigarettes. If you need help quitting, ask your health care provider. Keep your ears dry. Wear fitted earplugs during showering and bathing. Dry your ears completely after. General instructions Take over-the-counter and prescription medicines only as told by your health care provider. Use techniques to help pop your ears as recommended by your health care provider. These may include: Chewing gum. Yawning. Frequent, forceful swallowing.   Closing your mouth, holding your nose closed, and gently blowing as if you are trying to blow air out of your nose. Keep all follow-up visits. This is important. Contact a  health care provider if: Your symptoms do not go away after treatment. Your symptoms come back after treatment. You are unable to pop your ears. You have: A fever. Pain in your ear. Pain in your head or neck. Fluid draining from your ear. Your hearing suddenly changes. You become very dizzy. You lose your balance. Get help right away if: You have a sudden, severe increase in any of your symptoms. Summary Eustachian tube dysfunction refers to a condition in which a blockage develops in the eustachian tube. It can be caused by ear infections, allergies, inhaled irritants, or abnormal growths in the nose or throat. Symptoms may include ear pain or fullness, hearing loss, or ringing in the ears. Mild cases are treated with techniques to unblock the ears, such as yawning or chewing gum. More severe cases are treated with medicines or procedures. This information is not intended to replace advice given to you by your health care provider. Make sure you discuss any questions you have with your health care provider. Document Revised: 12/01/2020 Document Reviewed: 12/01/2020 Elsevier Patient Education  2024 [employer]  

## 2023-05-23 NOTE — Progress Notes (Signed)
Subjective:    Patient ID: [name], female    DOB: 1961-01-18, 62 y.o.   MRN: [id number]  Chief Complaint  Patient presents with   Ear Pain    Right ear last 3-4 days using nasal spray    Pt presents to the office today with right ear that started a few days ago with fullness and pressure and yesterday noticed increased pain.  Ear Fullness  There is pain in the right ear. This is a new problem. The current episode started in the past 7 days. The problem occurs constantly. The problem has been unchanged. There has been no fever. The pain is at a severity of 2/10. The pain is mild. Associated symptoms include a sore throat. Pertinent negatives include no coughing, ear discharge, headaches, hearing loss or rhinorrhea. Treatments tried: flonase. The treatment provided mild relief.      Review of Systems  HENT:  Positive for sore throat. Negative for ear discharge, hearing loss and rhinorrhea.   Respiratory:  Negative for cough.   Neurological:  Negative for headaches.  All other systems reviewed and are negative.      Objective:   Physical Exam Vitals reviewed.  Constitutional:      General: She is not in acute distress.    Appearance: She is well-developed.  HENT:     Head: Normocephalic and atraumatic.     Right Ear: A middle ear effusion is present.     Left Ear: A middle ear effusion is present.  Eyes:     Pupils: Pupils are equal, round, and reactive to light.  Neck:     Thyroid: No thyromegaly.  Cardiovascular:     Rate and Rhythm: Normal rate and regular rhythm.     Heart sounds: Normal heart sounds. No murmur heard. Pulmonary:     Effort: Pulmonary effort is normal. No respiratory distress.     Breath sounds: Normal breath sounds. No wheezing.  Abdominal:     General: Bowel sounds are normal. There is no distension.     Palpations: Abdomen is soft.     Tenderness: There is no abdominal tenderness.  Musculoskeletal:        General: No tenderness. Normal  range of motion.     Cervical back: Normal range of motion and neck supple.  Skin:    General: Skin is warm and dry.  Neurological:     Mental Status: She is alert and oriented to person, place, and time.     Cranial Nerves: No cranial nerve deficit.     Deep Tendon Reflexes: Reflexes are normal and symmetric.  Psychiatric:        Behavior: Behavior normal.        Thought Content: Thought content normal.        Judgment: Judgment normal.       BP 118/69   Pulse 89   Temp 98.7 F (37.1 C) (Temporal)   Ht 5\' 7"  (1.702 m)   Wt 231 lb (104.8 kg)   LMP 12/26/2014 (Approximate) Comment: 2-3 days  SpO2 95%   BMI 36.18 kg/m      Assessment & Plan:   [name] comes in today with chief complaint of Ear Pain (Right ear last 3-4 days using nasal spray )   Diagnosis and orders addressed:  1. Eustachian tube disorder, right Start zyrtec and flonase  Nasal decongestant  - Take meds as prescribed - Use a cool mist humidifier  -Use saline nose sprays frequently -  Force fluids -For any cough or congestion  Use plain Mucinex- regular strength or max strength is fine -For fever or aces or pains- take tylenol or ibuprofen. -Throat lozenges if help -Follow up if symptoms worsen or do not improve  - cetirizine (ZYRTEC ALLERGY) 10 MG tablet; Take 1 tablet (10 mg total) by mouth daily.  Dispense: 90 tablet; Refill: 1   Jannifer Rodney, FNP

## 2023-06-13 ENCOUNTER — Other Ambulatory Visit: Payer: Self-pay | Admitting: Family

## 2023-06-13 MED ORDER — AMOXICILLIN-POT CLAVULANATE 875-125 MG PO TABS: 1.0000 | ORAL_TABLET | Freq: Two times a day (BID) | ORAL | 0 refills | Status: DC

## 2023-06-20 DIAGNOSIS — E119 Type 2 diabetes mellitus without complications: Secondary | ICD-10-CM | POA: Diagnosis not present

## 2023-06-20 DIAGNOSIS — H5213 Myopia, bilateral: Secondary | ICD-10-CM | POA: Diagnosis not present

## 2023-06-20 DIAGNOSIS — H2513 Age-related nuclear cataract, bilateral: Secondary | ICD-10-CM | POA: Diagnosis not present

## 2023-06-20 DIAGNOSIS — H524 Presbyopia: Secondary | ICD-10-CM | POA: Diagnosis not present

## 2023-06-20 DIAGNOSIS — H52223 Regular astigmatism, bilateral: Secondary | ICD-10-CM | POA: Diagnosis not present

## 2023-06-20 DIAGNOSIS — Z135 Encounter for screening for eye and ear disorders: Secondary | ICD-10-CM | POA: Diagnosis not present

## 2023-06-20 LAB — HM DIABETES EYE EXAM

## 2023-07-11 ENCOUNTER — Other Ambulatory Visit (HOSPITAL_COMMUNITY): Payer: Self-pay

## 2023-07-23 ENCOUNTER — Other Ambulatory Visit (HOSPITAL_COMMUNITY): Payer: Self-pay

## 2023-07-27 ENCOUNTER — Other Ambulatory Visit (HOSPITAL_COMMUNITY): Payer: Self-pay

## 2023-08-15 ENCOUNTER — Other Ambulatory Visit: Payer: Self-pay

## 2023-08-15 ENCOUNTER — Other Ambulatory Visit (HOSPITAL_COMMUNITY): Payer: Self-pay

## 2023-08-15 DIAGNOSIS — E119 Type 2 diabetes mellitus without complications: Secondary | ICD-10-CM

## 2023-08-15 DIAGNOSIS — E1159 Type 2 diabetes mellitus with other circulatory complications: Secondary | ICD-10-CM

## 2023-08-15 DIAGNOSIS — E1169 Type 2 diabetes mellitus with other specified complication: Secondary | ICD-10-CM

## 2023-08-15 MED ORDER — PITAVASTATIN CALCIUM 2 MG PO TABS
1.0000 | ORAL_TABLET | Freq: Every day | ORAL | 1 refills | Status: DC
  Filled 2023-08-15: qty 90, 90d supply, fill #0
  Filled 2023-11-16: qty 90, 90d supply, fill #1

## 2023-08-15 MED ORDER — RAMIPRIL 10 MG PO CAPS
20.0000 mg | ORAL_CAPSULE | Freq: Every day | ORAL | 1 refills | Status: DC
Start: 2023-08-15 — End: 2024-04-19
  Filled 2023-08-15 – 2023-10-17 (×4): qty 180, 90d supply, fill #0
  Filled 2024-01-16: qty 180, 90d supply, fill #1

## 2023-08-15 MED ORDER — METFORMIN HCL 500 MG PO TABS
500.0000 mg | ORAL_TABLET | Freq: Two times a day (BID) | ORAL | 0 refills | Status: DC
  Filled 2023-08-15: qty 180, 90d supply, fill #0

## 2023-09-08 ENCOUNTER — Other Ambulatory Visit: Payer: 59

## 2023-09-08 DIAGNOSIS — M8588 Other specified disorders of bone density and structure, other site: Secondary | ICD-10-CM

## 2023-09-08 DIAGNOSIS — E559 Vitamin D deficiency, unspecified: Secondary | ICD-10-CM | POA: Diagnosis not present

## 2023-09-08 DIAGNOSIS — E1169 Type 2 diabetes mellitus with other specified complication: Secondary | ICD-10-CM | POA: Diagnosis not present

## 2023-09-08 DIAGNOSIS — Z114 Encounter for screening for human immunodeficiency virus [HIV]: Secondary | ICD-10-CM | POA: Diagnosis not present

## 2023-09-08 DIAGNOSIS — E119 Type 2 diabetes mellitus without complications: Secondary | ICD-10-CM | POA: Diagnosis not present

## 2023-09-08 DIAGNOSIS — I152 Hypertension secondary to endocrine disorders: Secondary | ICD-10-CM | POA: Diagnosis not present

## 2023-09-08 DIAGNOSIS — E1159 Type 2 diabetes mellitus with other circulatory complications: Secondary | ICD-10-CM

## 2023-09-08 DIAGNOSIS — E785 Hyperlipidemia, unspecified: Secondary | ICD-10-CM | POA: Diagnosis not present

## 2023-09-08 LAB — BAYER DCA HB A1C WAIVED: HB A1C (BAYER DCA - WAIVED): 6.4 % — ABNORMAL HIGH (ref 4.8–5.6)

## 2023-09-09 LAB — CMP14+EGFR
ALT: 24 [IU]/L (ref 0–32)
AST: 21 [IU]/L (ref 0–40)
Albumin: 4.4 g/dL (ref 3.9–4.9)
Alkaline Phosphatase: 75 [IU]/L (ref 44–121)
BUN/Creatinine Ratio: 11 — ABNORMAL LOW (ref 12–28)
BUN: 10 mg/dL (ref 8–27)
Bilirubin Total: 0.4 mg/dL (ref 0.0–1.2)
CO2: 27 mmol/L (ref 20–29)
Calcium: 9.7 mg/dL (ref 8.7–10.3)
Chloride: 100 mmol/L (ref 96–106)
Creatinine, Ser: 0.94 mg/dL (ref 0.57–1.00)
Globulin, Total: 2.1 g/dL (ref 1.5–4.5)
Glucose: 155 mg/dL — ABNORMAL HIGH (ref 70–99)
Potassium: 3.9 mmol/L (ref 3.5–5.2)
Sodium: 140 mmol/L (ref 134–144)
Total Protein: 6.5 g/dL (ref 6.0–8.5)
eGFR: 69 mL/min/{1.73_m2} (ref >59)

## 2023-09-09 LAB — LIPID PANEL
Chol/HDL Ratio: 3.8 {ratio} (ref 0.0–4.4)
Cholesterol, Total: 135 mg/dL (ref 100–199)
HDL: 36 mg/dL — ABNORMAL LOW (ref >39)
LDL Chol Calc (NIH): 66 mg/dL (ref 0–99)
Triglycerides: 199 mg/dL — ABNORMAL HIGH (ref 0–149)
VLDL Cholesterol Cal: 33 mg/dL (ref 5–40)

## 2023-09-09 LAB — HIV ANTIBODY (ROUTINE TESTING W REFLEX): HIV Screen 4th Generation wRfx: NONREACTIVE

## 2023-09-09 LAB — TSH: TSH: 1.74 u[IU]/mL (ref 0.450–4.500)

## 2023-09-09 LAB — VITAMIN D 25 HYDROXY (VIT D DEFICIENCY, FRACTURES): Vit D, 25-Hydroxy: 21 ng/mL — ABNORMAL LOW (ref 30.0–100.0)

## 2023-10-01 ENCOUNTER — Other Ambulatory Visit (HOSPITAL_COMMUNITY): Payer: Self-pay

## 2023-10-01 ENCOUNTER — Other Ambulatory Visit: Payer: Self-pay | Admitting: Family Medicine

## 2023-10-01 DIAGNOSIS — I152 Hypertension secondary to endocrine disorders: Secondary | ICD-10-CM

## 2023-10-03 ENCOUNTER — Other Ambulatory Visit: Payer: Self-pay

## 2023-10-03 ENCOUNTER — Other Ambulatory Visit (HOSPITAL_COMMUNITY): Payer: Self-pay

## 2023-10-03 MED ORDER — FUROSEMIDE 40 MG PO TABS
40.0000 mg | ORAL_TABLET | Freq: Every day | ORAL | 0 refills | Status: DC
Start: 2023-10-03 — End: 2024-01-16
  Filled 2023-10-03: qty 90, 90d supply, fill #0

## 2023-10-04 ENCOUNTER — Other Ambulatory Visit: Payer: Self-pay

## 2023-10-10 DIAGNOSIS — Z1231 Encounter for screening mammogram for malignant neoplasm of breast: Secondary | ICD-10-CM | POA: Diagnosis not present

## 2023-10-10 DIAGNOSIS — Z01419 Encounter for gynecological examination (general) (routine) without abnormal findings: Secondary | ICD-10-CM | POA: Diagnosis not present

## 2023-10-10 LAB — HM MAMMOGRAPHY

## 2023-10-17 ENCOUNTER — Ambulatory Visit (INDEPENDENT_AMBULATORY_CARE_PROVIDER_SITE_OTHER): Payer: 59 | Admitting: Nurse Practitioner

## 2023-10-17 ENCOUNTER — Encounter: Payer: Self-pay | Admitting: Nurse Practitioner

## 2023-10-17 ENCOUNTER — Other Ambulatory Visit (HOSPITAL_COMMUNITY): Payer: Self-pay

## 2023-10-17 ENCOUNTER — Other Ambulatory Visit: Payer: Self-pay

## 2023-10-17 VITALS — BP 116/70 | HR 103 | Temp 97.9°F | Ht 67.0 in | Wt 232.0 lb

## 2023-10-17 DIAGNOSIS — U071 COVID-19: Secondary | ICD-10-CM | POA: Diagnosis not present

## 2023-10-17 MED ORDER — AZITHROMYCIN 250 MG PO TABS: ORAL_TABLET | ORAL | 0 refills | Status: DC

## 2023-10-17 MED ORDER — METHYLPREDNISOLONE ACETATE 80 MG/ML IJ SUSP
80.0000 mg | Freq: Once | INTRAMUSCULAR | Status: AC
  Administered 2023-10-17: 80 mg via INTRAMUSCULAR

## 2023-10-17 NOTE — Addendum Note (Signed)
 Addended by: Bennie Pierini on: 10/17/2023 02:14 PM   Modules accepted: Level of Service

## 2023-10-17 NOTE — Progress Notes (Signed)
 Subjective:    Patient ID: [name], female    DOB: 08-29-1961, 63 y.o.   MRN: [id number]   Chief Complaint: cough   Tested positive at home for covid today  Cough This is a new problem. The current episode started yesterday. The cough is Productive of sputum. Associated symptoms include rhinorrhea and a sore throat. Pertinent negatives include no chest pain, chills, ear congestion, headaches, rash or shortness of breath. She has tried OTC cough suppressant for the symptoms. The treatment provided mild relief.    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   Ischial bursitis of right side 11/29/2022   Dysuria 09/07/2021   Cough 04/08/2021   Disorder of cecum 11/05/2020   Osteopenia 10/24/2019   Polyarthralgia 11/30/2017   Osteoarthritis of knee 10/30/2017   Trochanteric bursitis 10/30/2017   Pain of left hip joint 10/24/2017   History of hypertension 07/06/2017   Hepatic steatosis 11/01/2016   Type 2 diabetes mellitus, controlled (HCC) 01/28/2016   Hyperlipidemia associated with type 2 diabetes mellitus (HCC) 01/28/2016   Globus sensation 09/06/2015   Gastroesophageal reflux disease with esophagitis 09/06/2015   OBESITY 10/23/2007   Hypertension associated with diabetes (HCC) 10/23/2007   IBS (irritable bowel syndrome) 10/23/2007   HIATAL HERNIA 10/07/2006   POLYP, COLON 09/14/2006       Review of Systems  Constitutional:  Negative for chills and diaphoresis.  HENT:  Positive for rhinorrhea and sore throat.   Eyes:  Negative for pain.  Respiratory:  Positive for cough. Negative for shortness of breath.   Cardiovascular:  Negative for chest pain, palpitations and leg swelling.  Gastrointestinal:  Negative for abdominal pain.  Endocrine: Negative for polydipsia.  Skin:  Negative for rash.  Neurological:  Negative for dizziness, weakness and headaches.  Hematological:  Does not  bruise/bleed easily.  All other systems reviewed and are negative.      Objective:   Physical Exam Constitutional:      Appearance: Normal appearance. She is obese.  HENT:     Right Ear: Tympanic membrane normal.     Left Ear: Tympanic membrane normal.     Nose: Congestion and rhinorrhea present.     Mouth/Throat:     Mouth: Mucous membranes are moist.  Cardiovascular:     Rate and Rhythm: Normal rate and regular rhythm.     Heart sounds: Normal heart sounds.  Pulmonary:     Effort: Pulmonary effort is normal.     Breath sounds: Normal breath sounds.     Comments: Deep cough  Neurological:     Mental Status: She is alert.     BP 116/70   Pulse (!) 103   Temp 97.9 F (36.6 C) (Temporal)   Ht 5' 7 (1.702 m)   Wt 232 lb (105.2 kg)   LMP 12/26/2014 (Approximate) Comment: 2-3 days  SpO2 99%   BMI 36.34 kg/m        Assessment & Plan:   Derrick B Stroope in today with chief complaint of No chief complaint on file.   1. Positive self-administered antigen test for COVID-19 (Primary) 1. Take meds as prescribed 2. Use a cool mist humidifier especially during the winter months and when heat has been humid. 3. Use saline nose sprays frequently 4. Saline irrigations of the nose can be very helpful if done frequently.  * 4X daily for 1 week*  * Use of a  nettie pot can be helpful with this. Follow directions with this* 5. Drink plenty of fluids 6. Keep thermostat turn down low 7.For any cough or congestion- mucinex  OTC 8. For fever or aces or pains- take tylenol  or ibuprofen appropriate for age and weight.  * for fevers greater than 101 orally you may alternate ibuprofen and tylenol  every  3 hours.    - methylPREDNISolone  acetate (DEPO-MEDROL ) injection 80 mg  Meds ordered this encounter  Medications   methylPREDNISolone  acetate (DEPO-MEDROL ) injection 80 mg   azithromycin  (ZITHROMAX  Z-PAK) 250 MG tablet    Sig: As directed    Dispense:  6 tablet    Refill:  0     Supervising Provider:   MARYANNE CHEW A [[id number]]     The above assessment and management plan was discussed with the patient. The patient verbalized understanding of and has agreed to the management plan. Patient is aware to call the clinic if symptoms persist or worsen. Patient is aware when to return to the clinic for a follow-up visit. Patient educated on when it is appropriate to go to the emergency department.   [name]-Margaret Gladis, FNP

## 2023-10-17 NOTE — Patient Instructions (Signed)

## 2023-11-16 ENCOUNTER — Other Ambulatory Visit: Payer: Self-pay

## 2023-11-16 ENCOUNTER — Other Ambulatory Visit (HOSPITAL_COMMUNITY): Payer: Self-pay

## 2023-11-16 ENCOUNTER — Other Ambulatory Visit: Payer: Self-pay | Admitting: Family Medicine

## 2023-11-16 DIAGNOSIS — E119 Type 2 diabetes mellitus without complications: Secondary | ICD-10-CM

## 2023-11-16 MED ORDER — METFORMIN HCL 500 MG PO TABS
500.0000 mg | ORAL_TABLET | Freq: Two times a day (BID) | ORAL | 0 refills | Status: DC
  Filled 2023-11-16: qty 180, 90d supply, fill #0

## 2023-11-23 ENCOUNTER — Ambulatory Visit: Payer: 59 | Admitting: Family Medicine

## 2024-01-03 ENCOUNTER — Other Ambulatory Visit (HOSPITAL_COMMUNITY): Payer: Self-pay

## 2024-01-16 ENCOUNTER — Other Ambulatory Visit (HOSPITAL_COMMUNITY): Payer: Self-pay

## 2024-01-16 ENCOUNTER — Other Ambulatory Visit: Payer: Self-pay

## 2024-01-16 ENCOUNTER — Other Ambulatory Visit: Payer: Self-pay | Admitting: Family Medicine

## 2024-01-16 DIAGNOSIS — E1159 Type 2 diabetes mellitus with other circulatory complications: Secondary | ICD-10-CM

## 2024-01-16 MED ORDER — FUROSEMIDE 40 MG PO TABS
40.0000 mg | ORAL_TABLET | Freq: Every day | ORAL | 0 refills | Status: DC
  Filled 2024-01-16: qty 90, 90d supply, fill #0

## 2024-02-16 ENCOUNTER — Ambulatory Visit (INDEPENDENT_AMBULATORY_CARE_PROVIDER_SITE_OTHER): Admitting: Nurse Practitioner

## 2024-02-16 ENCOUNTER — Ambulatory Visit: Payer: Self-pay | Admitting: Nurse Practitioner

## 2024-02-16 ENCOUNTER — Encounter: Payer: Self-pay | Admitting: Nurse Practitioner

## 2024-02-16 VITALS — BP 127/73 | HR 73 | Temp 97.6°F | Ht 67.0 in | Wt 232.0 lb

## 2024-02-16 DIAGNOSIS — R1031 Right lower quadrant pain: Secondary | ICD-10-CM | POA: Diagnosis not present

## 2024-02-16 DIAGNOSIS — R300 Dysuria: Secondary | ICD-10-CM

## 2024-02-16 LAB — URINALYSIS, ROUTINE W REFLEX MICROSCOPIC
Bilirubin, UA: NEGATIVE
Glucose, UA: NEGATIVE
Ketones, UA: NEGATIVE
Leukocytes,UA: NEGATIVE
Nitrite, UA: NEGATIVE
Protein,UA: NEGATIVE
RBC, UA: NEGATIVE
Specific Gravity, UA: 1.01 (ref 1.005–1.030)
Urobilinogen, Ur: 0.2 mg/dL (ref 0.2–1.0)
pH, UA: 5.5 (ref 5.0–7.5)

## 2024-02-16 LAB — MICROSCOPIC EXAMINATION
Bacteria, UA: NONE SEEN
Epithelial Cells (non renal): NONE SEEN /HPF (ref 0–10)
RBC, Urine: NONE SEEN /HPF (ref 0–2)
Renal Epithel, UA: NONE SEEN /HPF
WBC, UA: NONE SEEN /HPF (ref 0–5)

## 2024-02-16 NOTE — Progress Notes (Signed)
   Subjective:    Patient ID: [name], female    DOB: September 27, 1961, 63 y.o.   MRN: [id number]   Chief Complaint: Abdominal Pain (Right lower abdomen/)   HPI  Patient in c/o right lower quadrant that started 3 days ago. She has had this pain in the past when she had kidney infection. She has had some dysuria, frequency and urgency. No fever. Not sexually active.   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   Ischial bursitis of right side 11/29/2022   Dysuria 09/07/2021   Cough 04/08/2021   Disorder of cecum 11/05/2020   Osteopenia 10/24/2019   Polyarthralgia 11/30/2017   Osteoarthritis of knee 10/30/2017   Trochanteric bursitis 10/30/2017   Pain of left hip joint 10/24/2017   History of hypertension 07/06/2017   Hepatic steatosis 11/01/2016   Type 2 diabetes mellitus, controlled (HCC) 01/28/2016   Hyperlipidemia associated with type 2 diabetes mellitus (HCC) 01/28/2016   Globus sensation 09/06/2015   Gastroesophageal reflux disease with esophagitis 09/06/2015   OBESITY 10/23/2007   Hypertension associated with diabetes (HCC) 10/23/2007   IBS (irritable bowel syndrome) 10/23/2007   HIATAL HERNIA 10/07/2006   POLYP, COLON 09/14/2006       Review of Systems  Constitutional:  Negative for chills and fever.  Gastrointestinal:  Negative for abdominal pain, nausea and vomiting.  Genitourinary:  Positive for frequency and urgency.       Objective:   Physical Exam Constitutional:      Appearance: She is well-developed. She is obese.  Cardiovascular:     Rate and Rhythm: Normal rate and regular rhythm.     Heart sounds: Normal heart sounds.  Pulmonary:     Breath sounds: Normal breath sounds.  Abdominal:     General: Abdomen is flat.     Tenderness: There is abdominal tenderness (mild suprapubic pain on palpation).  Neurological:     General: No focal deficit present.     Mental  Status: She is alert and oriented to person, place, and time.  Psychiatric:        Mood and Affect: Mood normal.        Behavior: Behavior normal.     BP 127/73   Pulse 73   Temp 97.6 F (36.4 C) (Temporal)   Ht 5\' 7"  (1.702 m)   Wt 232 lb (105.2 kg)   LMP 12/26/2014 (Approximate) Comment: 2-3 days  SpO2 100%   BMI 36.34 kg/m   Urine clear      Assessment & Plan:  [name] in today with chief complaint of Abdominal Pain (Right lower abdomen/)   1. Dysuria (Primary) - Urinalysis, Routine w reflex microscopic - Urine Culture  2. Acute right lower quadrant pain Patient refused further workup Says probably constipated    The above assessment and management plan was discussed with the patient. The patient verbalized understanding of and has agreed to the management plan. Patient is aware to call the clinic if symptoms persist or worsen. Patient is aware when to return to the clinic for a follow-up visit. Patient educated on when it is appropriate to go to the emergency department.   [name]-Margaret Gaylyn Keas, FNP

## 2024-02-18 ENCOUNTER — Other Ambulatory Visit: Payer: Self-pay | Admitting: Family Medicine

## 2024-02-18 ENCOUNTER — Other Ambulatory Visit (HOSPITAL_COMMUNITY): Payer: Self-pay

## 2024-02-18 DIAGNOSIS — E119 Type 2 diabetes mellitus without complications: Secondary | ICD-10-CM

## 2024-02-18 DIAGNOSIS — E1169 Type 2 diabetes mellitus with other specified complication: Secondary | ICD-10-CM

## 2024-02-18 LAB — URINE CULTURE

## 2024-02-20 ENCOUNTER — Other Ambulatory Visit (HOSPITAL_COMMUNITY): Payer: Self-pay

## 2024-02-20 MED ORDER — PITAVASTATIN CALCIUM 2 MG PO TABS
1.0000 | ORAL_TABLET | Freq: Every day | ORAL | 0 refills | Status: DC
Start: 2024-02-20 — End: 2024-04-19
  Filled 2024-02-20: qty 90, 90d supply, fill #0

## 2024-02-20 MED ORDER — METFORMIN HCL 500 MG PO TABS
500.0000 mg | ORAL_TABLET | Freq: Two times a day (BID) | ORAL | 0 refills | Status: DC
  Filled 2024-02-20: qty 180, 90d supply, fill #0

## 2024-02-24 ENCOUNTER — Other Ambulatory Visit (HOSPITAL_COMMUNITY): Payer: Self-pay

## 2024-03-29 ENCOUNTER — Other Ambulatory Visit: Payer: Self-pay

## 2024-03-29 DIAGNOSIS — E119 Type 2 diabetes mellitus without complications: Secondary | ICD-10-CM

## 2024-03-31 ENCOUNTER — Other Ambulatory Visit (HOSPITAL_COMMUNITY): Payer: Self-pay

## 2024-04-19 ENCOUNTER — Other Ambulatory Visit: Payer: Self-pay

## 2024-04-19 ENCOUNTER — Other Ambulatory Visit: Payer: Self-pay | Admitting: Family Medicine

## 2024-04-19 ENCOUNTER — Other Ambulatory Visit (HOSPITAL_COMMUNITY): Payer: Self-pay

## 2024-04-19 DIAGNOSIS — E119 Type 2 diabetes mellitus without complications: Secondary | ICD-10-CM

## 2024-04-19 DIAGNOSIS — I152 Hypertension secondary to endocrine disorders: Secondary | ICD-10-CM

## 2024-04-19 DIAGNOSIS — E1169 Type 2 diabetes mellitus with other specified complication: Secondary | ICD-10-CM

## 2024-04-19 MED ORDER — METFORMIN HCL 500 MG PO TABS
500.0000 mg | ORAL_TABLET | Freq: Two times a day (BID) | ORAL | 0 refills | Status: DC
Start: 2024-04-19 — End: 2024-08-27
  Filled 2024-04-19 – 2024-06-02 (×2): qty 180, 90d supply, fill #0

## 2024-04-19 MED ORDER — FUROSEMIDE 40 MG PO TABS
40.0000 mg | ORAL_TABLET | Freq: Every day | ORAL | 0 refills | Status: DC
  Filled 2024-04-19: qty 90, 90d supply, fill #0

## 2024-04-19 MED ORDER — RAMIPRIL 10 MG PO CAPS
20.0000 mg | ORAL_CAPSULE | Freq: Every day | ORAL | 0 refills | Status: DC
  Filled 2024-04-19: qty 180, 90d supply, fill #0

## 2024-04-19 MED ORDER — PITAVASTATIN CALCIUM 2 MG PO TABS
1.0000 | ORAL_TABLET | Freq: Every day | ORAL | 0 refills | Status: DC
  Filled 2024-04-19 – 2024-06-02 (×2): qty 90, 90d supply, fill #0

## 2024-04-21 ENCOUNTER — Other Ambulatory Visit (HOSPITAL_COMMUNITY): Payer: Self-pay

## 2024-04-28 ENCOUNTER — Other Ambulatory Visit (HOSPITAL_COMMUNITY): Payer: Self-pay

## 2024-04-30 ENCOUNTER — Other Ambulatory Visit: Payer: Self-pay

## 2024-04-30 ENCOUNTER — Other Ambulatory Visit (HOSPITAL_COMMUNITY): Payer: Self-pay

## 2024-04-30 ENCOUNTER — Telehealth: Payer: Self-pay | Admitting: *Deleted

## 2024-04-30 MED ORDER — FREESTYLE LANCETS MISC
3 refills | Status: AC
  Filled 2024-04-30: qty 200, 90d supply, fill #0
  Filled 2024-10-02: qty 200, 90d supply, fill #1

## 2024-04-30 MED ORDER — FREESTYLE LITE TEST VI STRP
ORAL_STRIP | 3 refills | Status: AC
  Filled 2024-04-30: qty 100, 50d supply, fill #0
  Filled 2024-06-02 – 2024-06-09 (×3): qty 100, 50d supply, fill #1
  Filled 2024-10-02: qty 100, 50d supply, fill #2
  Filled ????-??-??: fill #1

## 2024-04-30 NOTE — Telephone Encounter (Signed)
 Needs RFs on test strips & lancets, sent into [facility]

## 2024-06-02 ENCOUNTER — Other Ambulatory Visit (HOSPITAL_COMMUNITY): Payer: Self-pay

## 2024-06-03 ENCOUNTER — Other Ambulatory Visit: Payer: Self-pay

## 2024-06-06 ENCOUNTER — Other Ambulatory Visit (HOSPITAL_COMMUNITY): Payer: Self-pay

## 2024-06-08 ENCOUNTER — Other Ambulatory Visit: Payer: Self-pay

## 2024-06-08 ENCOUNTER — Other Ambulatory Visit (HOSPITAL_COMMUNITY): Payer: Self-pay

## 2024-06-11 ENCOUNTER — Other Ambulatory Visit: Payer: Self-pay

## 2024-06-20 ENCOUNTER — Encounter: Payer: Self-pay | Admitting: Family Medicine

## 2024-06-20 ENCOUNTER — Other Ambulatory Visit: Payer: Self-pay | Admitting: Family Medicine

## 2024-06-20 DIAGNOSIS — M8588 Other specified disorders of bone density and structure, other site: Secondary | ICD-10-CM

## 2024-06-20 DIAGNOSIS — E119 Type 2 diabetes mellitus without complications: Secondary | ICD-10-CM

## 2024-06-20 DIAGNOSIS — E1159 Type 2 diabetes mellitus with other circulatory complications: Secondary | ICD-10-CM

## 2024-06-20 DIAGNOSIS — E1169 Type 2 diabetes mellitus with other specified complication: Secondary | ICD-10-CM

## 2024-06-20 DIAGNOSIS — E559 Vitamin D deficiency, unspecified: Secondary | ICD-10-CM

## 2024-06-25 ENCOUNTER — Other Ambulatory Visit: Payer: Self-pay | Admitting: Family Medicine

## 2024-06-25 DIAGNOSIS — Z7689 Persons encountering health services in other specified circumstances: Secondary | ICD-10-CM | POA: Diagnosis not present

## 2024-06-26 ENCOUNTER — Ambulatory Visit: Payer: Self-pay | Admitting: Family Medicine

## 2024-06-26 DIAGNOSIS — E559 Vitamin D deficiency, unspecified: Secondary | ICD-10-CM

## 2024-06-26 LAB — CBC/DIFF AMBIGUOUS DEFAULT
Basophils Absolute: 0.1 x10E3/uL (ref 0.0–0.2)
Basos: 1 %
EOS (ABSOLUTE): 0.2 x10E3/uL (ref 0.0–0.4)
Eos: 3 %
Hematocrit: 39.7 % (ref 34.0–46.6)
Hemoglobin: 12.9 g/dL (ref 11.1–15.9)
Immature Grans (Abs): 0 x10E3/uL (ref 0.0–0.1)
Immature Granulocytes: 0 %
Lymphocytes Absolute: 2.1 x10E3/uL (ref 0.7–3.1)
Lymphs: 25 %
MCH: 29.1 pg (ref 26.6–33.0)
MCHC: 32.5 g/dL (ref 31.5–35.7)
MCV: 90 fL (ref 79–97)
Monocytes Absolute: 0.5 x10E3/uL (ref 0.1–0.9)
Monocytes: 6 %
Neutrophils Absolute: 5.4 x10E3/uL (ref 1.4–7.0)
Neutrophils: 65 %
Platelets: 301 x10E3/uL (ref 150–450)
RBC: 4.43 x10E6/uL (ref 3.77–5.28)
RDW: 12.9 % (ref 11.7–15.4)
WBC: 8.3 x10E3/uL (ref 3.4–10.8)

## 2024-06-26 LAB — LIPID PANEL W/O CHOL/HDL RATIO
Cholesterol, Total: 155 mg/dL (ref 100–199)
HDL: 44 mg/dL (ref >39)
LDL Chol Calc (NIH): 84 mg/dL (ref 0–99)
Triglycerides: 158 mg/dL — ABNORMAL HIGH (ref 0–149)
VLDL Cholesterol Cal: 27 mg/dL (ref 5–40)

## 2024-06-26 LAB — COMPREHENSIVE METABOLIC PANEL WITH GFR
ALT: 25 IU/L (ref 0–32)
AST: 20 IU/L (ref 0–40)
Albumin: 4.4 g/dL (ref 3.9–4.9)
Alkaline Phosphatase: 81 IU/L (ref 49–135)
BUN/Creatinine Ratio: 13 (ref 12–28)
BUN: 13 mg/dL (ref 8–27)
Bilirubin Total: 0.3 mg/dL (ref 0.0–1.2)
CO2: 25 mmol/L (ref 20–29)
Calcium: 9.5 mg/dL (ref 8.7–10.3)
Chloride: 100 mmol/L (ref 96–106)
Creatinine, Ser: 1.02 mg/dL — ABNORMAL HIGH (ref 0.57–1.00)
Globulin, Total: 2.3 g/dL (ref 1.5–4.5)
Glucose: 170 mg/dL — ABNORMAL HIGH (ref 70–99)
Potassium: 4.3 mmol/L (ref 3.5–5.2)
Sodium: 141 mmol/L (ref 134–144)
Total Protein: 6.7 g/dL (ref 6.0–8.5)
eGFR: 62 mL/min/1.73 (ref >59)

## 2024-06-26 LAB — SPECIMEN STATUS REPORT

## 2024-06-26 LAB — HGB A1C W/O EAG: Hgb A1c MFr Bld: 6.8 % — ABNORMAL HIGH (ref 4.8–5.6)

## 2024-06-26 LAB — TSH: TSH: 1.31 u[IU]/mL (ref 0.450–4.500)

## 2024-06-26 LAB — VITAMIN D 25 HYDROXY (VIT D DEFICIENCY, FRACTURES): Vit D, 25-Hydroxy: 23.9 ng/mL — ABNORMAL LOW (ref 30.0–100.0)

## 2024-06-26 LAB — MICROALBUMIN, URINE: Microalbumin, Urine: 9.9 ug/mL

## 2024-06-26 MED ORDER — VITAMIN D (ERGOCALCIFEROL) 1.25 MG (50000 UNIT) PO CAPS: 50000.0000 [IU] | ORAL_CAPSULE | ORAL | 3 refills | Status: AC

## 2024-06-29 ENCOUNTER — Other Ambulatory Visit

## 2024-06-29 ENCOUNTER — Other Ambulatory Visit: Payer: Self-pay | Admitting: Family Medicine

## 2024-06-29 DIAGNOSIS — E119 Type 2 diabetes mellitus without complications: Secondary | ICD-10-CM | POA: Diagnosis not present

## 2024-06-29 DIAGNOSIS — Z7984 Long term (current) use of oral hypoglycemic drugs: Secondary | ICD-10-CM | POA: Diagnosis not present

## 2024-06-30 LAB — MICROALBUMIN / CREATININE URINE RATIO
Creatinine, Urine: 30.2 mg/dL
Microalb/Creat Ratio: <10 mg/g{creat} (ref 0–29)
Microalbumin, Urine: <3 ug/mL

## 2024-07-02 ENCOUNTER — Ambulatory Visit: Payer: Self-pay | Admitting: Family Medicine

## 2024-07-02 ENCOUNTER — Other Ambulatory Visit: Payer: Self-pay | Admitting: Family Medicine

## 2024-07-02 DIAGNOSIS — R7989 Other specified abnormal findings of blood chemistry: Secondary | ICD-10-CM

## 2024-07-10 ENCOUNTER — Other Ambulatory Visit

## 2024-07-10 DIAGNOSIS — E559 Vitamin D deficiency, unspecified: Secondary | ICD-10-CM

## 2024-07-10 DIAGNOSIS — E1159 Type 2 diabetes mellitus with other circulatory complications: Secondary | ICD-10-CM

## 2024-07-10 DIAGNOSIS — E785 Hyperlipidemia, unspecified: Secondary | ICD-10-CM | POA: Diagnosis not present

## 2024-07-10 DIAGNOSIS — E119 Type 2 diabetes mellitus without complications: Secondary | ICD-10-CM

## 2024-07-10 DIAGNOSIS — E1169 Type 2 diabetes mellitus with other specified complication: Secondary | ICD-10-CM

## 2024-07-10 DIAGNOSIS — M8588 Other specified disorders of bone density and structure, other site: Secondary | ICD-10-CM

## 2024-07-10 DIAGNOSIS — R7989 Other specified abnormal findings of blood chemistry: Secondary | ICD-10-CM

## 2024-07-10 DIAGNOSIS — Z7984 Long term (current) use of oral hypoglycemic drugs: Secondary | ICD-10-CM | POA: Diagnosis not present

## 2024-07-10 DIAGNOSIS — I152 Hypertension secondary to endocrine disorders: Secondary | ICD-10-CM | POA: Diagnosis not present

## 2024-07-11 ENCOUNTER — Ambulatory Visit: Payer: Self-pay | Admitting: Family Medicine

## 2024-07-11 LAB — BASIC METABOLIC PANEL WITH GFR
BUN/Creatinine Ratio: 11 — ABNORMAL LOW (ref 12–28)
BUN: 10 mg/dL (ref 8–27)
CO2: 28 mmol/L (ref 20–29)
Calcium: 9.6 mg/dL (ref 8.7–10.3)
Chloride: 99 mmol/L (ref 96–106)
Creatinine, Ser: 0.94 mg/dL (ref 0.57–1.00)
Glucose: 170 mg/dL — ABNORMAL HIGH (ref 70–99)
Potassium: 3.8 mmol/L (ref 3.5–5.2)
Sodium: 142 mmol/L (ref 134–144)
eGFR: 69 mL/min/1.73 (ref >59)

## 2024-07-19 ENCOUNTER — Other Ambulatory Visit (HOSPITAL_COMMUNITY): Payer: Self-pay

## 2024-07-19 ENCOUNTER — Other Ambulatory Visit: Payer: Self-pay

## 2024-07-19 ENCOUNTER — Other Ambulatory Visit: Payer: Self-pay | Admitting: Family Medicine

## 2024-07-19 DIAGNOSIS — I152 Hypertension secondary to endocrine disorders: Secondary | ICD-10-CM

## 2024-07-19 MED ORDER — FUROSEMIDE 40 MG PO TABS
40.0000 mg | ORAL_TABLET | Freq: Every day | ORAL | 0 refills | Status: DC
  Filled 2024-07-19 – 2024-07-24 (×3): qty 90, 90d supply, fill #0

## 2024-07-19 MED ORDER — RAMIPRIL 10 MG PO CAPS
20.0000 mg | ORAL_CAPSULE | Freq: Every day | ORAL | 0 refills | Status: DC
  Filled 2024-07-19 – 2024-07-24 (×3): qty 180, 90d supply, fill #0

## 2024-07-19 NOTE — Telephone Encounter (Signed)
 RF sent, pt aware NTBS

## 2024-07-20 ENCOUNTER — Other Ambulatory Visit (HOSPITAL_COMMUNITY): Payer: Self-pay

## 2024-07-21 ENCOUNTER — Other Ambulatory Visit (HOSPITAL_COMMUNITY): Payer: Self-pay

## 2024-07-23 ENCOUNTER — Other Ambulatory Visit: Payer: Self-pay

## 2024-07-24 ENCOUNTER — Other Ambulatory Visit (HOSPITAL_COMMUNITY): Payer: Self-pay

## 2024-07-31 DIAGNOSIS — H52223 Regular astigmatism, bilateral: Secondary | ICD-10-CM | POA: Diagnosis not present

## 2024-07-31 DIAGNOSIS — H5213 Myopia, bilateral: Secondary | ICD-10-CM | POA: Diagnosis not present

## 2024-07-31 LAB — OPHTHALMOLOGY REPORT-SCANNED

## 2024-08-27 ENCOUNTER — Encounter: Payer: Self-pay | Admitting: Family Medicine

## 2024-08-27 ENCOUNTER — Other Ambulatory Visit (HOSPITAL_COMMUNITY): Payer: Self-pay

## 2024-08-27 ENCOUNTER — Other Ambulatory Visit: Payer: Self-pay | Admitting: Family Medicine

## 2024-08-27 DIAGNOSIS — E119 Type 2 diabetes mellitus without complications: Secondary | ICD-10-CM

## 2024-08-27 DIAGNOSIS — E1169 Type 2 diabetes mellitus with other specified complication: Secondary | ICD-10-CM

## 2024-08-27 MED ORDER — PITAVASTATIN CALCIUM 2 MG PO TABS
1.0000 | ORAL_TABLET | Freq: Every day | ORAL | 0 refills | Status: DC
  Filled 2024-08-27: qty 90, 90d supply, fill #0

## 2024-08-27 MED ORDER — METFORMIN HCL 500 MG PO TABS
500.0000 mg | ORAL_TABLET | Freq: Two times a day (BID) | ORAL | 0 refills | Status: DC
  Filled 2024-08-27: qty 180, 90d supply, fill #0

## 2024-09-17 ENCOUNTER — Ambulatory Visit: Admitting: Family Medicine

## 2024-09-17 ENCOUNTER — Encounter: Payer: Self-pay | Admitting: Family Medicine

## 2024-09-17 VITALS — BP 119/67 | HR 91 | Temp 98.0°F | Ht 67.0 in | Wt 237.0 lb

## 2024-09-17 DIAGNOSIS — E1159 Type 2 diabetes mellitus with other circulatory complications: Secondary | ICD-10-CM | POA: Diagnosis not present

## 2024-09-17 DIAGNOSIS — I152 Hypertension secondary to endocrine disorders: Secondary | ICD-10-CM

## 2024-09-17 DIAGNOSIS — E785 Hyperlipidemia, unspecified: Secondary | ICD-10-CM | POA: Diagnosis not present

## 2024-09-17 DIAGNOSIS — Z7984 Long term (current) use of oral hypoglycemic drugs: Secondary | ICD-10-CM | POA: Diagnosis not present

## 2024-09-17 DIAGNOSIS — E119 Type 2 diabetes mellitus without complications: Secondary | ICD-10-CM

## 2024-09-17 DIAGNOSIS — E1169 Type 2 diabetes mellitus with other specified complication: Secondary | ICD-10-CM

## 2024-09-17 NOTE — Progress Notes (Signed)
 Subjective: CC:DM PCP: [name] HERO, DO YEP:[name] is a 63 y.o. female presenting to clinic today for:  Type 2 Diabetes with hypertension, hyperlipidemia:  Compliant with all meds but admits that she is not compliant with diet.  She often drinks many Coca-Cola's per day and snacks on high carb foods.  She has had some weight gain.  She knows what to do but just has some difficulty taking care of herself because she is taking care of everyone else.  She reports no chest pain, shortness of breath or visual disturbance.  She does suffer from IBS constipation but has considered GLP treatment   Diabetes Health Maintenance Due  Topic Date Due   FOOT EXAM  03/20/2024   HEMOGLOBIN A1C  12/23/2024   OPHTHALMOLOGY EXAM  07/31/2025    ROS: Per HPI  Allergies[1] Past Medical History:  Diagnosis Date   Allergy    seasonal   Anxiety    Arthritis    Diabetes mellitus    type ii   GERD (gastroesophageal reflux disease)    Hiatal hernia    Hyperlipidemia    Hypertension    Irritable bowel syndrome    Obesity    Sinus infection 08/14/2017   Symptoms resolved after antiobiotic course   Tubular adenoma of colon 05/27/2015   Ulcer    Current Medications[2] Social History   Socioeconomic History   Marital status: Married    Spouse name: Not on file   Number of children: Not on file   Years of education: Not on file   Highest education level: Not on file  Occupational History   Not on file  Tobacco Use   Smoking status: Never   Smokeless tobacco: Never   Tobacco comments:    AS A Teenager  Vaping Use   Vaping status: Never Used  Substance and Sexual Activity   Alcohol use: Yes    Alcohol/week: 0.0 standard drinks of alcohol    Comment:  occasional wine   Drug use: No   Sexual activity: Not on file  Other Topics Concern   Not on file  Social History Narrative   Not on file   Social Drivers of Health   Tobacco Use: Low Risk (09/17/2024)   Patient  History    Smoking Tobacco Use: Never    Smokeless Tobacco Use: Never    Passive Exposure: Not on file  Financial Resource Strain: Not on file  Food Insecurity: Not on file  Transportation Needs: Not on file  Physical Activity: Not on file  Stress: Not on file  Social Connections: Not on file  Intimate Partner Violence: Not on file  Depression (PHQ2-9): Low Risk (10/17/2023)   Depression (PHQ2-9)    PHQ-2 Score: 0  Alcohol Screen: Not on file  Housing: Not on file  Utilities: Not on file  Health Literacy: Not on file   Family History  Problem Relation Age of Onset   Diabetes Mother    Kidney disease Mother    Colitis Father    Colon polyps Father    Heart disease Father    Irritable bowel syndrome Father    Squamous cell carcinoma Father    Crohn's disease Sister    Colitis Sister    Colon polyps Sister    Ulcerative colitis Sister    Diabetes Maternal Aunt    Diabetes Paternal Aunt    AAA (abdominal aortic aneurysm) Paternal Aunt    Diabetes Maternal Grandmother    Diabetes Paternal Grandmother  Heart disease Paternal Grandmother    Colitis Paternal Grandfather    Other Other        corkscrew esophagus- father, sister   Colon cancer Neg Hx    Esophageal cancer Neg Hx    Pancreatic cancer Neg Hx    Rectal cancer Neg Hx    Stomach cancer Neg Hx     Objective: Office vital signs reviewed. BP 119/67   Pulse 91   Temp 98 F (36.7 C)   Ht 5' 7 (1.702 m)   Wt 237 lb (107.5 kg)   LMP 12/26/2014 Comment: 2-3 days  SpO2 95%   BMI 37.12 kg/m   Physical Examination:  General: Awake, alert, well-appearing, morbidly obese female, No acute distress HEENT: Sclera white.  Moist mucous membranes Cardio: regular rate and rhythm, S1S2 heard, no murmurs appreciated Pulm: clear to auscultation bilaterally, no wheezes, rhonchi or rales; normal work of breathing on room air    Diabetic Foot Exam - Simple   Simple Foot Form Diabetic Foot exam was performed with the  following findings: Yes 09/17/2024 12:03 PM  Visual Inspection No deformities, no ulcerations, no other skin breakdown bilaterally: Yes Sensation Testing Intact to touch and monofilament testing bilaterally: Yes Pulse Check Posterior Tibialis and Dorsalis pulse intact bilaterally: Yes Comments     Lab Results  Component Value Date   HGBA1C 6.8 (H) 06/25/2024    Assessment/ Plan: 63 y.o. female   Diabetes mellitus treated with oral medication (HCC) - Plan: Bayer DCA Hb A1c Waived, CMP14+EGFR, CANCELED: Bayer DCA Hb A1c Waived  Hypertension associated with diabetes (HCC) - Plan: CMP14+EGFR  Hyperlipidemia associated with type 2 diabetes mellitus (HCC) - Plan: CMP14+EGFR, Lipid panel  She will repeat fasting labs including A1c.  Future order placed.  We discussed possible GLP and she is going to consider this but it has made a plan to be strict about lifestyle modification.  She will contact me via MyChart should she change her mind about GLP prior to her next visit.   [name] CHRISTELLA Fielding, DO [facility] [phone]     [1]  Allergies Allergen Reactions   Erythromycin Other (See Comments)    Abdominal pain   Codeine Other (See Comments)    Hyper    Sulfa Antibiotics Hives   Tetracycline Rash  [2]  Current Outpatient Medications:    aspirin EC 81 MG tablet, Take 81 mg by mouth daily., Disp: , Rfl:    Blood Glucose Monitoring Suppl (FREESTYLE FREEDOM LITE) w/Device KIT, Test blood sugar BID Dx E11.9, Disp: 1 kit, Rfl: 0   furosemide  (LASIX ) 40 MG tablet, Take 1 tablet (40 mg total) by mouth daily., Disp: 90 tablet, Rfl: 0   glucose blood (FREESTYLE LITE) test strip, Use to test blood sugar 2 times daily, Disp: 200 each, Rfl: 3   ibuprofen (ADVIL) 200 MG tablet, Take 200 mg by mouth every 6 (six) hours as needed., Disp: , Rfl:    Lancets (FREESTYLE) lancets, use to test blood sugar two times daily, Disp: 200 each, Rfl: 3   metFORMIN  (GLUCOPHAGE )  500 MG tablet, Take 1 tablet (500 mg total) by mouth 2 (two) times daily., Disp: 180 tablet, Rfl: 0   multivitamin-iron-minerals-folic acid (CENTRUM) chewable tablet, Chew 1 tablet by mouth daily., Disp: , Rfl:    omeprazole  (PRILOSEC) 20 MG capsule, Take 20 mg by mouth daily., Disp: , Rfl:    Pitavastatin  Calcium  (LIVALO ) 2 MG TABS, Take 1 tablet (2 mg total) by  mouth at bedtime., Disp: 90 tablet, Rfl: 0   ramipril  (ALTACE ) 10 MG capsule, Take 2 capsules (20 mg total) by mouth daily., Disp: 180 capsule, Rfl: 0   ALPRAZolam  (XANAX ) 0.5 MG tablet, Take 1 tablet by mouth 2 times daily as needed for anxiety (flight). (Patient not taking: Reported on 09/17/2024), Disp: 20 tablet, Rfl: 0   azithromycin  (ZITHROMAX  Z-PAK) 250 MG tablet, As directed (Patient not taking: Reported on 09/17/2024), Disp: 6 tablet, Rfl: 0   Vitamin D , Ergocalciferol , (DRISDOL ) 1.25 MG (50000 UNIT) CAPS capsule, Take 1 capsule (50,000 Units total) by mouth every 7 (seven) days. (Patient not taking: Reported on 09/17/2024), Disp: 12 capsule, Rfl: 3

## 2024-10-02 ENCOUNTER — Other Ambulatory Visit (HOSPITAL_COMMUNITY): Payer: Self-pay

## 2024-10-02 ENCOUNTER — Ambulatory Visit (INDEPENDENT_AMBULATORY_CARE_PROVIDER_SITE_OTHER)

## 2024-10-02 ENCOUNTER — Other Ambulatory Visit: Payer: Self-pay | Admitting: Family Medicine

## 2024-10-02 DIAGNOSIS — Z23 Encounter for immunization: Secondary | ICD-10-CM | POA: Diagnosis not present

## 2024-10-02 DIAGNOSIS — I152 Hypertension secondary to endocrine disorders: Secondary | ICD-10-CM

## 2024-10-02 DIAGNOSIS — E119 Type 2 diabetes mellitus without complications: Secondary | ICD-10-CM

## 2024-10-02 DIAGNOSIS — E1169 Type 2 diabetes mellitus with other specified complication: Secondary | ICD-10-CM

## 2024-10-02 MED ORDER — PITAVASTATIN CALCIUM 2 MG PO TABS
1.0000 | ORAL_TABLET | Freq: Every day | ORAL | 0 refills | Status: DC
  Filled 2024-10-02: qty 90, 90d supply, fill #0

## 2024-10-02 MED ORDER — FUROSEMIDE 40 MG PO TABS
40.0000 mg | ORAL_TABLET | Freq: Every day | ORAL | 0 refills | Status: DC
  Filled 2024-10-02: qty 90, 90d supply, fill #0

## 2024-10-02 MED ORDER — PITAVASTATIN CALCIUM 2 MG PO TABS
1.0000 | ORAL_TABLET | Freq: Every day | ORAL | 3 refills | Status: AC
  Filled 2024-10-02: qty 90, 90d supply, fill #0

## 2024-10-02 MED ORDER — RAMIPRIL 10 MG PO CAPS
20.0000 mg | ORAL_CAPSULE | Freq: Every day | ORAL | 3 refills | Status: AC
  Filled 2024-10-02 – 2024-10-18 (×2): qty 180, 90d supply, fill #0

## 2024-10-02 MED ORDER — RAMIPRIL 10 MG PO CAPS
20.0000 mg | ORAL_CAPSULE | Freq: Every day | ORAL | 0 refills | Status: DC
  Filled 2024-10-02: qty 180, 90d supply, fill #0

## 2024-10-02 MED ORDER — METFORMIN HCL 500 MG PO TABS
500.0000 mg | ORAL_TABLET | Freq: Two times a day (BID) | ORAL | 3 refills | Status: AC
  Filled 2024-10-02: qty 180, 90d supply, fill #0

## 2024-10-02 MED ORDER — METFORMIN HCL 500 MG PO TABS
500.0000 mg | ORAL_TABLET | Freq: Two times a day (BID) | ORAL | 0 refills | Status: DC
  Filled 2024-10-02: qty 180, 90d supply, fill #0

## 2024-10-02 MED ORDER — FUROSEMIDE 40 MG PO TABS
40.0000 mg | ORAL_TABLET | Freq: Every day | ORAL | 3 refills | Status: AC
  Filled 2024-10-02 – 2024-10-18 (×2): qty 90, 90d supply, fill #0

## 2024-10-02 NOTE — Progress Notes (Signed)
 Patient is in office today for a nurse visit for Tdap Immunization. Patient Injection was given in the  Right deltoid. Patient tolerated injection well.

## 2024-10-18 ENCOUNTER — Other Ambulatory Visit (HOSPITAL_COMMUNITY): Payer: Self-pay

## 2024-10-18 ENCOUNTER — Other Ambulatory Visit: Payer: Self-pay

## 2024-11-04 ENCOUNTER — Encounter: Payer: Self-pay | Admitting: Family Medicine

## 2024-11-05 ENCOUNTER — Encounter: Payer: Self-pay | Admitting: Family

## 2024-11-05 ENCOUNTER — Telehealth: Admitting: Family

## 2024-11-05 DIAGNOSIS — T3695XA Adverse effect of unspecified systemic antibiotic, initial encounter: Secondary | ICD-10-CM

## 2024-11-05 DIAGNOSIS — R399 Unspecified symptoms and signs involving the genitourinary system: Secondary | ICD-10-CM | POA: Diagnosis not present

## 2024-11-05 DIAGNOSIS — B379 Candidiasis, unspecified: Secondary | ICD-10-CM | POA: Diagnosis not present

## 2024-11-05 DIAGNOSIS — R300 Dysuria: Secondary | ICD-10-CM | POA: Diagnosis not present

## 2024-11-05 MED ORDER — FLUCONAZOLE 150 MG PO TABS: 150.0000 mg | ORAL_TABLET | ORAL | 0 refills | Status: AC

## 2024-11-05 MED ORDER — CEPHALEXIN 500 MG PO CAPS: 500.0000 mg | ORAL_CAPSULE | Freq: Two times a day (BID) | ORAL | 0 refills | Status: AC
